# Patient Record
Sex: Female | Born: 1955
Health system: Southern US, Community
[De-identification: ages and names within clinical notes are randomized; demographics above are authoritative.]

## PROBLEM LIST (undated history)

## (undated) DIAGNOSIS — G4730 Sleep apnea, unspecified: Secondary | ICD-10-CM

## (undated) DIAGNOSIS — IMO0001 Reserved for inherently not codable concepts without codable children: Secondary | ICD-10-CM

## (undated) DIAGNOSIS — G629 Polyneuropathy, unspecified: Secondary | ICD-10-CM

## (undated) DIAGNOSIS — Z9889 Other specified postprocedural states: Secondary | ICD-10-CM

## (undated) DIAGNOSIS — J45909 Unspecified asthma, uncomplicated: Secondary | ICD-10-CM

## (undated) DIAGNOSIS — K219 Gastro-esophageal reflux disease without esophagitis: Secondary | ICD-10-CM

## (undated) DIAGNOSIS — E669 Obesity, unspecified: Secondary | ICD-10-CM

## (undated) DIAGNOSIS — R112 Nausea with vomiting, unspecified: Secondary | ICD-10-CM

## (undated) DIAGNOSIS — D649 Anemia, unspecified: Secondary | ICD-10-CM

## (undated) DIAGNOSIS — E785 Hyperlipidemia, unspecified: Secondary | ICD-10-CM

## (undated) DIAGNOSIS — J40 Bronchitis, not specified as acute or chronic: Secondary | ICD-10-CM

## (undated) DIAGNOSIS — H3530 Unspecified macular degeneration: Secondary | ICD-10-CM

## (undated) DIAGNOSIS — J309 Allergic rhinitis, unspecified: Secondary | ICD-10-CM

## (undated) DIAGNOSIS — R109 Unspecified abdominal pain: Secondary | ICD-10-CM

## (undated) DIAGNOSIS — F32A Depression, unspecified: Secondary | ICD-10-CM

## (undated) DIAGNOSIS — I10 Essential (primary) hypertension: Secondary | ICD-10-CM

## (undated) DIAGNOSIS — M1990 Unspecified osteoarthritis, unspecified site: Secondary | ICD-10-CM

## (undated) DIAGNOSIS — F419 Anxiety disorder, unspecified: Secondary | ICD-10-CM

## (undated) HISTORY — PX: HAMMER TOE SURGERY: SHX385

## (undated) HISTORY — DX: Allergic rhinitis, unspecified: J30.9

## (undated) HISTORY — DX: Sleep apnea, unspecified: G47.30

## (undated) HISTORY — DX: Obesity, unspecified: E66.9

## (undated) HISTORY — DX: Anxiety disorder, unspecified: F41.9

## (undated) HISTORY — PX: ABDOMINAL HYSTERECTOMY: SHX81

## (undated) HISTORY — DX: Anemia, unspecified: D64.9

## (undated) HISTORY — PX: JOINT REPLACEMENT: SHX530

## (undated) HISTORY — PX: BREAST REDUCTION SURGERY: SHX8

## (undated) HISTORY — DX: Bronchitis, not specified as acute or chronic: J40

## (undated) HISTORY — PX: NOSE SURGERY: SHX723

## (undated) HISTORY — DX: Unspecified abdominal pain: R10.9

## (undated) HISTORY — DX: Hyperlipidemia, unspecified: E78.5

## (undated) HISTORY — PX: OTHER SURGICAL HISTORY: SHX169

## (undated) HISTORY — DX: Gastro-esophageal reflux disease without esophagitis: K21.9

## (undated) HISTORY — DX: Polyneuropathy, unspecified: G62.9

---

## 1998-01-07 ENCOUNTER — Encounter: Payer: Self-pay | Admitting: *Deleted

## 1998-01-07 ENCOUNTER — Ambulatory Visit (HOSPITAL_COMMUNITY): Admission: RE | Admit: 1998-01-07 | Discharge: 1998-01-07 | Payer: Self-pay | Admitting: *Deleted

## 1999-04-12 ENCOUNTER — Emergency Department (HOSPITAL_COMMUNITY): Admission: EM | Admit: 1999-04-12 | Discharge: 1999-04-12 | Payer: Self-pay

## 1999-04-13 ENCOUNTER — Emergency Department (HOSPITAL_COMMUNITY): Admission: EM | Admit: 1999-04-13 | Discharge: 1999-04-13 | Payer: Self-pay | Admitting: Emergency Medicine

## 1999-04-14 ENCOUNTER — Emergency Department (HOSPITAL_COMMUNITY): Admission: EM | Admit: 1999-04-14 | Discharge: 1999-04-14 | Payer: Self-pay

## 1999-07-30 ENCOUNTER — Other Ambulatory Visit: Admission: RE | Admit: 1999-07-30 | Discharge: 1999-07-30 | Payer: Self-pay | Admitting: Obstetrics & Gynecology

## 2001-03-28 ENCOUNTER — Emergency Department (HOSPITAL_COMMUNITY): Admission: EM | Admit: 2001-03-28 | Discharge: 2001-03-28 | Payer: Self-pay | Admitting: Emergency Medicine

## 2001-03-28 ENCOUNTER — Encounter: Payer: Self-pay | Admitting: Emergency Medicine

## 2003-12-06 ENCOUNTER — Ambulatory Visit (HOSPITAL_COMMUNITY): Admission: RE | Admit: 2003-12-06 | Discharge: 2003-12-06 | Payer: Self-pay | Admitting: Gastroenterology

## 2004-11-10 ENCOUNTER — Other Ambulatory Visit: Admission: RE | Admit: 2004-11-10 | Discharge: 2004-11-10 | Payer: Self-pay | Admitting: Obstetrics & Gynecology

## 2004-11-25 ENCOUNTER — Encounter: Admission: RE | Admit: 2004-11-25 | Discharge: 2004-11-25 | Payer: Self-pay | Admitting: Orthopedic Surgery

## 2008-10-17 ENCOUNTER — Encounter: Payer: Self-pay | Admitting: Pulmonary Disease

## 2009-07-10 ENCOUNTER — Encounter: Payer: Self-pay | Admitting: Pulmonary Disease

## 2009-08-19 DIAGNOSIS — I10 Essential (primary) hypertension: Secondary | ICD-10-CM | POA: Insufficient documentation

## 2009-08-19 DIAGNOSIS — K219 Gastro-esophageal reflux disease without esophagitis: Secondary | ICD-10-CM | POA: Insufficient documentation

## 2009-08-19 DIAGNOSIS — M545 Low back pain, unspecified: Secondary | ICD-10-CM | POA: Insufficient documentation

## 2009-08-19 DIAGNOSIS — M542 Cervicalgia: Secondary | ICD-10-CM | POA: Insufficient documentation

## 2009-08-19 DIAGNOSIS — E785 Hyperlipidemia, unspecified: Secondary | ICD-10-CM | POA: Insufficient documentation

## 2009-08-19 DIAGNOSIS — I110 Hypertensive heart disease with heart failure: Secondary | ICD-10-CM | POA: Insufficient documentation

## 2009-08-19 DIAGNOSIS — E119 Type 2 diabetes mellitus without complications: Secondary | ICD-10-CM | POA: Insufficient documentation

## 2009-08-19 DIAGNOSIS — L299 Pruritus, unspecified: Secondary | ICD-10-CM | POA: Insufficient documentation

## 2009-08-19 DIAGNOSIS — D649 Anemia, unspecified: Secondary | ICD-10-CM | POA: Insufficient documentation

## 2009-08-19 DIAGNOSIS — F411 Generalized anxiety disorder: Secondary | ICD-10-CM | POA: Insufficient documentation

## 2009-08-20 ENCOUNTER — Ambulatory Visit: Payer: Self-pay | Admitting: Pulmonary Disease

## 2009-08-20 DIAGNOSIS — J328 Other chronic sinusitis: Secondary | ICD-10-CM | POA: Insufficient documentation

## 2009-08-21 ENCOUNTER — Ambulatory Visit: Payer: Self-pay | Admitting: Internal Medicine

## 2009-09-01 ENCOUNTER — Telehealth (INDEPENDENT_AMBULATORY_CARE_PROVIDER_SITE_OTHER): Payer: Self-pay | Admitting: *Deleted

## 2009-09-11 ENCOUNTER — Ambulatory Visit: Payer: Self-pay | Admitting: Pulmonary Disease

## 2009-09-17 ENCOUNTER — Encounter: Payer: Self-pay | Admitting: Pulmonary Disease

## 2009-12-08 ENCOUNTER — Encounter: Payer: Self-pay | Admitting: Pulmonary Disease

## 2010-04-14 NOTE — Assessment & Plan Note (Signed)
Summary: rov for ongoing sinus issues   Copy to:  [facility] Primary Provider/Referring Provider:  Elvera Lennox  CC:  Pt is here for a routine f/u appt.   Pt states she is feeling " no better" from last visit.  Pt still c/o head congestion and PND.  Pt denied sore throat and headache or cough.  .  History of Present Illness: the pt comes in today for f/u of her upper airway cough with ongoing nasal symptoms.  At the last visit, she had a cxr and ct sinuses that were unremarkable.  She was asked to work on a nasal hygiene regimen, and to followup to check on her progress.  She comes in today where she continues to have nasal and head congestion, and postnasal drip.  She denies any cough currently.  Current Medications (verified): 1)  Multivitamins  Tabs (Multiple Vitamin) .... Take 1 Tablet By Mouth Once A Day 2)  Norvasc 10 Mg Tabs (Amlodipine Besylate) .... Take 1 Tablet By Mouth Once A Day 3)  Lipitor 40 Mg Tabs (Atorvastatin Calcium) .... Take 1 Tablet By Mouth Once A Day 4)  Singulair 10 Mg Tabs (Montelukast Sodium) .... Take 1 Tablet By Mouth Once A Day As Needed 5)  Diclofenac Sodium 75 Mg Tbec (Diclofenac Sodium) .... Take 1 Tablet By Mouth Two Times A Day As Needed 6)  Prevacid 30 Mg Cpdr (Lansoprazole) .... Take 1 Tablet By Mouth Once A Day 7)  Lexapro 10 Mg Tabs (Escitalopram Oxalate) .... Take 1 Tablet By Mouth Once A Day 8)  Astepro 0.15 % Soln (Azelastine Hcl) .... As Directed 9)  Chlorpheniramine Maleate 4 Mg Tabs (Chlorpheniramine Maleate) .... Take 2 Tabs By Mouth At Bedtime 10)  Neilmed Sinus Rinses .... Use Two Times A Day  Allergies (verified): No Known Drug Allergies  Review of Systems       The patient complains of nasal congestion/difficulty breathing through nose.  The patient denies shortness of breath with activity, shortness of breath at rest, productive cough, non-productive cough, coughing up blood, chest pain, irregular heartbeats, acid heartburn, indigestion,  loss of appetite, weight change, abdominal pain, difficulty swallowing, sore throat, tooth/dental problems, headaches, sneezing, itching, ear ache, anxiety, depression, hand/feet swelling, joint stiffness or pain, rash, change in color of mucus, and fever.    Vital Signs:  Patient profile:   55 year old female Height:      71 inches Weight:      317 pounds BMI:     44.37 O2 Sat:      97 % on Room air Temp:     99.0 degrees F oral Pulse rate:   90 / minute BP sitting:   122 / 70  (left arm) Cuff size:   large  Vitals Entered By: Arman Filter LPN (September 11, 2009 9:17 AM)  O2 Flow:  Room air CC: Pt is here for a routine f/u appt.   Pt states she is feeling " no better" from last visit.  Pt still c/o head congestion and PND.  Pt denied sore throat, headache or cough.   Comments Medications reviewed with patient Arman Filter LPN  September 11, 2009 9:17 AM    Physical Exam  General:  obese female in nad very nasal voice with sniffing Nose:  enlarged turbs, inflamm changes on left with mild bleeding. Mouth:  clear Lungs:  totally clear to auscultation Heart:  rrr, no mrg Extremities:  no edema or cyanosis Neurologic:  alert and oriented, moves  all 4   Impression & Recommendations:  Problem # 1:  RHINOSINUSITIS, CHRONIC (ICD-473.8) the pt continues to have chronic nasal congestion with postnasal drip.  Surprisingly, her ct showed no sinusitis, and she only had partial response to a regimen of chlorpheniramine/astelin/neil med rinses.  She has had allergy testing with was unremarkable.  At this point, I wonder if she has vasomotor rhinitis superimposed over structural issues?  I think she needs ENT evaluation, and will make the referral.  I really do not see an ongoing pulmonary issue here.  Medications Added to Medication List This Visit: 1)  Chlorpheniramine Maleate 4 Mg Tabs (Chlorpheniramine maleate) .... Take 2 tabs by mouth at bedtime 2)  Neilmed Sinus Rinses  .... Use two times  a day  Other Orders: Est. Patient Level III (13086) ENT Referral (ENT)  Patient Instructions: 1)  continue on chlorpheniramine at bedtime as before, along with neilmed rinses. 2)  will refer to ENT for evaluation of your nasal passages and sinuses. 3)  followup with me as needed.   Immunization History:  Influenza Immunization History:    Influenza:  historical (12/13/2008)  Pneumovax Immunization History:    Pneumovax:  historical (03/15/2006)

## 2010-04-14 NOTE — Letter (Signed)
Summary: [facility]  [facility]   Imported By: Sherian Rein 01/19/2010 12:39:16  _____________________________________________________________________  External Attachment:    Type:   Image     Comment:   External Document

## 2010-04-14 NOTE — Progress Notes (Signed)
Summary: rx  Phone Note Call from Patient Call back at Work Phone [phone]   Caller: Patient Call For: clance Reason for Call: Talk to Nurse Summary of Call: pt coughing, post nasal drip.  Can she get an antibiotic called in? [facility] [place] Initial call taken by: Eugene Gavia,  September 01, 2009 9:53 AM  Follow-up for Phone Call        Spoke with pt.  Pt c/o sinus congstion, yellow mucus, PND, nonprod cough at night, and fever on Friday and Saturday.  Pt requesting abx to be called in because she was just seen on 08/20/09 and has pending appt on 09/11/09 with [facility]. NKDA,  [facility].   Will forward message to KC-pls advise.  Thanks!  Gweneth Dimitri RN  September 01, 2009 10:38 AM   Additional Follow-up for Phone Call Additional follow up Details #1::        she had a ct scan of her sinuses just 10 days ago that was completely clear....very unlikely she has a sinus infection in this short period of time.  Make sure she is doing her rinses am and pm, using her nasal spray, and taking her chlorpheniramine at hs.  last visit, she was not compliant with her medications If she says she is being compliant (see d/c instructions last visit), we can call in short prednisone burst....let me know. Additional Follow-up by: Barbaraann Share MD,  September 01, 2009 4:37 PM    Additional Follow-up for Phone Call Additional follow up Details #2::    Spoke with pt and notified of the above recs per [facility].  Pt states that she is taking the chlopheneramine at hs, nasal saline rinses two times a day and is also taking the astepro as directed.  Please advise directions for pred taper, thanks! Follow-up by: Vernie Murders,  September 01, 2009 4:44 PM  Additional Follow-up for Phone Call Additional follow up Details #3:: Details for Additional Follow-up Action Taken: prednisone 20mg  2 once daily for 2 days, then 1 1/2 once daily for 2 days, then 1 once daily for 2 days,then 1/2 once daily for 2 days,then stop.  if issues  continue, let her know will send to ent for evaluation.  Rx for prednisone taper was sent to pharm and pt aware of this and also aware that if continues to have issues will need ENT referral. Vernie Murders  September 01, 2009 4:56 PM  Additional Follow-up by: Barbaraann Share MD,  September 01, 2009 4:52 PM  New/Updated Medications: PREDNISONE 20 MG TABS (PREDNISONE) 2 each am x 2 days, 1 1/2 each am x 2 days, 1 x 2 days, then 1/2 x 2 days, then stop. Prescriptions: PREDNISONE 20 MG TABS (PREDNISONE) 2 each am x 2 days, 1 1/2 each am x 2 days, 1 x 2 days, then 1/2 x 2 days, then stop.  #10 x 0   Entered by:   Vernie Murders   Authorized by:   Barbaraann Share MD   Signed by:   Vernie Murders on 09/01/2009   Method used:   [facility]        [facility] [phone]* (retail)       [address]       [place], [place]  [address]       Ph: [phone] or [phone]       Fax: [phone]   RxID:   [id number]

## 2010-04-14 NOTE — Letter (Signed)
[facility]   Imported By: Lester Graham 08/26/2009 13:45:41  _____________________________________________________________________  External Attachment:    Type:   Image     Comment:   External Document

## 2010-04-14 NOTE — Consult Note (Signed)
Summary: [facility]  [facility]   Imported By: Sherian Rein 12/03/2009 08:56:52  _____________________________________________________________________  External Attachment:    Type:   Image     Comment:   External Document

## 2010-04-14 NOTE — Assessment & Plan Note (Signed)
Summary: consult for cough, rhinosinusitis   Copy to:  [facility] Primary Provider/Referring Provider:  Elvera Lennox  CC:  Pulmonary Consult.  History of Present Illness: The pt is a 55y/o female who I have been asked to see for persistent cough and sinus congestion.  The pt has had issues with cough off and on, but today tells me this has totally resolved since her "blood pressure medication has been changed".  She is having persistent nasal congestion and rhinorrhea, and tells me that she has recurrent episodes of "bronchitis".  She is currently blowing purulent material from nares.  She has not seen improvement with singulair, nasonex, or astelin, but admits she doesn't take them consistently enough to see if they will help.  She has had an allergy evaluation, with only minimal reactivity to dust mite and weed mixture.  Attempts have been made to do spirometry, but the pt has poor test performance.  She also has a h/o GERD and takes prevacid, and only has rare breakthru.  She has doe with more moderate to heavy activities, but no issues with ADL's.    Preventive Screening-Counseling & Management  Alcohol-Tobacco     Smoking Status: quit  Current Medications (verified): 1)  Multivitamins  Tabs (Multiple Vitamin) .... Take 1 Tablet By Mouth Once A Day 2)  Norvasc 10 Mg Tabs (Amlodipine Besylate) .... Take 1 Tablet By Mouth Once A Day 3)  Lipitor 40 Mg Tabs (Atorvastatin Calcium) .... Take 1 Tablet By Mouth Once A Day 4)  Singulair 10 Mg Tabs (Montelukast Sodium) .... Take 1 Tablet By Mouth Once A Day As Needed 5)  Diclofenac Sodium 75 Mg Tbec (Diclofenac Sodium) .... Take 1 Tablet By Mouth Two Times A Day As Needed 6)  Prevacid 30 Mg Cpdr (Lansoprazole) .... Take 1 Tablet By Mouth Once A Day 7)  Lexapro 10 Mg Tabs (Escitalopram Oxalate) .... Take 1 Tablet By Mouth Once A Day  Allergies (verified): No Known Drug Allergies  Past History:  Past Medical History:  ANEMIA  (ICD-285.9) DIABETES MELLITUS (ICD-250.00) CERVICALGIA (ICD-723.1) LOW BACK PAIN, CHRONIC (ICD-724.2) HYPERLIPIDEMIA (ICD-272.4) GERD (ICD-530.81) HYPERTENSION (ICD-401.9) ANXIETY (ICD-300.00) PRURITUS (ICD-698.9)    Past Surgical History: hysterectomy at age 73 breast reduction age 32s L knee surgery 48s  Family History: Reviewed history and no changes required. allergies: mother  Social History: Reviewed history and no changes required. Patient states former smoker.   started at age 20.  less than 1 ppd.  quit 2008. pt is married to Keyser. pt has no children. pt works as an Investment banker, corporate for [employer] Smoking Status:  quit  Review of Systems       The patient complains of shortness of breath with activity, productive cough, nasal congestion/difficulty breathing through nose, sneezing, hand/feet swelling, and change in color of mucus.  The patient denies shortness of breath at rest, non-productive cough, coughing up blood, chest pain, irregular heartbeats, acid heartburn, indigestion, loss of appetite, weight change, abdominal pain, difficulty swallowing, sore throat, tooth/dental problems, headaches, itching, ear ache, anxiety, depression, joint stiffness or pain, rash, and fever.    Vital Signs:  Patient profile:   55 year old female Height:      71 inches Weight:      328 pounds BMI:     45.91 O2 Sat:      93 % on Room air Temp:     98.9 degrees F oral Pulse rate:   96 / minute BP sitting:   124 /  80  (left arm) Cuff size:   regular  Vitals Entered By: Arman Filter LPN (August 20, 1608 3:35 PM)  O2 Flow:  Room air CC: Pulmonary Consult Comments Medications reviewed with patient Arman Filter LPN  August 21, 9602 3:45 PM    Physical Exam  General:  obese female in nad Eyes:  PERRLA and EOMI.   Nose:  swollen turbinates, inflammed mucosa, bleeding and crusting noted in passageway Mouth:  elongation of soft palate and uvula, otherwise  clear Neck:  no jvd, tmg, LN Lungs:  totally clear to auscultation, no wheezing or rhonchi Heart:  rrr, no mrg Abdomen:  soft and nontender, bs+ Extremities:  mild edema, no cyanosis pulses intake distally Neurologic:  alert and oriented, moves all 4.   Impression & Recommendations:  Problem # 1:  RHINOSINUSITIS, CHRONIC (ICD-473.8) the pt is having chronic nasal congestion with purulence, postnasal drip, and waxing and waning cough that has been unresponsive to abx, steroids, and an allergy regimen for rhinitis.  I am wondering  with her recurrent "bronchitis", if she actually has chronic sinusitis that will require broader abx treatment and for a longer period of time.  I will check cxr and scan of sinuses to evaluate for this.  I also would consider whether LPR could be playing a role in all of this?  I cannot 100% exclude the possibility of asthma (she was not able to perform spirometry), but there is nothing by history or exam to support this.  Medications Added to Medication List This Visit: 1)  Norvasc 10 Mg Tabs (Amlodipine besylate) .... Take 1 tablet by mouth once a day 2)  Singulair 10 Mg Tabs (Montelukast sodium) .... Take 1 tablet by mouth once a day as needed 3)  Diclofenac Sodium 75 Mg Tbec (Diclofenac sodium) .... Take 1 tablet by mouth two times a day as needed 4)  Astepro 0.15 % Soln (Azelastine hcl) .... As directed  Other Orders: Consultation Level IV (54098) Radiology Referral (Radiology) T-2 View CXR (71020TC)  Patient Instructions: 1)  neilmed sinus rinses am and pm 2)  stop singulair 3)  astepro 2 sprays each nostril each am, after your rinses. 4)  will check xray of sinuses to make sure you do not have sinusitis. 5)  will check cxr today. 6)  take chlorpheniramine 8mg  at bedtime each night. 7)  will arrange for followup apptm once resuts back.  Prescriptions: ASTEPRO 0.15 % SOLN (AZELASTINE HCL) as directed  #1 x 6   Entered and Authorized by:   Barbaraann Share MD   Signed by:   Barbaraann Share MD on 08/20/2009   Method used:   Print then Give to Patient   RxID:   [id number]     CardioPerfect Spirometry  ID: [id number] Patient: Alexandria Ruiz DOB: [birth year] Age: 55 Years Old Sex: Female Race: Black Physician: Bo Teicher Height: 71 Weight: 328 Status: Unconfirmed Past Medical History:  Current Problems:  ANEMIA (ICD-285.9) DIABETES MELLITUS (ICD-250.00) CERVICALGIA (ICD-723.1) LOW BACK PAIN, CHRONIC (ICD-724.2) HYPERLIPIDEMIA (ICD-272.4) GERD (ICD-530.81) HYPERTENSION (ICD-401.9) ANXIETY (ICD-300.00) PRURITUS (ICD-698.9)   Recorded: 08/20/2009 4:27 PM  Parameter  Measured Predicted %Predicted FVC     3.21        3.69        87 FEV1     1.76        2.95        59.50 FEV1%   54.71        81.26  67.30 PEF    3.33        7.32        45.50   Interpretation: results are unreliable and uninterpretable poor test performance

## 2010-04-14 NOTE — Miscellaneous (Signed)
Summary[facility]  [facility]   Imported By: Lester Burkburnett 08/26/2009 13:47:42  _____________________________________________________________________  External Attachment:    Type:   Image     Comment:   External Document

## 2010-04-14 NOTE — Letter (Signed)
[facility]: [facility]  [facility]   Imported By: Lester [place] 08/26/2009 13:44:03  _____________________________________________________________________  External Attachment:    Type:   Image     Comment:   External Document

## 2010-07-31 NOTE — Op Note (Signed)
NAMEKAMICA, FLORANCE            ACCOUNT NO.:  [id number]   MEDICAL RECORD NO.:  [id number]          PATIENT TYPE:  AMB   LOCATION:  ENDO                         FACILITY:  [facility]   PHYSICIAN:  John C. Madilyn Fireman, M.D.    DATE OF BIRTH:  August 27, 1955   DATE OF PROCEDURE:  12/06/2003  DATE OF DISCHARGE:                                 OPERATIVE REPORT   PROCEDURE:  Esophagogastroduodenoscopy.   INDICATION FOR PROCEDURE:  Chronic reflux symptoms refractory to double-dose  proton pump inhibitor.   DESCRIPTION OF PROCEDURE:  The patient was placed in the left lateral  decubitus position and placed on the pulse monitor with continuous low-flow  oxygen delivered by nasal cannula.  She was sedated with 75 mcg IV fentanyl  and 6 mg IV Versed.  The Olympus video endoscope was advanced under direct  vision into the oropharynx and esophagus.  The esophagus was straight and of  normal caliber with the squamocolumnar line at 38 cm above a 2 cm sliding  hiatal hernia.  The GE junction appeared somewhat patulous, and there was  free reflux noted during the procedure.  There were no visible erosions and  no esophageal ulcer, ring, stricture, or other abnormality of the GE  junction.  The stomach was entered, and a small amount of liquid secretions  were suctioned from the fundus.  Retroflexed view of the cardia confirmed a  hiatal hernia and was otherwise unremarkable.  The fundus, body, antrum, and  pylorus all appeared normal.  The duodenum was entered, and both the bulb  and second portion were well-inspected and appeared to be within normal  limits.  The scope was then withdrawn, and the patient returned to the  recovery room in stable condition.  She tolerated the procedure well, and  there were no immediate complications.   IMPRESSION:  1.  Patulous gastroesophageal junction with 2 cm hiatal hernia.  2.  Otherwise, normal study.   PLAN:  Optimize dietary and lifestyle modification for reflux  with continued  double dose proton pump inhibitor and emphasis on weight loss.  She would be  a reasonable candidate for antireflux surgery, but an earnest trial  attempted weight loss would be preferred first.      JCH/MEDQ  D:  12/06/2003  T:  12/06/2003  Job:  [id number]   cc:   Gerrit Friends. Aldona Bar, M.D.  [address]  [place]  [place] [address]  Fax: [phone]

## 2011-06-29 ENCOUNTER — Encounter (HOSPITAL_COMMUNITY)
Admission: EM | Disposition: A | Discharge status: Home / Self Care | Payer: Self-pay | Source: Home / Self Care | Attending: Orthopedic Surgery

## 2011-06-29 ENCOUNTER — Inpatient Hospital Stay (HOSPITAL_COMMUNITY)
Admission: EM | Admit: 2011-06-29 | Discharge: 2011-07-02 | DRG: 493 | Disposition: A | Discharge status: Home / Self Care | Payer: 59 | Attending: Orthopedic Surgery | Admitting: Orthopedic Surgery

## 2011-06-29 ENCOUNTER — Other Ambulatory Visit: Payer: Self-pay | Admitting: Orthopedic Surgery

## 2011-06-29 ENCOUNTER — Emergency Department (HOSPITAL_COMMUNITY): Payer: 59 | Admitting: Anesthesiology

## 2011-06-29 ENCOUNTER — Encounter (HOSPITAL_COMMUNITY): Payer: Self-pay | Admitting: Anesthesiology

## 2011-06-29 ENCOUNTER — Inpatient Hospital Stay: Admit: 2011-06-29 | Payer: 59 | Admitting: Orthopedic Surgery

## 2011-06-29 ENCOUNTER — Emergency Department (HOSPITAL_COMMUNITY): Payer: 59

## 2011-06-29 ENCOUNTER — Encounter (HOSPITAL_COMMUNITY): Payer: Self-pay

## 2011-06-29 DIAGNOSIS — M545 Low back pain, unspecified: Secondary | ICD-10-CM | POA: Diagnosis present

## 2011-06-29 DIAGNOSIS — K219 Gastro-esophageal reflux disease without esophagitis: Secondary | ICD-10-CM | POA: Diagnosis present

## 2011-06-29 DIAGNOSIS — S82899A Other fracture of unspecified lower leg, initial encounter for closed fracture: Secondary | ICD-10-CM

## 2011-06-29 DIAGNOSIS — E119 Type 2 diabetes mellitus without complications: Secondary | ICD-10-CM | POA: Diagnosis present

## 2011-06-29 DIAGNOSIS — S93429A Sprain of deltoid ligament of unspecified ankle, initial encounter: Secondary | ICD-10-CM | POA: Diagnosis present

## 2011-06-29 DIAGNOSIS — E785 Hyperlipidemia, unspecified: Secondary | ICD-10-CM | POA: Diagnosis present

## 2011-06-29 DIAGNOSIS — S82891A Other fracture of right lower leg, initial encounter for closed fracture: Secondary | ICD-10-CM | POA: Diagnosis present

## 2011-06-29 DIAGNOSIS — G4733 Obstructive sleep apnea (adult) (pediatric): Secondary | ICD-10-CM | POA: Diagnosis present

## 2011-06-29 DIAGNOSIS — D649 Anemia, unspecified: Secondary | ICD-10-CM | POA: Diagnosis present

## 2011-06-29 DIAGNOSIS — Y92009 Unspecified place in unspecified non-institutional (private) residence as the place of occurrence of the external cause: Secondary | ICD-10-CM

## 2011-06-29 DIAGNOSIS — I10 Essential (primary) hypertension: Secondary | ICD-10-CM | POA: Diagnosis present

## 2011-06-29 DIAGNOSIS — D62 Acute posthemorrhagic anemia: Secondary | ICD-10-CM | POA: Diagnosis not present

## 2011-06-29 DIAGNOSIS — W010XXA Fall on same level from slipping, tripping and stumbling without subsequent striking against object, initial encounter: Secondary | ICD-10-CM | POA: Diagnosis present

## 2011-06-29 DIAGNOSIS — S8263XA Displaced fracture of lateral malleolus of unspecified fibula, initial encounter for closed fracture: Secondary | ICD-10-CM | POA: Diagnosis present

## 2011-06-29 DIAGNOSIS — S92109A Unspecified fracture of unspecified talus, initial encounter for closed fracture: Principal | ICD-10-CM | POA: Diagnosis present

## 2011-06-29 HISTORY — DX: Other specified postprocedural states: Z98.890

## 2011-06-29 HISTORY — DX: Nausea with vomiting, unspecified: R11.2

## 2011-06-29 HISTORY — PX: ORIF ANKLE FRACTURE: SHX5408

## 2011-06-29 HISTORY — DX: Essential (primary) hypertension: I10

## 2011-06-29 LAB — BASIC METABOLIC PANEL
BUN: 15 mg/dL (ref 6–23)
CO2: 25 mEq/L (ref 19–32)
Calcium: 10.1 mg/dL (ref 8.4–10.5)
Chloride: 100 mEq/L (ref 96–112)
Creatinine, Ser: 0.64 mg/dL (ref 0.50–1.10)
GFR calc Af Amer: >90 mL/min (ref >90)
GFR calc non Af Amer: >90 mL/min (ref >90)
Glucose, Bld: 93 mg/dL (ref 70–99)
Potassium: 3.5 mEq/L (ref 3.5–5.1)
Sodium: 139 mEq/L (ref 135–145)

## 2011-06-29 LAB — CBC
HCT: 33.8 % — ABNORMAL LOW (ref 36.0–46.0)
Hemoglobin: 10.4 g/dL — ABNORMAL LOW (ref 12.0–15.0)
MCH: 26 pg (ref 26.0–34.0)
MCHC: 30.8 g/dL (ref 30.0–36.0)
MCV: 84.5 fL (ref 78.0–100.0)
Platelets: 465 10*3/uL — ABNORMAL HIGH (ref 150–400)
RBC: 4 MIL/uL (ref 3.87–5.11)
RDW: 15.9 % — ABNORMAL HIGH (ref 11.5–15.5)
WBC: 9.6 10*3/uL (ref 4.0–10.5)

## 2011-06-29 LAB — GLUCOSE, CAPILLARY: Glucose-Capillary: 154 mg/dL — ABNORMAL HIGH (ref 70–99)

## 2011-06-29 SURGERY — OPEN REDUCTION INTERNAL FIXATION (ORIF) ANKLE FRACTURE
Anesthesia: General | Site: Ankle | Laterality: Right | Wound class: Clean

## 2011-06-29 SURGERY — OPEN REDUCTION INTERNAL FIXATION (ORIF) ANKLE FRACTURE
Anesthesia: Choice | Laterality: Right

## 2011-06-29 MED ORDER — HYDROMORPHONE 0.3 MG/ML IV SOLN
INTRAVENOUS | Status: AC
  Filled 2011-06-29: qty 25

## 2011-06-29 MED ORDER — ONDANSETRON HCL 4 MG/2ML IJ SOLN
4.0000 mg | Freq: Four times a day (QID) | INTRAMUSCULAR | Status: DC | PRN
  Administered 2011-06-30 – 2011-07-01 (×6): 4 mg via INTRAVENOUS
  Filled 2011-06-29: qty 2

## 2011-06-29 MED ORDER — FENTANYL CITRATE 0.05 MG/ML IJ SOLN: INTRAMUSCULAR | Status: DC | PRN

## 2011-06-29 MED ORDER — MIDAZOLAM HCL 5 MG/5ML IJ SOLN
INTRAMUSCULAR | Status: DC | PRN
  Administered 2011-06-29: 1.5 mg via INTRAVENOUS
  Administered 2011-06-29: 1 mg via INTRAVENOUS
  Administered 2011-06-29: 0.5 mg via INTRAVENOUS
  Administered 2011-06-29: 1 mg via INTRAVENOUS

## 2011-06-29 MED ORDER — HYDROMORPHONE HCL PF 2 MG/ML IJ SOLN
INTRAMUSCULAR | Status: AC
  Administered 2011-06-29: 1 mg
  Filled 2011-06-29: qty 1

## 2011-06-29 MED ORDER — SODIUM CHLORIDE 0.9 % IV SOLN
INTRAVENOUS | Status: DC
  Administered 2011-06-29: 23:00:00 via INTRAVENOUS

## 2011-06-29 MED ORDER — DROPERIDOL 2.5 MG/ML IJ SOLN
INTRAMUSCULAR | Status: DC | PRN
  Administered 2011-06-29: 0.625 mg via INTRAVENOUS

## 2011-06-29 MED ORDER — LIDOCAINE HCL (CARDIAC) 20 MG/ML IV SOLN
INTRAVENOUS | Status: DC | PRN
  Administered 2011-06-29: 50 mg via INTRAVENOUS

## 2011-06-29 MED ORDER — ZOLPIDEM TARTRATE 5 MG PO TABS: 5.0000 mg | ORAL_TABLET | Freq: Every evening | ORAL | Status: DC | PRN

## 2011-06-29 MED ORDER — MIDAZOLAM HCL 2 MG/2ML IJ SOLN: 1.0000 mg | INTRAMUSCULAR | Status: DC | PRN

## 2011-06-29 MED ORDER — PROPOFOL 10 MG/ML IV EMUL
INTRAVENOUS | Status: AC | PRN
  Administered 2011-06-29: 30 mg/h via INTRAVENOUS

## 2011-06-29 MED ORDER — SUFENTANIL CITRATE 50 MCG/ML IV SOLN
INTRAVENOUS | Status: DC | PRN
  Administered 2011-06-29 (×2): 5 ug via INTRAVENOUS
  Administered 2011-06-29 (×2): 10 ug via INTRAVENOUS

## 2011-06-29 MED ORDER — LACTATED RINGERS IV SOLN: INTRAVENOUS | Status: DC

## 2011-06-29 MED ORDER — KETOROLAC TROMETHAMINE 15 MG/ML IJ SOLN
15.0000 mg | Freq: Once | INTRAMUSCULAR | Status: AC
  Administered 2011-06-29: 15 mg via INTRAVENOUS
  Filled 2011-06-29 (×2): qty 1

## 2011-06-29 MED ORDER — CHLORHEXIDINE GLUCONATE 4 % EX LIQD
60.0000 mL | Freq: Once | CUTANEOUS | Status: AC
  Administered 2011-06-29: 4 via TOPICAL
  Filled 2011-06-29: qty 60

## 2011-06-29 MED ORDER — CEFAZOLIN SODIUM-DEXTROSE 2-3 GM-% IV SOLR
INTRAVENOUS | Status: AC
  Filled 2011-06-29: qty 50

## 2011-06-29 MED ORDER — METOCLOPRAMIDE HCL 5 MG/ML IJ SOLN
INTRAMUSCULAR | Status: DC | PRN
  Administered 2011-06-29: 10 mg via INTRAVENOUS

## 2011-06-29 MED ORDER — PROPOFOL 10 MG/ML IV EMUL
20.0000 mL | Freq: Once | INTRAVENOUS | Status: AC
  Administered 2011-06-29: 30 mg via INTRAVENOUS
  Filled 2011-06-29: qty 20

## 2011-06-29 MED ORDER — LACTATED RINGERS IV SOLN
INTRAVENOUS | Status: DC | PRN
  Administered 2011-06-29 (×2): via INTRAVENOUS

## 2011-06-29 MED ORDER — DIPHENHYDRAMINE HCL 50 MG/ML IJ SOLN: 12.5000 mg | Freq: Four times a day (QID) | INTRAMUSCULAR | Status: DC | PRN

## 2011-06-29 MED ORDER — GLYCOPYRROLATE 0.2 MG/ML IJ SOLN
INTRAMUSCULAR | Status: DC | PRN
  Administered 2011-06-29: 0.2 mg via INTRAVENOUS

## 2011-06-29 MED ORDER — DIPHENHYDRAMINE HCL 12.5 MG/5ML PO ELIX
12.5000 mg | ORAL_SOLUTION | Freq: Four times a day (QID) | ORAL | Status: DC | PRN
Start: 2011-06-29 — End: 2011-07-02

## 2011-06-29 MED ORDER — ACETAMINOPHEN 10 MG/ML IV SOLN
1000.0000 mg | Freq: Four times a day (QID) | INTRAVENOUS | Status: DC
  Administered 2011-06-29: 1000 mg via INTRAVENOUS
  Filled 2011-06-29 (×4): qty 100

## 2011-06-29 MED ORDER — PROPOFOL 10 MG/ML IV EMUL
INTRAVENOUS | Status: DC | PRN
  Administered 2011-06-29: 50 mg via INTRAVENOUS
  Administered 2011-06-29: 150 mg via INTRAVENOUS
  Administered 2011-06-29: 200 mg via INTRAVENOUS

## 2011-06-29 MED ORDER — LABETALOL HCL 5 MG/ML IV SOLN
INTRAVENOUS | Status: DC | PRN
  Administered 2011-06-29: 2.5 mg via INTRAVENOUS
  Administered 2011-06-29: 5 mg via INTRAVENOUS

## 2011-06-29 MED ORDER — HETASTARCH-ELECTROLYTES 6 % IV SOLN
INTRAVENOUS | Status: DC | PRN
  Administered 2011-06-29: 19:00:00 via INTRAVENOUS

## 2011-06-29 MED ORDER — PHENYLEPHRINE HCL 10 MG/ML IJ SOLN
INTRAMUSCULAR | Status: DC | PRN
  Administered 2011-06-29: 120 ug via INTRAVENOUS
  Administered 2011-06-29 (×3): 80 ug via INTRAVENOUS
  Administered 2011-06-29: 120 ug via INTRAVENOUS

## 2011-06-29 MED ORDER — SUCCINYLCHOLINE CHLORIDE 20 MG/ML IJ SOLN
INTRAMUSCULAR | Status: DC | PRN
  Administered 2011-06-29: 80 mg via INTRAVENOUS

## 2011-06-29 MED ORDER — HYDROMORPHONE HCL PF 1 MG/ML IJ SOLN
1.0000 mg | Freq: Once | INTRAMUSCULAR | Status: AC
  Administered 2011-06-29: 1 mg via INTRAVENOUS
  Filled 2011-06-29: qty 1

## 2011-06-29 MED ORDER — CEFAZOLIN SODIUM 1-5 GM-% IV SOLN
1.0000 g | Freq: Three times a day (TID) | INTRAVENOUS | Status: DC
  Administered 2011-06-30 – 2011-07-01 (×7): 1 g via INTRAVENOUS
  Filled 2011-06-29 (×12): qty 50

## 2011-06-29 MED ORDER — ONDANSETRON HCL 4 MG/2ML IJ SOLN
4.0000 mg | Freq: Four times a day (QID) | INTRAMUSCULAR | Status: DC | PRN
  Filled 2011-06-29 (×5): qty 2

## 2011-06-29 MED ORDER — ACETAMINOPHEN 10 MG/ML IV SOLN
INTRAVENOUS | Status: AC
  Filled 2011-06-29: qty 100

## 2011-06-29 MED ORDER — INSULIN ASPART 100 UNIT/ML ~~LOC~~ SOLN
0.0000 [IU] | Freq: Three times a day (TID) | SUBCUTANEOUS | Status: DC
  Administered 2011-06-30 – 2011-07-01 (×5): 2 [IU] via SUBCUTANEOUS
  Administered 2011-07-02: 3 [IU] via SUBCUTANEOUS

## 2011-06-29 MED ORDER — SODIUM CHLORIDE 0.9 % IJ SOLN: 9.0000 mL | INTRAMUSCULAR | Status: DC | PRN

## 2011-06-29 MED ORDER — SODIUM CHLORIDE 0.9 % IV SOLN
INTRAVENOUS | Status: AC | PRN
  Administered 2011-06-29: 125 mL/h via INTRAVENOUS

## 2011-06-29 MED ORDER — INSULIN ASPART 100 UNIT/ML ~~LOC~~ SOLN
4.0000 [IU] | Freq: Three times a day (TID) | SUBCUTANEOUS | Status: DC
  Administered 2011-06-30 – 2011-07-02 (×5): 4 [IU] via SUBCUTANEOUS

## 2011-06-29 MED ORDER — MORPHINE SULFATE 4 MG/ML IJ SOLN
4.0000 mg | INTRAMUSCULAR | Status: DC | PRN
  Administered 2011-06-29: 4 mg via INTRAVENOUS
  Filled 2011-06-29: qty 1

## 2011-06-29 MED ORDER — ONDANSETRON HCL 4 MG/2ML IJ SOLN
4.0000 mg | Freq: Once | INTRAMUSCULAR | Status: AC
  Administered 2011-06-29: 4 mg via INTRAVENOUS
  Filled 2011-06-29: qty 2

## 2011-06-29 MED ORDER — HYDROMORPHONE 0.3 MG/ML IV SOLN
INTRAVENOUS | Status: DC
  Administered 2011-06-29: 21:00:00 via INTRAVENOUS
  Administered 2011-06-30: 1.59 mg via INTRAVENOUS
  Administered 2011-06-30: 0.999 mg via INTRAVENOUS
  Administered 2011-06-30: 0.2 mg via INTRAVENOUS
  Administered 2011-07-01: 1.39 mg via INTRAVENOUS
  Administered 2011-07-01: 1.99 mg via INTRAVENOUS

## 2011-06-29 MED ORDER — SODIUM CHLORIDE 0.9 % IV BOLUS (SEPSIS)
1000.0000 mL | Freq: Once | INTRAVENOUS | Status: AC
  Administered 2011-06-29: 1000 mL via INTRAVENOUS

## 2011-06-29 MED ORDER — NALOXONE HCL 0.4 MG/ML IJ SOLN: 0.4000 mg | INTRAMUSCULAR | Status: DC | PRN

## 2011-06-29 MED ORDER — ONDANSETRON HCL 4 MG/2ML IJ SOLN
INTRAMUSCULAR | Status: DC | PRN
  Administered 2011-06-29: 4 mg via INTRAVENOUS

## 2011-06-29 MED ORDER — AMLODIPINE BESYLATE 5 MG PO TABS
5.0000 mg | ORAL_TABLET | Freq: Every day | ORAL | Status: DC
  Administered 2011-06-30: 5 mg via ORAL
  Filled 2011-06-29: qty 1

## 2011-06-29 MED ORDER — ONDANSETRON 8 MG/NS 50 ML IVPB
8.0000 mg | Freq: Once | INTRAVENOUS | Status: AC
  Administered 2011-06-29: 8 mg via INTRAVENOUS
  Filled 2011-06-29: qty 8

## 2011-06-29 MED ORDER — FENTANYL CITRATE 0.05 MG/ML IJ SOLN
100.0000 ug | Freq: Once | INTRAMUSCULAR | Status: AC
  Administered 2011-06-29: 100 ug via INTRAVENOUS
  Filled 2011-06-29: qty 2

## 2011-06-29 MED ORDER — CEFAZOLIN SODIUM-DEXTROSE 2-3 GM-% IV SOLR
2.0000 g | INTRAVENOUS | Status: AC
  Administered 2011-06-29: 2 g via INTRAVENOUS
  Filled 2011-06-29: qty 50

## 2011-06-29 MED ORDER — KETAMINE HCL 10 MG/ML IJ SOLN
200.0000 mg | Freq: Once | INTRAMUSCULAR | Status: AC
  Administered 2011-06-29: 30 mg via INTRAVENOUS
  Filled 2011-06-29: qty 20

## 2011-06-29 MED ORDER — ONDANSETRON HCL 4 MG/2ML IJ SOLN
INTRAMUSCULAR | Status: AC
  Administered 2011-06-29: 4 mg
  Filled 2011-06-29: qty 4

## 2011-06-29 MED ORDER — KETAMINE HCL 10 MG/ML IJ SOLN
INTRAMUSCULAR | Status: AC | PRN
  Administered 2011-06-29: 30 mg via INTRAVENOUS

## 2011-06-29 SURGICAL SUPPLY — 40 items
BAG ZIPLOCK 12X15 (MISCELLANEOUS) ×2 IMPLANT
BANDAGE ELASTIC 6 VELCRO ST LF (GAUZE/BANDAGES/DRESSINGS) ×2 IMPLANT
BIT DRILL 2.5X110 QC LCP DISP (BIT) ×2 IMPLANT
CLOTH BEACON ORANGE TIMEOUT ST (SAFETY) ×2 IMPLANT
COVER SURGICAL LIGHT HANDLE (MISCELLANEOUS) ×2 IMPLANT
CUFF TOURN SGL QUICK 34 (TOURNIQUET CUFF) ×1
CUFF TRNQT CYL 34X4X40X1 (TOURNIQUET CUFF) ×1 IMPLANT
DRAPE C-ARM 42X72 X-RAY (DRAPES) IMPLANT
DRAPE OEC MINIVIEW 54X84 (DRAPES) ×2 IMPLANT
DRSG ADAPTIC 3X8 NADH LF (GAUZE/BANDAGES/DRESSINGS) ×2 IMPLANT
DRSG PAD ABDOMINAL 8X10 ST (GAUZE/BANDAGES/DRESSINGS) ×2 IMPLANT
ELECT REM PT RETURN 9FT ADLT (ELECTROSURGICAL) ×2
ELECTRODE REM PT RTRN 9FT ADLT (ELECTROSURGICAL) ×1 IMPLANT
GLOVE SURG ORTHO 9.0 STRL STRW (GLOVE) ×2 IMPLANT
KIT 1/3 TUB PL 6H 73M (Orthopedic Implant) ×1 IMPLANT
MANIFOLD NEPTUNE II (INSTRUMENTS) ×2 IMPLANT
NEEDLE HYPO 22GX1.5 SAFETY (NEEDLE) IMPLANT
NEEDLE MA TROC 1/2 (NEEDLE) ×2 IMPLANT
PACK LOWER EXTREMITY WL (CUSTOM PROCEDURE TRAY) ×2 IMPLANT
PAD CAST 4YDX4 CTTN HI CHSV (CAST SUPPLIES) ×2 IMPLANT
PADDING CAST COTTON 4X4 STRL (CAST SUPPLIES) ×2
PADDING CAST COTTON 6X4 STRL (CAST SUPPLIES) ×2 IMPLANT
POSITIONER SURGICAL ARM (MISCELLANEOUS) ×2 IMPLANT
PROS 1/3 TUB PL 6H 73M (Orthopedic Implant) ×2 IMPLANT
SCOTCHCAST PLUS 4X4 WHITE (CAST SUPPLIES) ×2 IMPLANT
SCOTCHCAST PLUS 5X4 WHITE (CAST SUPPLIES) ×2 IMPLANT
SCREW CORTEX 3.5 12MM (Screw) ×2 IMPLANT
SCREW CORTEX 3.5 16MM (Screw) ×1 IMPLANT
SCREW CORTEX 3.5 20MM (Screw) ×2 IMPLANT
SCREW LOCK CORT ST 3.5X12 (Screw) ×2 IMPLANT
SCREW LOCK CORT ST 3.5X16 (Screw) ×1 IMPLANT
SCREW LOCK CORT ST 3.5X20 (Screw) ×2 IMPLANT
SPONGE GAUZE 4X4 12PLY (GAUZE/BANDAGES/DRESSINGS) ×2 IMPLANT
STAPLER VISISTAT 35W (STAPLE) ×4 IMPLANT
SUT ETHILON 4 0 PS 2 18 (SUTURE) IMPLANT
SUT FIBERWIRE #2 38 T-5 BLUE (SUTURE) ×4
SUT VIC AB 2-0 CT1 27 (SUTURE) ×2
SUT VIC AB 2-0 CT1 TAPERPNT 27 (SUTURE) ×2 IMPLANT
SUTURE FIBERWR #2 38 T-5 BLUE (SUTURE) ×2 IMPLANT
SYR CONTROL 10ML LL (SYRINGE) IMPLANT

## 2011-06-29 SURGICAL SUPPLY — 25 items
BAG ZIPLOCK 12X15 (MISCELLANEOUS) IMPLANT
CLOTH BEACON ORANGE TIMEOUT ST (SAFETY) IMPLANT
COVER SURGICAL LIGHT HANDLE (MISCELLANEOUS) IMPLANT
CUFF TOURN SGL QUICK 34 (TOURNIQUET CUFF)
CUFF TRNQT CYL 34X4X40X1 (TOURNIQUET CUFF) IMPLANT
DRAPE C-ARM 42X72 X-RAY (DRAPES) IMPLANT
DRSG ADAPTIC 3X8 NADH LF (GAUZE/BANDAGES/DRESSINGS) IMPLANT
DRSG PAD ABDOMINAL 8X10 ST (GAUZE/BANDAGES/DRESSINGS) IMPLANT
ELECT REM PT RETURN 9FT ADLT (ELECTROSURGICAL)
ELECTRODE REM PT RTRN 9FT ADLT (ELECTROSURGICAL) IMPLANT
GLOVE SURG ORTHO 9.0 STRL STRW (GLOVE) IMPLANT
MANIFOLD NEPTUNE II (INSTRUMENTS) IMPLANT
NEEDLE HYPO 22GX1.5 SAFETY (NEEDLE) IMPLANT
PACK LOWER EXTREMITY WL (CUSTOM PROCEDURE TRAY) IMPLANT
PAD CAST 4YDX4 CTTN HI CHSV (CAST SUPPLIES) IMPLANT
PADDING CAST COTTON 4X4 STRL (CAST SUPPLIES)
POSITIONER SURGICAL ARM (MISCELLANEOUS) IMPLANT
SPONGE GAUZE 4X4 12PLY (GAUZE/BANDAGES/DRESSINGS) IMPLANT
STAPLER VISISTAT 35W (STAPLE) IMPLANT
SUT ETHILON 4 0 PS 2 18 (SUTURE) IMPLANT
SUT FIBERWIRE #2 38 T-5 BLUE (SUTURE)
SUT VIC AB 2-0 CT1 27 (SUTURE)
SUT VIC AB 2-0 CT1 TAPERPNT 27 (SUTURE) IMPLANT
SUTURE FIBERWR #2 38 T-5 BLUE (SUTURE) IMPLANT
SYR CONTROL 10ML LL (SYRINGE) IMPLANT

## 2011-06-29 NOTE — ED Notes (Signed)
Pt belongings in [place] [address]. Clothes, Cell Phone, and Glasses.

## 2011-06-29 NOTE — ED Provider Notes (Signed)
History    56 year old female with right ankle pain. Patient was walking in wet grass when she slipped and fell. Pain and deformity to her right ankle. Denies any significant pain anywhere as. Denies hitting her head. No loss of consciousness. No HA, neck or back pain. Denies numbness or tingling. CSN: [id number]  Arrival date & time 06/29/11  7829   First MD Initiated Contact with Patient 06/29/11 [phone]      Chief Complaint  Patient presents with  . Ankle Pain    (Consider location/radiation/quality/duration/timing/severity/associated sxs/prior treatment) HPI  Past Medical History  Diagnosis Date  . Diabetes mellitus   . Hypertension     History reviewed. No pertinent past surgical history.  No family history on file.  History  Substance Use Topics  . Smoking status: Not on file  . Smokeless tobacco: Not on file  . Alcohol Use: No    OB History    Grav Para Term Preterm Abortions TAB SAB Ect Mult Living                  Review of Systems   Review of symptoms negative unless otherwise noted in HPI.   Allergies  Review of patient's allergies indicates no known allergies.  Home Medications  No current outpatient prescriptions on file.  BP 101/56  Pulse 85  Temp(Src) 98.2 F (36.8 C) (Oral)  Resp 18  SpO2 99%  Physical Exam  Nursing note and vitals reviewed. Constitutional: No distress.       Laying in bed. Uncomfortable appearing. Obese.  HENT:  Head: Normocephalic and atraumatic.  Eyes: Conjunctivae are normal. Right eye exhibits no discharge. Left eye exhibits no discharge.  Neck: Neck supple.  Cardiovascular: Normal rate, regular rhythm and normal heart sounds.  Exam reveals no gallop and no friction rub.   No murmur heard. Pulmonary/Chest: Effort normal and breath sounds normal. No respiratory distress.  Abdominal: Soft. She exhibits no distension. There is no tenderness.  Musculoskeletal: She exhibits no edema and no tenderness.       Deformity  to the right ankle. Diffuse swelling. The foot is rotated laterally. Skin is intact. Palpable DP pulse. Sensation intact to light touch.There is no tenderness of the proximal tib/fibula with palpation and compression.  Neurological: She is alert.  Skin: Skin is warm and dry. She is not diaphoretic.  Psychiatric: She has a normal mood and affect. Her behavior is normal. Thought content normal.    ED Course  ORTHOPEDIC INJURY TREATMENT Date/Time: 06/29/2011 9:32 AM Performed by: Raeford Razor Authorized by: Raeford Razor Consent: Verbal consent obtained. Written consent obtained. Risks and benefits: risks, benefits and alternatives were discussed Consent given by: patient Patient understanding: patient states understanding of the procedure being performed Patient consent: the patient's understanding of the procedure matches consent given Imaging studies: imaging studies available Required items: required blood products, implants, devices, and special equipment available Patient identity confirmed: verbally with patient and arm band Time out: Immediately prior to procedure a "time out" was called to verify the correct patient, procedure, equipment, support staff and site/side marked as required. Injury location: ankle Location details: right ankle Injury type: fracture-dislocation Fracture type: lateral malleolus Pre-procedure neurovascular assessment: neurovascularly intact Pre-procedure distal perfusion: normal Pre-procedure neurological function: normal Pre-procedure range of motion: reduced Local anesthesia used: no Patient sedated: yes Sedation type: moderate (conscious) sedation Sedatives: ketamine and propofol Analgesia: fentanyl Vitals: Vital signs were monitored during sedation. Manipulation performed: yes Skeletal traction used: yes Reduction successful: yes X-ray confirmed  reduction: yes Immobilization: splint Splint type: ankle stirrup Post-procedure neurovascular  assessment: post-procedure neurovascularly intact Patient tolerance: Patient tolerated the procedure well with no immediate complications. Comments: Stirrup and posterior splint  SPLINT APPLICATION Date/Time: 06/29/2011 9:39 AM Performed by: Raeford Razor Authorized by: Raeford Razor Consent: Verbal consent obtained. Written consent obtained. Risks and benefits: risks, benefits and alternatives were discussed Consent given by: patient Required items: required blood products, implants, devices, and special equipment available Patient identity confirmed: verbally with patient and arm band Time out: Immediately prior to procedure a "time out" was called to verify the correct patient, procedure, equipment, support staff and site/side marked as required. Location details: right ankle Splint type: ankle stirrup Post-procedure: The splinted body part was neurovascularly unchanged following the procedure. Patient tolerance: Patient tolerated the procedure well with no immediate complications. Comments: Stirrup and short leg splint  Procedural sedation Date/Time: 06/29/2011 9:41 AM Performed by: Raeford Razor Authorized by: Raeford Razor Consent: Verbal consent obtained. Written consent obtained. Risks and benefits: risks, benefits and alternatives were discussed Consent given by: patient Patient understanding: patient states understanding of the procedure being performed Patient consent: the patient's understanding of the procedure matches consent given Imaging studies: imaging studies available Required items: required blood products, implants, devices, and special equipment available Patient identity confirmed: verbally with patient and arm band Time out: Immediately prior to procedure a "time out" was called to verify the correct patient, procedure, equipment, support staff and site/side marked as required. Local anesthesia used: no Patient sedated: yes Sedation type: moderate  (conscious) sedation Sedatives: ketamine, propofol and see MAR for details Analgesia: fentanyl Vitals: Vital signs were monitored during sedation. Patient tolerance: Patient tolerated the procedure well with no immediate complications.   (including critical care time)  Labs Reviewed - No data to display Dg Ankle Complete Right  06/29/2011  *RADIOLOGY REPORT*  Clinical Data: Fall.  Ankle deformity.  RIGHT ANKLE - COMPLETE 3+ VIEW  Comparison: None.  Findings: Unstable Weber B (Lauge Hansen SER stage 4) fracture dislocation of the right ankle noted, with considerable lateral displacement of the talus with respect to the dominant tibial and fibular shaft fragments, and with some outward rotation of the talus and foot with respect to the plafond.  Medial malleolar fracture, if present, is subtle and accordingly the deltoid ligament is likely torn.  There is a suggestion of pes planus on the lateral projection. Plantar calcaneal spur and midfoot spurring noted.  There is poor definition of the subtalar joints, probably from rotation, although a fracture along the subtalar joints cannot be readily excluded.  There is also a mildly widened appearance of the talonavicular articulation.  IMPRESSION:  1.  Supination - external rotation fracture-dislocation of the right ankle as described above. 2.  Given the degree of angulation on the lateral projection, it is difficult to exclude fracture along the subtalar joints.  This could be reassessed on post reduction views, or with CT. 3.  Suspected mild widening of the talonavicular joint. 4.  Midfoot spurring.  Original Report Authenticated By: Dellia Cloud, M.D.     1. Fracture dislocation of ankle       MDM  56 year old female with a fracture or dislocation R ankle. Closed injury. Reduced in ED. Discussed with ortho. OR this evening.       Raeford Razor, MD 06/29/11 [phone]

## 2011-06-29 NOTE — ED Notes (Signed)
Patient transported to X-ray 

## 2011-06-29 NOTE — Progress Notes (Signed)
Pt confirmed pcp as Dr Janace Litten at [facility]

## 2011-06-29 NOTE — ED Notes (Signed)
Patient returned from X-ray 

## 2011-06-29 NOTE — Transfer of Care (Addendum)
Immediate Anesthesia Transfer of Care Note  Patient: Alexandria Ruiz  Procedure(s) Performed: Procedure(s) (LRB): OPEN REDUCTION INTERNAL FIXATION (ORIF) ANKLE FRACTURE (Right)  Patient Location: PACU  Anesthesia Type: General  Level of Consciousness: awake, alert , patient cooperative and responds to stimulation  Airway & Oxygen Therapy: Patient Spontanous Breathing and non-rebreather face mask  Post-op Assessment: Report given to PACU RN and Post -op Vital signs reviewed and stable  Post vital signs: stable  Complications: No apparent anesthesia complications Pt desaturating with lower O2 sat; therefor using a nonrebreather mask, airways oral amnd nasal in place

## 2011-06-29 NOTE — Addendum Note (Signed)
Addendum  created 06/29/11 2142 by Gaetano Hawthorne, MD   Modules edited:Orders

## 2011-06-29 NOTE — Anesthesia Postprocedure Evaluation (Signed)
  Anesthesia Post-op Note  Patient: Alexandria Ruiz  Procedure(s) Performed: Procedure(s) (LRB): OPEN REDUCTION INTERNAL FIXATION (ORIF) ANKLE FRACTURE (Right)  Patient Location: PACU  Anesthesia Type: General  Level of Consciousness: awake and alert   Airway and Oxygen Therapy: Patient Spontanous Breathing  Post-op Pain: mild  Post-op Assessment: Post-op Vital signs reviewed, Patient's Cardiovascular Status Stable, Respiratory Function Stable, Patent Airway and No signs of Nausea or vomiting  Post-op Vital Signs: stable  Complications: No apparent anesthesia complications

## 2011-06-29 NOTE — ED Notes (Signed)
Per EMS pt states fell in wet grass, rt ankle deformity noted, enroute pt given fentanyl , pt a/o, denies loc

## 2011-06-29 NOTE — Consult Note (Signed)
PCP:  Bradd Burner, PA, PA   Reconciled by staff previously   Requesting consultation: Dr. Montez Morita Reason for consultation: Medical comorbidities  HPI: 55yoF with h/o HTN, DM presented with mechanical fall and right ankle  fracture, now admitted to surgical service s/p ORIF. [facility] is  consulted for assistance with her medical comorbidities, specifically  HTN and DM.   Pt was in usual state of health without complaints until today when  she slipped and had a mechanical fall, sustaining a right ankle  fracture. She had no promontory symptoms and no LOC. She was brought  to the ED and admitted to surgery, and is now s/p right ORIF.   Vitals thus far have been stable, except some HTN up to 178/94, but  most recently 132/74 and mostly in the 120-140 / 70-80 range. Labs  have shown normal chemistry panel including renal fxn 15/0.64.  Glucoses have ranged 93-157. CBC shows normal WBC count, Hgb 10.4  with unclear baseline, and thrombocytosis to 465. CXR shows  borderline cardiomegaly otherwise unremarkable. Ankle plain films  showed the fracture.   Husband states that she has no major comorbidities other than  Metformin-controlled DM, Norvasc-controlled HTN, and sleep apnea on  CPAP. She also takes a statin and some other PRN meds she cannot name  at present. They state that both the DM and HTN are well controlled  and not major issues on the outpt side.   ROS as above otherwise negative. The ankle fracture is the only  apparent active issue.   Past Medical History  Diagnosis Date  . Diabetes mellitus   . Hypertension   . PONV (postoperative nausea and vomiting)     Past Surgical History  Procedure Date  . Abdominal hysterectomy   . Breast reduction surgery   . Hammer toe surgery     bilateral feet  . Nose surgery     sinus surgery    Medications:  HOME MEDS: Not reconciled, but by my discussion she was taking norvasc, a statin, and metformin, and "some PRN  meds."  Prior to Admission medications   Not on File    Allergies:  No Known Allergies  Social History:   reports that she quit smoking about 4 years ago. She has never used smokeless tobacco. She reports that she does not drink alcohol or use illicit drugs.  She smoked for about 20 years, up to 1ppd at most. Lives at home with her husband and is still active, ambulatory    Family History: History reviewed. No pertinent family history.  Physical Exam: Filed Vitals:   06/29/11 2115 06/29/11 2130 06/29/11 2201 06/29/11 2303  BP: 146/78 143/73 132/74 147/84  Pulse: 82 73 83 80  Temp:  97.6 F (36.4 C) 97.6 F (36.4 C) 97.7 F (36.5 C)  TempSrc:    Oral  Resp: 19 21  17   SpO2: 98% 99% 96% 98%   Blood pressure 147/84, pulse 80, temperature 97.7 F (36.5 C), temperature source Oral, resp. rate 17, SpO2 98.00%. Gen: Large, sleepy F in no distress, awoken easily to voice and able  to answer questions appropriately, but states she's having hard time  keeping her eyes open. Appears well, breathing comfortably.  HEENT: Pupils round and reactive, EOMI, sclera are injected and  glossy. Mouth is moist appearing and overall normal Lungs: CTAB grossly without w/c/r, fair air movement Heart: Regular, S1/2 appreciated, no m/g heard, overall normal exam Abd: Very obese but soft, non tender, non distended, no facial  grimacing to palpation Extrem: Warm, perfusing normally, radials palpable, no BLE edema on  LLE noted, RLE is in dense cast.  Neuro: Sleepy but attentive minimally when awoken, is appropriate  post-op mental status. Full exam deferred. History gathered mostly  from husband.   Labs & Imaging Results for orders placed during the hospital encounter of 06/29/11 (from the past 48 hour(s))  CBC     Status: Abnormal   Collection Time   06/29/11 10:33 AM      Component Value Range Comment   WBC 9.6  4.0 - 10.5 (K/uL)    RBC 4.00  3.87 - 5.11 (MIL/uL)    Hemoglobin 10.4 (*) 12.0 -  15.0 (g/dL)    HCT 16.1 (*) 09.6 - 46.0 (%)    MCV 84.5  78.0 - 100.0 (fL)    MCH 26.0  26.0 - 34.0 (pg)    MCHC 30.8  30.0 - 36.0 (g/dL)    RDW 04.5 (*) 40.9 - 15.5 (%)    Platelets 465 (*) 150 - 400 (K/uL)   BASIC METABOLIC PANEL     Status: Normal   Collection Time   06/29/11 10:33 AM      Component Value Range Comment   Sodium 139  135 - 145 (mEq/L)    Potassium 3.5  3.5 - 5.1 (mEq/L)    Chloride 100  96 - 112 (mEq/L)    CO2 25  19 - 32 (mEq/L)    Glucose, Bld 93  70 - 99 (mg/dL)    BUN 15  6 - 23 (mg/dL)    Creatinine, Ser 8.11  0.50 - 1.10 (mg/dL)    Calcium 91.4  8.4 - 10.5 (mg/dL)    GFR calc non Af Amer >90  >90 (mL/min)    GFR calc Af Amer >90  >90 (mL/min)   GLUCOSE, CAPILLARY     Status: Abnormal   Collection Time   06/29/11  4:56 PM      Component Value Range Comment   Glucose-Capillary 127 (*) 70 - 99 (mg/dL)   GLUCOSE, CAPILLARY     Status: Abnormal   Collection Time   06/29/11  8:30 PM      Component Value Range Comment   Glucose-Capillary 157 (*) 70 - 99 (mg/dL)   GLUCOSE, CAPILLARY     Status: Abnormal   Collection Time   06/29/11 10:57 PM      Component Value Range Comment   Glucose-Capillary 154 (*) 70 - 99 (mg/dL)    Dg Chest 2 View  7/82/9562  *RADIOLOGY REPORT*  Clinical Data: Preop right ankle fracture.  Prior smoker.  CHEST - 2 VIEW  Comparison: 08/20/2009  Findings: Heart is borderline enlarged.  No confluent airspace opacities or effusions.  Degenerative changes in the shoulders.  No acute bony abnormality.  IMPRESSION: Borderline cardiomegaly.  No acute findings.  Original Report Authenticated By: Cyndie Chime, M.D.   Dg Ankle Complete Right  06/29/2011  *RADIOLOGY REPORT*  Clinical Data: Postreduction.  RIGHT ANKLE - COMPLETE 3+ VIEW  Comparison: 06/29/2011  Findings: Interval reduction of the right ankle fracture dislocation.  Continued widening of the ankle mortise medially. Mild continued displacement of the distal fibular fracture.  IMPRESSION:  Interval reduction of the fracture dislocation as above.  Original Report Authenticated By: Cyndie Chime, M.D.   Dg Ankle Complete Right  06/29/2011  *RADIOLOGY REPORT*  Clinical Data: Fall.  Ankle deformity.  RIGHT ANKLE - COMPLETE 3+ VIEW  Comparison: None.  Findings: Unstable Weber  B (Lauge Hansen SER stage 4) fracture dislocation of the right ankle noted, with considerable lateral displacement of the talus with respect to the dominant tibial and fibular shaft fragments, and with some outward rotation of the talus and foot with respect to the plafond.  Medial malleolar fracture, if present, is subtle and accordingly the deltoid ligament is likely torn.  There is a suggestion of pes planus on the lateral projection. Plantar calcaneal spur and midfoot spurring noted.  There is poor definition of the subtalar joints, probably from rotation, although a fracture along the subtalar joints cannot be readily excluded.  There is also a mildly widened appearance of the talonavicular articulation.  IMPRESSION:  1.  Supination - external rotation fracture-dislocation of the right ankle as described above. 2.  Given the degree of angulation on the lateral projection, it is difficult to exclude fracture along the subtalar joints.  This could be reassessed on post reduction views, or with CT. 3.  Suspected mild widening of the talonavicular joint. 4.  Midfoot spurring.  Original Report Authenticated By: Dellia Cloud, M.D.    Impression 55yoF with h/o HTN, DM presented with mechanical fall and right ankle  fracture, now admitted to surgical service s/p ORIF. [facility] is  consulted for assistance with her medical comorbidities, specifically  HTN and DM.   Per EPIC review, both are actually pretty well controlled at present.  Her BP is running just a bit on the higher side, but not to the point  that I would aggressively chase this down. Although her meds have not  been officially reconciled (I have requested  this be done by pharmacy  in [facility]), they state she takes norvasc, so I have added this back at  the lower dose of 5mg /daily. After meds are reconciled, may need to  increase to 10 if that was her home dose.   Blood sugars are well controlled at present as well and she is  ordered for a sliding scale with CBG's TID/AC. Would just continue to  monitor this.   She is also already ordered for her home CPAP, which is fine. She is  a bit on the anemic side for her age at a Hgb of 10.4, but as I  discussed with her husband, this is not unstable and I would not  chase this down on the inpatient side.   Therefore, overall other than the ankle fracture I don't think there  is much more medically to be done for her at this point. I will have  a daytime colleague check on her tomorrow. Feel free to contact us  with any questions regarding her medical care, and thank you for  allowing [place] to participate in the care of this very nice lady.    Jeanny Rymer 06/29/2011, 11:10 PM

## 2011-06-29 NOTE — Brief Op Note (Signed)
06/29/2011  7:43 PM  PATIENT:  Alexandria Ruiz  56 y.o. female  PRE-OPERATIVE DIAGNOSIS:  FRACTURE DISLOCATION RIGHT ANKLE  POST-OPERATIVE DIAGNOSIS:  fracture dislocation right ankle  PROCEDURE:  Procedure(s) (LRB): OPEN REDUCTION INTERNAL FIXATION (ORIF) ANKLE FRACTURE (Right)  SURGEON:  Surgeon(s) and Role:    * Kennieth Rad, MD - Primary  PHYSICIAN ASSISTANT:   ASSISTANTS: none   ANESTHESIA:   general  EBL:     BLOOD ADMINISTERED:none  DRAINS: none   LOCAL MEDICATIONS USED:  NONE  SPECIMEN:  No Specimen  DISPOSITION OF SPECIMEN:  N/A  COUNTS:  YES  TOURNIQUET:   Total Tourniquet Time Documented: Thigh (Right) - 74 minutes  DICTATION: .Other Dictation: Dictation Number report #[id number]  PLAN OF CARE: Admit to inpatient   PATIENT DISPOSITION:  PACU - hemodynamically stable.   Delay start of Pharmacological VTE agent (>24hrs) due to surgical blood loss or risk of bleeding: not applicable

## 2011-06-29 NOTE — H&P (Signed)
Alexandria Ruiz is an 56 y.o. female.   Chief Complaint: PAINFUL DEFORMED RIGHT  ANKLE HPI: THIS IS A 55Y/O FEMALE WHO LOSS HER FOOTING ON A WET SURFACE OUTSIDE OF HER HOME AND INJURED HER RIGHT ANKLE. NO HISTORY OF LOC, OR ANY OTHER INJURIES.  Past Medical History  Diagnosis Date  . Diabetes mellitus   . Hypertension     History reviewed. No pertinent past surgical history.  No family history on file. Social History:  does not have a smoking history on file. She does not have any smokeless tobacco history on file. She reports that she does not drink alcohol or use illicit drugs.  Allergies: No Known Allergies  Medications Prior to Admission  Medication Dose Route Frequency Provider Last Rate Last Dose  . 0.9 %  sodium chloride infusion   Intravenous Continuous PRN Alexandria Razor, MD   125 mL/hr at 06/29/11 [phone]  . 0.9 %  sodium chloride infusion   Intravenous Continuous Alexandria Razor, MD      . fentaNYL (SUBLIMAZE) injection 100 mcg  100 mcg Intravenous Once Alexandria Razor, MD   100 mcg at 06/29/11 0850  . HYDROmorphone (DILAUDID) injection 1 mg  1 mg Intravenous Once Alexandria Razor, MD   1 mg at 06/29/11 1023  . ketamine (KETALAR) injection 200 mg  200 mg Intravenous Once Alexandria Razor, MD   30 mg at 06/29/11 9604  . ketamine (KETALAR) injection   Intravenous PRN Alexandria Razor, MD   30 mg at 06/29/11 0921  . ondansetron (ZOFRAN) injection 4 mg  4 mg Intravenous Once Alexandria Razor, MD   4 mg at 06/29/11 1144  . propofol (DIPRIVAN) 10 MG/ML infusion 200 mg  20 mL Intravenous Once Alexandria Razor, MD   30 mg at 06/29/11 5409  . propofol (DIPRIVAN) 10 MG/ML infusion   Intravenous Continuous PRN Alexandria Razor, MD   30 mg/hr at 06/29/11 0921  . sodium chloride 0.9 % bolus 1,000 mL  1,000 mL Intravenous Once Alexandria Razor, MD   1,000 mL at 06/29/11 0920   No current outpatient prescriptions on file as of 06/29/2011.    Results for orders placed during the hospital encounter of 06/29/11 (from  the past 48 hour(s))  CBC     Status: Abnormal   Collection Time   06/29/11 10:33 AM      Component Value Range Comment   WBC 9.6  4.0 - 10.5 (K/uL)    RBC 4.00  3.87 - 5.11 (MIL/uL)    Hemoglobin 10.4 (*) 12.0 - 15.0 (g/dL)    HCT 81.1 (*) 91.4 - 46.0 (%)    MCV 84.5  78.0 - 100.0 (fL)    MCH 26.0  26.0 - 34.0 (pg)    MCHC 30.8  30.0 - 36.0 (g/dL)    RDW 78.2 (*) 95.6 - 15.5 (%)    Platelets 465 (*) 150 - 400 (K/uL)   BASIC METABOLIC PANEL     Status: Normal   Collection Time   06/29/11 10:33 AM      Component Value Range Comment   Sodium 139  135 - 145 (mEq/L)    Potassium 3.5  3.5 - 5.1 (mEq/L)    Chloride 100  96 - 112 (mEq/L)    CO2 25  19 - 32 (mEq/L)    Glucose, Bld 93  70 - 99 (mg/dL)    BUN 15  6 - 23 (mg/dL)    Creatinine, Ser 2.13  0.50 - 1.10 (mg/dL)  Calcium 10.1  8.4 - 10.5 (mg/dL)    GFR calc non Af Amer >90  >90 (mL/min)    GFR calc Af Amer >90  >90 (mL/min)    Dg Chest 2 View  06/29/2011  *RADIOLOGY REPORT*  Clinical Data: Preop right ankle fracture.  Prior smoker.  CHEST - 2 VIEW  Comparison: 08/20/2009  Findings: Heart is borderline enlarged.  No confluent airspace opacities or effusions.  Degenerative changes in the shoulders.  No acute bony abnormality.  IMPRESSION: Borderline cardiomegaly.  No acute findings.  Original Report Authenticated By: Cyndie Chime, M.D.   Dg Ankle Complete Right  06/29/2011  *RADIOLOGY REPORT*  Clinical Data: Postreduction.  RIGHT ANKLE - COMPLETE 3+ VIEW  Comparison: 06/29/2011  Findings: Interval reduction of the right ankle fracture dislocation.  Continued widening of the ankle mortise medially. Mild continued displacement of the distal fibular fracture.  IMPRESSION: Interval reduction of the fracture dislocation as above.  Original Report Authenticated By: Cyndie Chime, M.D.   Dg Ankle Complete Right  06/29/2011  *RADIOLOGY REPORT*  Clinical Data: Fall.  Ankle deformity.  RIGHT ANKLE - COMPLETE 3+ VIEW  Comparison: None.   Findings: Unstable Weber B (Lauge Hansen SER stage 4) fracture dislocation of the right ankle noted, with considerable lateral displacement of the talus with respect to the dominant tibial and fibular shaft fragments, and with some outward rotation of the talus and foot with respect to the plafond.  Medial malleolar fracture, if present, is subtle and accordingly the deltoid ligament is likely torn.  There is a suggestion of pes planus on the lateral projection. Plantar calcaneal spur and midfoot spurring noted.  There is poor definition of the subtalar joints, probably from rotation, although a fracture along the subtalar joints cannot be readily excluded.  There is also a mildly widened appearance of the talonavicular articulation.  IMPRESSION:  1.  Supination - external rotation fracture-dislocation of the right ankle as described above. 2.  Given the degree of angulation on the lateral projection, it is difficult to exclude fracture along the subtalar joints.  This could be reassessed on post reduction views, or with CT. 3.  Suspected mild widening of the talonavicular joint. 4.  Midfoot spurring.  Original Report Authenticated By: Dellia Cloud, M.D.    ROS  Blood pressure 137/62, pulse 80, temperature 98.2 F (36.8 C), temperature source Oral, resp. rate 16, SpO2 97.00%. Physical Exam   Assessment/Plan FRACTURE  DISLOCATION RIGHT ANKLE/ PLAN ORIF RIGHT ANKLE  Alexandria Ruiz F 06/29/2011, 12:09 PM

## 2011-06-29 NOTE — Anesthesia Preprocedure Evaluation (Addendum)
Anesthesia Evaluation  Patient identified by MRN, date of birth, ID band Patient awake    Reviewed: Allergy & Precautions, H&P , NPO status , Patient's Chart, lab work & pertinent test results  History of Anesthesia Complications (+) PONV  Airway Mallampati: II TM Distance: >3 FB Neck ROM: full    Dental  (+) Caps and Dental Advisory Given,    Pulmonary neg pulmonary ROS, sleep apnea and Continuous Positive Airway Pressure Ventilation ,  breath sounds clear to auscultation  Pulmonary exam normal       Cardiovascular Exercise Tolerance: Good hypertension, Pt. on medications negative cardio ROS  Rhythm:regular Rate:Normal     Neuro/Psych negative neurological ROS  negative psych ROS   GI/Hepatic negative GI ROS, Neg liver ROS, GERD-  ,  Endo/Other  negative endocrine ROSDiabetes mellitus-, Well Controlled, Type 2, Oral Hypoglycemic Agents  Renal/GU negative Renal ROS  negative genitourinary   Musculoskeletal   Abdominal   Peds  Hematology negative hematology ROS (+)   Anesthesia Other Findings   Reproductive/Obstetrics negative OB ROS                        Anesthesia Physical Anesthesia Plan  ASA: III  Anesthesia Plan: General   Post-op Pain Management:    Induction: Intravenous  Airway Management Planned: LMA  Additional Equipment:   Intra-op Plan:   Post-operative Plan:   Informed Consent: I have reviewed the patients History and Physical, chart, labs and discussed the procedure including the risks, benefits and alternatives for the proposed anesthesia with the patient or authorized representative who has indicated his/her understanding and acceptance.   Dental Advisory Given  Plan Discussed with: CRNA and Surgeon  Anesthesia Plan Comments:        Anesthesia Quick Evaluation

## 2011-06-30 DIAGNOSIS — I10 Essential (primary) hypertension: Secondary | ICD-10-CM | POA: Diagnosis present

## 2011-06-30 DIAGNOSIS — S82891A Other fracture of right lower leg, initial encounter for closed fracture: Secondary | ICD-10-CM | POA: Diagnosis present

## 2011-06-30 LAB — GLUCOSE, CAPILLARY: Glucose-Capillary: 102 mg/dL — ABNORMAL HIGH (ref 70–99)

## 2011-06-30 MED ORDER — ACETAMINOPHEN 325 MG PO TABS
650.0000 mg | ORAL_TABLET | ORAL | Status: DC | PRN
  Administered 2011-06-30 (×3): 650 mg via ORAL
  Filled 2011-06-30 (×2): qty 2
  Filled 2011-06-30: qty 1

## 2011-06-30 MED ORDER — ATORVASTATIN CALCIUM 40 MG PO TABS
40.0000 mg | ORAL_TABLET | Freq: Every day | ORAL | Status: DC
  Administered 2011-07-01: 40 mg via ORAL
  Filled 2011-06-30 (×3): qty 1

## 2011-06-30 MED ORDER — MOMETASONE FURO-FORMOTEROL FUM 100-5 MCG/ACT IN AERO
2.0000 | INHALATION_SPRAY | Freq: Two times a day (BID) | RESPIRATORY_TRACT | Status: DC
  Administered 2011-06-30 – 2011-07-02 (×4): 2 via RESPIRATORY_TRACT
  Filled 2011-06-30 (×2): qty 13

## 2011-06-30 MED ORDER — ACETAMINOPHEN 325 MG PO TABS
ORAL_TABLET | ORAL | Status: AC
  Administered 2011-06-30: 650 mg via ORAL
  Filled 2011-06-30: qty 2

## 2011-06-30 MED ORDER — SERTRALINE HCL 50 MG PO TABS
50.0000 mg | ORAL_TABLET | Freq: Every day | ORAL | Status: DC
  Administered 2011-06-30 – 2011-07-01 (×2): 50 mg via ORAL
  Filled 2011-06-30 (×3): qty 1

## 2011-06-30 MED ORDER — ADULT MULTIVITAMIN W/MINERALS CH
1.0000 | ORAL_TABLET | Freq: Every day | ORAL | Status: DC
  Administered 2011-06-30 – 2011-07-01 (×2): 1 via ORAL
  Filled 2011-06-30 (×3): qty 1

## 2011-06-30 MED ORDER — AMLODIPINE BESYLATE 10 MG PO TABS
10.0000 mg | ORAL_TABLET | Freq: Every day | ORAL | Status: DC
  Administered 2011-06-30 – 2011-07-01 (×2): 10 mg via ORAL
  Filled 2011-06-30 (×3): qty 1

## 2011-06-30 NOTE — Addendum Note (Signed)
Addendum  created 06/30/11 1744 by Illene Silver, CRNA   Modules edited:Anesthesia Events, Anesthesia Flowsheet

## 2011-06-30 NOTE — Progress Notes (Signed)
Patient currently on the Alaris O2 canula. She usually wears a nasal CPAP mask nocturnally, but refuses tonight due to the specialized canula. She does agree if she has diffuculty, to notify the RN or RT for CPAP initiation. Husband at bedside. RN aware.

## 2011-06-30 NOTE — Progress Notes (Signed)
Pt states that she does not want to wear CPAP tonight.  Pt encouraged to notify RN/RT of any concerns.

## 2011-06-30 NOTE — Addendum Note (Signed)
Addendum  created 06/30/11 1744 by Jaslynne Dahan E Hiram Mciver, CRNA   Modules edited:Anesthesia Events, Anesthesia Flowsheet    

## 2011-06-30 NOTE — Progress Notes (Signed)
Pt found out of bed sitting on bsc attempting to void. Stated that she could not void well on the bedpan. Pt had been on the bedpan several times tonight and has voided. Explained to patient that she should not be out of bed without an order from the MD and that PT will be coming to evaluate her today. Pt stated that she understood but could not use that bedpan any longer. Her husband had assisted her up to the bsc. Pt also vomited while sitting there. Pt was medicated earlier with zofran. She feels like  receiving the Dilaudid on an empty stomach is making her nauseated, crackers given to her to try this time. Will cont to monitor her. Sidney Ace

## 2011-06-30 NOTE — H&P (Signed)
[name], Alexandria NO.:  [id number]  MEDICAL RECORD NO.:  [id number]  LOCATION:  1310                         FACILITY:  [facility]  PHYSICIAN:  Myrtie Neither, MD      DATE OF BIRTH:  [birth year]  DATE OF ADMISSION:  06/29/2011 DATE OF DISCHARGE:                             HISTORY & PHYSICAL   CHIEF COMPLAINT:  Painful deformed right ankle.  HISTORY OF PRESENT ILLNESS:  This is a 56 year old female who was stepping outside of her home, stepped on a slippery surface and lost her footing, turning her right ankle.  The patient was brought to [facility] emergency room for treatment.  The patient denied loss of consciousness or any other injury.  PAST MEDICAL HISTORY:  She has diabetes mellitus, hyperlipidemia, anemia, anxiety, hypertension, chronic sinusitis, GERD, cervical arthralgia, chronic low back pain.  ALLERGIES:  None known.  SOCIAL HISTORY:  The patient quit smoking 4 years ago.  She denies any use of alcohol or illegal drugs.  FAMILY HISTORY:  Hypertension and diabetes.  REVIEW OF SYSTEMS:  The patient also has a history of sleep apnea, some wheezing from asthma and shortness of breath.  No urinary or bowel symptoms.  MEDICATIONS:  Metformin, Voltaren 75 mg b.i.d., Norvasc for hypertension, Prevacid, CPAP at bedtime.  PHYSICAL EXAMINATION:  GENERAL:  Alert and oriented.  No acute distress. VITAL SIGNS:  Blood pressure 137/62, respirations 16, pulse 80, O2 saturation 97%, temperature 97. HEAD:  Normocephalic.  Eyes, conjunctivae are clear. NECK:  Supple. CHEST:  Clear. CARDIAC:  S1, S2 regular. EXTREMITIES:  Right ankle deformed, swollen.  Posterior splint. Dorsalis pedis is intact.  Patient is able to move the toes well.  LABORATORY DATA:  X-ray revealed fracture dislocation of the right ankle with fractured fibula and complete disruption deltoid and labrum.  IMPRESSION:  Fracture dislocation, right ankle.  History of diabetes mellitus,  hyperlipidemia, chronic anemia, anxiety, hypertension, GERD, lower back pain, sleep apnea, asthma.  Preop laboratories stable.  PLAN:  ORIF of the right ankle.     Myrtie Neither, MD     AC/MEDQ  D:  06/29/2011  T:  06/30/2011  Job:  [id number]

## 2011-06-30 NOTE — Progress Notes (Signed)
Subjective: Patient complained of a headache this morning.  Otherwise had no other specific concerns or complaints.  Had breakfast this morning.  Objective: Vital signs in last 24 hours: Filed Vitals:   06/30/11 0142 06/30/11 0459 06/30/11 0551 06/30/11 0843  BP: 138/72 142/78    Pulse: 82 89    Temp: 97.9 F (36.6 C) 98.1 F (36.7 C)    TempSrc: Oral Oral    Resp: 22 22  20   Height:   5\' 10"  (1.778 m)   Weight:   131.543 kg (290 lb)   SpO2: 97% 96%  98%   Weight change:   Intake/Output Summary (Last 24 hours) at 06/30/11 0926 Last data filed at 06/30/11 0500  Gross per 24 hour  Intake   1950 ml  Output   1000 ml  Net    950 ml    Physical Exam: General: Awake, Oriented, No acute distress. HEENT: EOMI, nasal cannula in place. Neck: Supple CV: S1 and S2 Lungs: Clear to ascultation bilaterally Abdomen: Soft, Nontender, Nondistended, +bowel sounds. Ext: Good pulses. Trace edema.  Lab Results:  [facility] 06/29/11 1033  NA 139  K 3.5  CL 100  CO2 25  GLUCOSE 93  BUN 15  CREATININE 0.64  CALCIUM 10.1  MG --  PHOS --   No results found for this basename: AST:2,ALT:2,ALKPHOS:2,BILITOT:2,PROT:2,ALBUMIN:2 in the last 72 hours No results found for this basename: LIPASE:2,AMYLASE:2 in the last 72 hours  Basename 06/29/11 1033  WBC 9.6  NEUTROABS --  HGB 10.4*  HCT 33.8*  MCV 84.5  PLT 465*   No results found for this basename: CKTOTAL:3,CKMB:3,CKMBINDEX:3,TROPONINI:3 in the last 72 hours No components found with this basename: POCBNP:3 No results found for this basename: DDIMER:2 in the last 72 hours No results found for this basename: HGBA1C:2 in the last 72 hours No results found for this basename: CHOL:2,HDL:2,LDLCALC:2,TRIG:2,CHOLHDL:2,LDLDIRECT:2 in the last 72 hours No results found for this basename: TSH,T4TOTAL,FREET3,T3FREE,THYROIDAB in the last 72 hours No results found for this basename: VITAMINB12:2,FOLATE:2,FERRITIN:2,TIBC:2,IRON:2,RETICCTPCT:2 in  the last 72 hours  Micro Results: No results found for this or any previous visit (from the past 240 hour(s)).  Studies/Results: Dg Chest 2 View  06/29/2011  *RADIOLOGY REPORT*  Clinical Data: Preop right ankle fracture.  Prior smoker.  CHEST - 2 VIEW  Comparison: 08/20/2009  Findings: Heart is borderline enlarged.  No confluent airspace opacities or effusions.  Degenerative changes in the shoulders.  No acute bony abnormality.  IMPRESSION: Borderline cardiomegaly.  No acute findings.  Original Report Authenticated By: Cyndie Chime, M.D.   Dg Ankle Complete Right  06/29/2011  *RADIOLOGY REPORT*  Clinical Data: Postreduction.  RIGHT ANKLE - COMPLETE 3+ VIEW  Comparison: 06/29/2011  Findings: Interval reduction of the right ankle fracture dislocation.  Continued widening of the ankle mortise medially. Mild continued displacement of the distal fibular fracture.  IMPRESSION: Interval reduction of the fracture dislocation as above.  Original Report Authenticated By: Cyndie Chime, M.D.   Dg Ankle Complete Right  06/29/2011  *RADIOLOGY REPORT*  Clinical Data: Fall.  Ankle deformity.  RIGHT ANKLE - COMPLETE 3+ VIEW  Comparison: None.  Findings: Unstable Weber B (Lauge Hansen SER stage 4) fracture dislocation of the right ankle noted, with considerable lateral displacement of the talus with respect to the dominant tibial and fibular shaft fragments, and with some outward rotation of the talus and foot with respect to the plafond.  Medial malleolar fracture, if present, is subtle and accordingly the deltoid ligament is  likely torn.  There is a suggestion of pes planus on the lateral projection. Plantar calcaneal spur and midfoot spurring noted.  There is poor definition of the subtalar joints, probably from rotation, although a fracture along the subtalar joints cannot be readily excluded.  There is also a mildly widened appearance of the talonavicular articulation.  IMPRESSION:  1.  Supination - external  rotation fracture-dislocation of the right ankle as described above. 2.  Given the degree of angulation on the lateral projection, it is difficult to exclude fracture along the subtalar joints.  This could be reassessed on post reduction views, or with CT. 3.  Suspected mild widening of the talonavicular joint. 4.  Midfoot spurring.  Original Report Authenticated By: Dellia Cloud, M.D.    Medications: I have reviewed the patient's current medications. Scheduled Meds:   . amLODipine  10 mg Oral Daily  . atorvastatin  40 mg Oral q1800  .  ceFAZolin (ANCEF) IV  1 g Intravenous Q8H  .  ceFAZolin (ANCEF) IV  2 g Intravenous 60 min Pre-Op  . chlorhexidine  60 mL Topical Once  . HYDROmorphone      .  HYDROmorphone (DILAUDID) injection  1 mg Intravenous Once  . HYDROmorphone PCA 0.3 mg/mL   Intravenous Q4H  . HYDROmorphone PCA 0.3 mg/mL      . insulin aspart  0-15 Units Subcutaneous TID WC  . insulin aspart  4 Units Subcutaneous TID WC  . ketorolac  15 mg Intravenous Once  . mometasone-formoterol  2 puff Inhalation BID  . mulitivitamin with minerals  1 tablet Oral Daily  . ondansetron      . ondansetron (ZOFRAN) IV  8 mg Intravenous Once  . ondansetron (ZOFRAN) IV  4 mg Intravenous Once  . propofol  20 mL Intravenous Once  . sertraline  50 mg Oral Daily  . DISCONTD: acetaminophen  1,000 mg Intravenous Q6H  . DISCONTD: amLODipine  5 mg Oral Daily   Continuous Infusions:   . sodium chloride 100 mL/hr at 06/29/11 2323  . DISCONTD: lactated ringers    . DISCONTD: lactated ringers     PRN Meds:.acetaminophen, diphenhydrAMINE, diphenhydrAMINE, morphine injection, naloxone, ondansetron (ZOFRAN) IV, ondansetron (ZOFRAN) IV, sodium chloride, zolpidem, DISCONTD: midazolam  Assessment/Plan: Fracture of right ankle Management as per orthopedic service Dr. Montez Morita.  Patient is status post open reduction internal fixation of right ankle and repair of deltoid ligament on 06/29/2011  Type 2  diabetes Metformin held.  Currently on sliding scale insulin.  Resume metformin at discharge.  Hypertension Mildly elevated likely due to pain.  Continue amlodipine.  Hyperlipidemia Continue statin.  Anemia Etiology unclear.  Will send anemia panel.  Further management as per primary care physician as outpatient.  GERD Stable.  Obstructive sleep apnea Patient to use CPAP as tolerated.  Currently on oxygen.  Wean off oxygen as tolerated.  Prophylaxis As per primary service.  Disposition Pending PT/OT evaluation.   LOS: 1 day  Kitty Cadavid A, MD 06/30/2011, 9:26 AM

## 2011-06-30 NOTE — Evaluation (Signed)
Physical Therapy Evaluation Patient Details Name: Alexandria Ruiz MRN: [id number] DOB: January 06, 1956 Today's Date: 06/30/2011  Problem List:  Patient Active Problem List  Diagnoses  . DIABETES MELLITUS  . HYPERLIPIDEMIA  . ANEMIA  . ANXIETY  . HYPERTENSION  . RHINOSINUSITIS, CHRONIC  . GERD  . PRURITUS  . CERVICALGIA  . LOW BACK PAIN, CHRONIC  . Hypertension  . Fracture of right ankle    Past Medical History:  Past Medical History  Diagnosis Date  . Diabetes mellitus   . Hypertension   . PONV (postoperative nausea and vomiting)    Past Surgical History:  Past Surgical History  Procedure Date  . Abdominal hysterectomy   . Breast reduction surgery   . Hammer toe surgery     bilateral feet  . Nose surgery     sinus surgery    PT Assessment/Plan/Recommendation PT Assessment Clinical Impression Statement: Pt s/p R ankle ORIF.  Pt would benefit from acute PT in order to improve independence and safety with transfers, ambulation, and assistive devices in preparation for d/c home with spouse.  Mobility limited on eval 2* pt with nausea/vomiting upon sitting EOB. PT Recommendation/Assessment: Patient will need skilled PT in the acute care venue PT Problem List: Decreased activity tolerance;Decreased mobility;Decreased safety awareness;Decreased knowledge of use of DME;Pain PT Therapy Diagnosis : Difficulty walking;Acute pain PT Plan PT Frequency: Min 5X/week PT Treatment/Interventions: DME instruction;Gait training;Stair training;Functional mobility training;Patient/family education;Wheelchair mobility training;Therapeutic activities;Therapeutic exercise PT Recommendation Follow Up Recommendations: No PT follow up;Other (comment) (likely no f/u) Equipment Recommended: Rolling walker with 5" wheels;Other (comment);3 in 1 bedside comode;Wheelchair (measurements) (wide RW) PT Goals  Acute Rehab PT Goals PT Goal Formulation: With patient Time For Goal Achievement: 7 days Pt  will go Supine/Side to Sit: with supervision PT Goal: Supine/Side to Sit - Progress: Goal set today Pt will go Sit to Supine/Side: with supervision PT Goal: Sit to Supine/Side - Progress: Goal set today Pt will go Sit to Stand: with supervision PT Goal: Sit to Stand - Progress: Goal set today Pt will go Stand to Sit: with supervision PT Goal: Stand to Sit - Progress: Goal set today Pt will Transfer Bed to Chair/Chair to Bed: with supervision PT Transfer Goal: Bed to Chair/Chair to Bed - Progress: Goal set today Pt will Ambulate: 16 - 50 feet;with supervision;with rolling walker PT Goal: Ambulate - Progress: Goal set today Pt will Go Up / Down Stairs: 3-5 stairs;with least restrictive assistive device;Other (comment);with min assist (or educate spouse on w/c) PT Goal: Up/Down Stairs - Progress: Goal set today Pt will Perform Home Exercise Program: with supervision, verbal cues required/provided PT Goal: Perform Home Exercise Program - Progress: Goal set today Pt will Propel Wheelchair: 10 - 50 feet;with supervision PT Goal: Propel Wheelchair - Progress: Goal set today  PT Evaluation Precautions/Restrictions  Precautions Precautions: Fall Restrictions Weight Bearing Restrictions: Yes RLE Weight Bearing: Non weight bearing Prior Functioning  Home Living Lives With: Spouse Type of Home: House Home Access: Stairs to enter Secretary/administrator of Steps: 3 Home Layout: One level Home Adaptive Equipment: None Prior Function Level of Independence: Independent Cognition Cognition Arousal/Alertness: Awake/alert Overall Cognitive Status: Appears within functional limits for tasks assessed Sensation/Coordination   Extremity Assessment RLE Assessment RLE Assessment: Not tested (able to wiggle toes and SLR) LLE Assessment LLE Assessment: Within Functional Limits Mobility (including Balance) Bed Mobility Bed Mobility: Yes Supine to Sit: 4: Min assist;With rails;HOB elevated  (Comment degrees) Supine to Sit Details (indicate cue type and  reason): support for R LE, pt sat EOB for 5 min 2* vomiting (so BSC brought to pt instead of ambulating to bathroom) Sit to Supine: 4: Min assist;HOB elevated (comment degrees);With rail Sit to Supine - Details (indicate cue type and reason): support for R LE Transfers Transfers: Yes Sit to Stand: 4: Min assist;From bed;From chair/3-in-1 Sit to Stand Details (indicate cue type and reason): min/guard for safety, verbal cues for hand placement Stand to Sit: To bed;To chair/3-in-1;4: Min assist Stand to Sit Details: min/guard for safety, verbal cues for hand placement Stand Pivot Transfers: 4: Min assist Stand Pivot Transfer Details (indicate cue type and reason): min/guard for safety, verbal cues for hand placement, with RW Ambulation/Gait Ambulation/Gait: No (pt nausea/vomiting)    Exercise    End of Session PT - End of Session Activity Tolerance: Other (comment);Patient limited by pain (nausea/vomiting) Patient left: in bed;with call bell in reach Nurse Communication: Mobility status for transfers General Behavior During Session: [facility] for tasks performed Cognition: [facility] for tasks performed  Cataleah Stites,KATHrine E 06/30/2011, 2:45 PM Pager: [phone]

## 2011-06-30 NOTE — Op Note (Signed)
NAMECOLLETTA, SPILLERS NO.:  [id number]  MEDICAL RECORD NO.:  [id number]  LOCATION:  1310                         FACILITY:  [facility] ([place])  PHYSICIAN:  Myrtie Neither, MD      DATE OF BIRTH:  Oct 13, 1955  DATE OF PROCEDURE:  06/29/2011 DATE OF DISCHARGE:                              OPERATIVE REPORT   PREOPERATIVE DIAGNOSIS:  Fracture dislocation, right ankle.  POSTOPERATIVE DIAGNOSIS:  Fracture dislocation, right ankle, with complete disruption of deltoid ligament, fractured lateral malleolus, and chondral fragment fracture of the talus.  ANESTHESIA:  General.  PROCEDURES: 1. Open reduction and internal fixation, right ankle. 2. Repair of deltoid ligament.  PROCEDURE IN DETAIL:  The patient was taken to the operating room after given adequate preop medications, given general anesthesia, and intubated.  Right lower leg was prepped with DuraPrep and draped in a sterile manner.  Tourniquet was used for hemostasis.  Mini C-arm was used to visualize fracture reduction.  A Kocher incision was made over the medial malleolus going through the skin and subcutaneous tissues, down to the deltoid was completely disrupted.  Copious irrigation was done.  Hematoma was evacuated.  Inspection of the joint did reveal chondral fracture of the dome of the talus.  Loose fragments were removed.  Articular surface __________ was intact.  Two FiberWire sutures were placed across into the deltoid to reapproximate the deltoid ligament.  These sutures were left free and attention was then turned to the lateral malleolus.  Lateral incision was made over the lateral malleolus going through skin and subcutaneous tissue.  Soft tissue subperiosteal elevation was done.  Fracture was reduced and held with a bone clamp.  A 6-hole tubular plate was put in place and 5 screws placed across the fibula.  Anatomic reduction was obtained.  This was viewed in the AP, lateral, and oblique views.  After good  stabilization of the lateral malleolus, attention was then turned back to the deltoid and sutures were then with anchors tilted medially.  The sutures were then tied, offering a good stable repair.  Again, visualization revealed anatomic reduction of the fibula, restoring of the ankle mortise. Copious irrigation was then done.  2-0 Vicryl was used for the subcutaneous and skin staples for the skin.  Compressive dressing was applied.  Short-leg fiberglass was applied.  The patient tolerated procedure quite well, went to recovery room in stable and satisfactory condition.     Myrtie Neither, MD     AC/MEDQ  D:  06/29/2011  T:  06/30/2011  Job:  [id number]

## 2011-06-30 NOTE — Progress Notes (Signed)
Pt received to room 1310 via bed from PACU, after having repair of fractured right ankle. Pt is drowsy but arousable. Oriented to call light and pca dilaudid. Husband at bedside. Alexandria Ruiz

## 2011-07-01 ENCOUNTER — Other Ambulatory Visit: Payer: Self-pay | Admitting: Orthopedic Surgery

## 2011-07-01 LAB — GLUCOSE, CAPILLARY
Glucose-Capillary: 128 mg/dL — ABNORMAL HIGH (ref 70–99)
Glucose-Capillary: 128 mg/dL — ABNORMAL HIGH (ref 70–99)
Glucose-Capillary: 145 mg/dL — ABNORMAL HIGH (ref 70–99)

## 2011-07-01 LAB — CBC
MCHC: 30.7 g/dL (ref 30.0–36.0)
Platelets: ADEQUATE 10*3/uL (ref 150–400)
RDW: 16.1 % — ABNORMAL HIGH (ref 11.5–15.5)
WBC: 8.9 10*3/uL (ref 4.0–10.5)

## 2011-07-01 LAB — RETICULOCYTES
RBC.: 3.49 MIL/uL — ABNORMAL LOW (ref 3.87–5.11)
Retic Count, Absolute: 62.8 10*3/uL (ref 19.0–186.0)
Retic Ct Pct: 1.8 % (ref 0.4–3.1)

## 2011-07-01 MED ORDER — OXYCODONE-ACETAMINOPHEN 7.5-325 MG PO TABS: 1.0000 | ORAL_TABLET | ORAL | Status: DC | PRN

## 2011-07-01 MED ORDER — HYDROMORPHONE 0.3 MG/ML IV SOLN
INTRAVENOUS | Status: AC
  Administered 2011-07-01: 04:00:00
  Filled 2011-07-01: qty 25

## 2011-07-01 MED ORDER — OXYCODONE-ACETAMINOPHEN 5-325 MG PO TABS
1.0000 | ORAL_TABLET | ORAL | Status: DC | PRN
  Administered 2011-07-01: 1 via ORAL
  Administered 2011-07-01: 2 via ORAL
  Administered 2011-07-02 (×3): 1 via ORAL
  Filled 2011-07-01 (×4): qty 1
  Filled 2011-07-01: qty 2

## 2011-07-01 NOTE — Progress Notes (Unsigned)
AFEBRILE , PAIN UNDER CONTROL, WILL D/C PCA AND START PERCOCET , D/C IV MEDS , AND IV'S, PLAN DISCHARGE IN AM.

## 2011-07-01 NOTE — Progress Notes (Addendum)
Physical Therapy Treatment Patient Details Name: Alexandria Ruiz MRN: [id number] DOB: 1956/01/17 Today's Date: 07/01/2011  PT Assessment/Plan  PT - Assessment/Plan Comments on Treatment Session: Patient sat in bathroom to wash up and brush teeth.  HR 129 after walking back from bathroom, SpO2 95% after amb on room air.  Discussed with patient/spouse possible need for wheelchair at d/c and they agree.   PT Plan: Discharge plan needs to be updated PT Frequency: Min 5X/week Follow Up Recommendations: Home health PT PT Goals  Acute Rehab PT Goals Pt will go Supine/Side to Sit: with supervision PT Goal: Supine/Side to Sit - Progress: Progressing toward goal Pt will go Sit to Supine/Side: with supervision PT Goal: Sit to Supine/Side - Progress: Progressing toward goal Pt will go Sit to Stand: with supervision PT Goal: Sit to Stand - Progress: Progressing toward goal Pt will go Stand to Sit: with supervision PT Goal: Stand to Sit - Progress: Progressing toward goal Pt will Ambulate: 16 - 50 feet;with supervision;with rolling walker PT Goal: Ambulate - Progress: Progressing toward goal  PT Treatment Precautions/Restrictions  Precautions Precautions: Fall Restrictions Weight Bearing Restrictions: Yes RLE Weight Bearing: Non weight bearing Mobility (including Balance) Bed Mobility Supine to Sit: 4: Min assist Supine to Sit Details (indicate cue type and reason): assist to lift trunk Sit to Supine: 4: Min assist Sit to Supine - Details (indicate cue type and reason): for right LE Transfers Transfers: Yes Sit to Stand: 4: Min assist;From bed Sit to Stand Details (indicate cue type and reason): lifting assist and cues for hand placement Stand to Sit: 5: Supervision;To chair/3-in-1 Stand to Sit Details: to 3:1 over toilet, min assist to sit on bed uncontrolled due to pain/fatigue Balance: Patient stood to wash peri area with min assist with intermittent 1 vs no UE assist.  One LOB with  min assist to recover. Ambulation/Gait Ambulation/Gait: Yes Ambulation/Gait Assistance: 4: Min assist Ambulation/Gait Assistance Details (indicate cue type and reason): c/o left LE fatigue with hopping Ambulation Distance (Feet): 12 Feet ( times two...to and from bathroom) Assistive device: Rolling walker    Exercise    End of Session PT - End of Session Equipment Utilized During Treatment: Gait belt Activity Tolerance: Patient limited by fatigue Patient left: in bed;with call bell in reach;with family/visitor present General Behavior During Session: [facility] for tasks performed Cognition: [facility] for tasks performed  [place] 07/01/2011, 1:30 PM

## 2011-07-01 NOTE — Progress Notes (Signed)
Subjective: No specific complaints.  Feeling nauseated from IV Dilaudid.  Objective: Vital signs in last 24 hours: Filed Vitals:   07/01/11 0421 07/01/11 0500 07/01/11 0754 07/01/11 0837  BP:  119/77    Pulse:  101    Temp:  99.5 F (37.5 C)    TempSrc:  Oral    Resp:  22  24  Height:      Weight:      SpO2: 98% 93% 92% 93%   Weight change:   Intake/Output Summary (Last 24 hours) at 07/01/11 1045 Last data filed at 07/01/11 0500  Gross per 24 hour  Intake    480 ml  Output   1100 ml  Net   -620 ml    Physical Exam: General: Awake, Oriented, No acute distress. HEENT: EOMI, nasal cannula in place. Neck: Supple CV: S1 and S2 Lungs: Clear to ascultation bilaterally Abdomen: Soft, Nontender, Nondistended, +bowel sounds. Ext: Good pulses. Trace edema.  Right lower extremity in cast.  Lab Results:  [facility] 06/29/11 1033  NA 139  K 3.5  CL 100  CO2 25  GLUCOSE 93  BUN 15  CREATININE 0.64  CALCIUM 10.1  MG --  PHOS --   No results found for this basename: AST:2,ALT:2,ALKPHOS:2,BILITOT:2,PROT:2,ALBUMIN:2 in the last 72 hours No results found for this basename: LIPASE:2,AMYLASE:2 in the last 72 hours  Basename 07/01/11 0545 06/29/11 1033  WBC 8.9 9.6  NEUTROABS -- --  HGB 9.1* 10.4*  HCT 29.6* 33.8*  MCV 84.8 84.5  PLT PLATELET CLUMPS NOTED ON SMEAR, COUNT APPEARS ADEQUATE 465*   No results found for this basename: CKTOTAL:3,CKMB:3,CKMBINDEX:3,TROPONINI:3 in the last 72 hours No components found with this basename: POCBNP:3 No results found for this basename: DDIMER:2 in the last 72 hours No results found for this basename: HGBA1C:2 in the last 72 hours No results found for this basename: CHOL:2,HDL:2,LDLCALC:2,TRIG:2,CHOLHDL:2,LDLDIRECT:2 in the last 72 hours No results found for this basename: TSH,T4TOTAL,FREET3,T3FREE,THYROIDAB in the last 72 hours  Basename 07/01/11 0545  VITAMINB12 --  FOLATE --  FERRITIN --  TIBC --  IRON --  RETICCTPCT 1.8     Micro Results: No results found for this or any previous visit (from the past 240 hour(s)).  Studies/Results: Dg Chest 2 View  06/29/2011  *RADIOLOGY REPORT*  Clinical Data: Preop right ankle fracture.  Prior smoker.  CHEST - 2 VIEW  Comparison: 08/20/2009  Findings: Heart is borderline enlarged.  No confluent airspace opacities or effusions.  Degenerative changes in the shoulders.  No acute bony abnormality.  IMPRESSION: Borderline cardiomegaly.  No acute findings.  Original Report Authenticated By: Cyndie Chime, M.D.    Medications: I have reviewed the patient's current medications. Scheduled Meds:    . amLODipine  10 mg Oral Daily  . atorvastatin  40 mg Oral q1800  .  ceFAZolin (ANCEF) IV  1 g Intravenous Q8H  . HYDROmorphone PCA 0.3 mg/mL   Intravenous Q4H  . HYDROmorphone PCA 0.3 mg/mL      . insulin aspart  0-15 Units Subcutaneous TID WC  . insulin aspart  4 Units Subcutaneous TID WC  . mometasone-formoterol  2 puff Inhalation BID  . mulitivitamin with minerals  1 tablet Oral Daily  . sertraline  50 mg Oral Daily   Continuous Infusions:    . sodium chloride 15 mL/hr at 06/30/11 1131   PRN Meds:.acetaminophen, diphenhydrAMINE, diphenhydrAMINE, morphine injection, naloxone, ondansetron (ZOFRAN) IV, ondansetron (ZOFRAN) IV, sodium chloride, zolpidem  Assessment/Plan: Fracture of right ankle Management as  per orthopedic service Dr. Montez Morita.  Patient is status post open reduction internal fixation of right ankle and repair of deltoid ligament on 06/29/2011  Type 2 diabetes Metformin held.  Currently on sliding scale insulin.  Resume metformin at discharge.  Hypertension Mildly elevated likely due to pain.  Continue amlodipine.  Hyperlipidemia Continue statin.  Anemia Etiology unclear, presumed to be from acute blood loss anemia from fracture of right ankle.  Anemia panel counseled on a lab for unclear reasons.  Further management as per primary care physician as  outpatient.  GERD Stable.  Obstructive sleep apnea Patient to use CPAP as tolerated.  Currently on oxygen.  Wean off oxygen as tolerated.  Prophylaxis As per primary service.   LOS: 2 days  Sharilynn Cassity A, MD 07/01/2011, 10:45 AM

## 2011-07-02 MED ORDER — WARFARIN VIDEO: Freq: Once | Status: DC

## 2011-07-02 MED ORDER — PATIENT'S GUIDE TO USING COUMADIN BOOK
Freq: Once | Status: AC
  Administered 2011-07-02: 09:00:00
  Filled 2011-07-02: qty 1

## 2011-07-02 MED ORDER — OXYCODONE-ACETAMINOPHEN 5-325 MG PO TABS: 1.0000 | ORAL_TABLET | ORAL | Status: AC | PRN

## 2011-07-02 MED ORDER — WARFARIN SODIUM 1 MG PO TABS: 5.0000 mg | ORAL_TABLET | Freq: Every day | ORAL | Status: DC

## 2011-07-02 NOTE — Progress Notes (Signed)
Brief pharmacy note:  Coumadin per pharmacy ordered this morning but patient is being discharged. Educated patient on Coumadin. Starting 5mg  daily upon discharge per MD order. Patient was instructed to call Dr. Celene Skeen office to schedule blood draw for PT/INR on Monday 07/05/11.    Charolotte Eke, PharmD, pager [phone]. 07/02/2011,9:16 AM.

## 2011-07-02 NOTE — Progress Notes (Signed)
CARE MANAGEMENT NOTE 07/02/2011  Patient:  Alexandria Ruiz, Alexandria Ruiz   Account Number:  [id number]  Date Initiated:  07/02/2011  Documentation initiated by:  Marcellino Fidalgo  Subjective/Objective Assessment:   56 yo female admitted 06/29/11 with ankle pain     Action/Plan:   D/C when medically stable   Anticipated DC Date:  07/02/2011   Anticipated DC Plan:  HOME/SELF CARE      DC Planning Services  CM consult      PAC Choice  DURABLE MEDICAL EQUIPMENT     DME arranged  3-N-1  Dan Humphreys      DME agency  [facility]        Status of service:  Completed, signed off  Discharge Disposition:  HOME/SELF CARE  Comments:  07/02/11, Kathi Der RNC-MNN, BSN, [phone], CM received referral.  CM called Lucretia with [facility] concerning DME order.  Confirmation of order received.  Equipment delivered to room.

## 2011-07-02 NOTE — Progress Notes (Signed)
Subjective: Intermittent pain from her leg.  No other specific complaints.  Objective: Vital signs in last 24 hours: Filed Vitals:   07/01/11 2025 07/01/11 2100 07/01/11 2245 07/02/11 0500  BP:   103/67 142/80  Pulse: 92 90 88 95  Temp: 98.8 F (37.1 C)   98.3 F (36.8 C)  TempSrc: Oral   Oral  Resp: 19   18  Height:      Weight:      SpO2: 92%   94%   Weight change:   Intake/Output Summary (Last 24 hours) at 07/02/11 0947 Last data filed at 07/02/11 0849  Gross per 24 hour  Intake 2371.34 ml  Output    900 ml  Net 1471.34 ml    Physical Exam: General: Awake, Oriented, No acute distress. HEENT: EOMI, nasal cannula in place. Neck: Supple CV: S1 and S2 Lungs: Clear to ascultation bilaterally Abdomen: Soft, Nontender, Nondistended, +bowel sounds. Ext: Good pulses. Trace edema.  Right lower extremity in cast.  Lab Results:  [facility] 06/29/11 1033  NA 139  K 3.5  CL 100  CO2 25  GLUCOSE 93  BUN 15  CREATININE 0.64  CALCIUM 10.1  MG --  PHOS --   No results found for this basename: AST:2,ALT:2,ALKPHOS:2,BILITOT:2,PROT:2,ALBUMIN:2 in the last 72 hours No results found for this basename: LIPASE:2,AMYLASE:2 in the last 72 hours  Basename 07/01/11 0545 06/29/11 1033  WBC 8.9 9.6  NEUTROABS -- --  HGB 9.1* 10.4*  HCT 29.6* 33.8*  MCV 84.8 84.5  PLT PLATELET CLUMPS NOTED ON SMEAR, COUNT APPEARS ADEQUATE 465*   No results found for this basename: CKTOTAL:3,CKMB:3,CKMBINDEX:3,TROPONINI:3 in the last 72 hours No components found with this basename: POCBNP:3 No results found for this basename: DDIMER:2 in the last 72 hours No results found for this basename: HGBA1C:2 in the last 72 hours No results found for this basename: CHOL:2,HDL:2,LDLCALC:2,TRIG:2,CHOLHDL:2,LDLDIRECT:2 in the last 72 hours No results found for this basename: TSH,T4TOTAL,FREET3,T3FREE,THYROIDAB in the last 72 hours  Basename 07/01/11 0545  VITAMINB12 --  FOLATE --  FERRITIN --  TIBC --    IRON --  RETICCTPCT 1.8    Micro Results: No results found for this or any previous visit (from the past 240 hour(s)).  Studies/Results: No results found.  Medications: I have reviewed the patient's current medications. Scheduled Meds:    . amLODipine  10 mg Oral Daily  . atorvastatin  40 mg Oral q1800  .  ceFAZolin (ANCEF) IV  1 g Intravenous Q8H  . insulin aspart  0-15 Units Subcutaneous TID WC  . insulin aspart  4 Units Subcutaneous TID WC  . mometasone-formoterol  2 puff Inhalation BID  . mulitivitamin with minerals  1 tablet Oral Daily  . patient's guide to using coumadin book   Does not apply Once  . sertraline  50 mg Oral Daily  . warfarin   Does not apply Once  . DISCONTD: HYDROmorphone PCA 0.3 mg/mL   Intravenous Q4H   Continuous Infusions:    . sodium chloride 15 mL/hr at 06/30/11 1131   PRN Meds:.acetaminophen, diphenhydrAMINE, diphenhydrAMINE, naloxone, ondansetron (ZOFRAN) IV, ondansetron (ZOFRAN) IV, oxyCODONE-acetaminophen, sodium chloride, zolpidem, DISCONTD:  morphine injection  Assessment/Plan: Fracture of right ankle Management as per orthopedic service Dr. Montez Morita.  Patient is status post open reduction internal fixation of right ankle and repair of deltoid ligament on 06/29/2011  Type 2 diabetes Metformin held.  Currently on sliding scale insulin.  Resume metformin at discharge.  Hypertension Mildly elevated likely due to pain.  Continue amlodipine.  Hyperlipidemia Continue statin.  Anemia Etiology unclear, presumed to be from acute blood loss anemia from fracture of right ankle.  Anemia panel counseled on a lab for unclear reasons.  Instructed the patient to followup with her PCP in 1 month to have CBC checked.   GERD Stable.  Obstructive sleep apnea Patient to use CPAP as tolerated.  Wean off oxygen as tolerated.  Prophylaxis As per primary service.  Will sign off as patient is medically stable. Please do not hesitate to contact us  with any questions or concerns.   LOS: 3 days  Trinity Haun A, MD 07/02/2011, 9:47 AM

## 2011-07-02 NOTE — Progress Notes (Signed)
Physical Therapy Treatment Patient Details Name: Alexandria Ruiz MRN: [id number] DOB: 19-Sep-1955 Today's Date: 07/02/2011 Time: 7425-9563 PT Time Calculation (min): 27 min  PT Assessment / Plan / Recommendation Clinical Impression  Pt s/p R ankle ORIF.  Pt would benefit from acute PT in order to improve independence and safety with transfers, ambulation, and assistive devices in preparation for d/c home with spouse.  Mobility limited on eval 2* pt with nausea/vomiting upon sitting EOB.    Follow Up Recommendations  No PT follow up    Equipment Recommendations  Other (comment) (knee scooter)    Frequency Min 5X/week    Precautions / Restrictions Precautions Precautions: Fall Restrictions Weight Bearing Restrictions: Yes RLE Weight Bearing: Non weight bearing       Mobility  Bed Mobility Bed Mobility DO NOT USE: Yes Supine to Sit: 4: Min assist Sit to Supine: 4: Min assist Transfers Sit to Stand: 5: Supervision;From chair/3-in-1 Stand to Sit: 5: Supervision;With upper extremity assist;To chair/3-in-1 Stand Pivot Transfers: 4: Min assist Details for Transfer Assistance: supervision for safety especially stand to sit due to fatigue with uncontrolled descent Ambulation/Gait Ambulation/Gait Assistance: 5: Supervision Ambulation Distance (Feet): 45 Feet Assistive device:  (knee scooter) Ambulation/Gait Assistance Details: cues for safety with use of brakes and sequence Gait Pattern: Step-to pattern General Gait Details: improved with safety with ability to weight bear on right knee, also less fatigue reported with ability to walk further Stairs:  (refused to try crutch & rail; educ. w/spouse rail & assist)    Exercises     PT Goals Acute Rehab PT Goals Time For Goal Achievement: 07/07/11 Pt will go Stand to Sit: with supervision PT Goal: Stand to Sit - Progress: Met Pt will Ambulate: 16 - 50 feet;with supervision;with rolling walker PT Goal: Ambulate - Progress: Met  (with knee scooter) PT Goal: Propel Wheelchair - Progress: Discontinued (comment) (plans to walk with knee walker)  Visit Information  Last PT Received On: 07/02/11    Subjective Data  Subjective: Home today.  Want to try out knee scooter   Cognition  Overall Cognitive Status: Appears within functional limits for tasks assessed/performed Arousal/Alertness: Awake/alert Behavior During Session: [facility] for tasks performed    Balance  Balance Balance Assessed: Yes Dynamic Standing Balance Dynamic Standing - Balance Support: No upper extremity supported;Left upper extremity supported;During functional activity Dynamic Standing - Level of Assistance: 4: Min assist;5: Stand by assistance Dynamic Standing - Comments: while washing periarea  End of Session PT - End of Session Activity Tolerance: Patient tolerated treatment well Patient left: in chair;with family/visitor present    [facility] 07/02/2011, 10:22 AM

## 2011-07-02 NOTE — Discharge Summary (Signed)
NAMEALYZZA, ANDRINGA NO.:  [id number]  MEDICAL RECORD NO.:  [id number]  LOCATION:  1310                         FACILITY:  [facility]  PHYSICIAN:  Myrtie Neither, MD      DATE OF BIRTH:  1956/02/02  DATE OF ADMISSION:  06/29/2011 DATE OF DISCHARGE:  07/02/2011                              DISCHARGE SUMMARY   ADMITTING DIAGNOSES: 1. Fracture dislocation, right ankle. 2. History of hypertension. 3. History of diabetes mellitus. 4. Hypercholesterolemia. 5. Asthma.  DISCHARGE DIAGNOSES: 1. Fracture dislocation, right ankle. 2. History of hypertension. 3. History of diabetes mellitus. 4. Hypercholesterolemia. 5. Asthma.  COMPLICATIONS:  None.  INFECTIONS:  None.  OPERATION:  Open reduction and internal fixation of right ankle.  PERTINENT HISTORY:  This is a 56 year old female, who had lost her footing on a wet surface outside of her house and turned her right ankle.  The patient was brought to [facility] Emergency Room and was found to have fracture dislocation of the right ankle.  PERTINENT PHYSICAL EXAMINATION:  Pertinent physical was that her right ankle is grossly deformed.  The tendon is swollen.  Neurovascular status is intact.  X-rays revealed no fracture, lateral malleolus, and dislocation of the joint.  HOSPITAL COURSE:  The patient underwent preop laboratory, CBC, EKG, chest x-ray, CMET, UA.  The patient's labs were found to be stable enough to undergo surgery.  The patient did undergo ORIF of the right ankle and repair of deltoid ligament, and application of short leg cast. The patient was seen by an in-house physician for postop respiratory difficulty due to her asthma.  The patient's hospital course has been fairly benign, started on physical therapy, nonweightbearing on the right side, use of a walker.  Pain is brought under control. Neurovascular status is intact.  Patient is stable enough to be discharged home.  The patient also will be  on Coumadin per pharmacy, and we will have weekly INRs done.  The patient is to return to the office in 1 week.  The patient is being discharged in stable and satisfactory condition.     Myrtie Neither, MD     AC/MEDQ  D:  07/02/2011  T:  07/02/2011  Job:  [id number]

## 2011-07-02 NOTE — Progress Notes (Signed)
Pt D/C home, Pt is stable with no new complains. D/C instructions and medication administration instructions done. Pt verbalizes understanding.

## 2011-07-12 ENCOUNTER — Encounter (HOSPITAL_COMMUNITY): Payer: Self-pay | Admitting: Orthopedic Surgery

## 2011-07-28 ENCOUNTER — Ambulatory Visit
Admission: RE | Admit: 2011-07-28 | Discharge: 2011-07-28 | Disposition: A | Discharge status: Home / Self Care | Payer: 59 | Source: Ambulatory Visit | Attending: Orthopedic Surgery | Admitting: Orthopedic Surgery

## 2011-07-28 ENCOUNTER — Other Ambulatory Visit: Payer: Self-pay | Admitting: Orthopedic Surgery

## 2011-07-28 DIAGNOSIS — R52 Pain, unspecified: Secondary | ICD-10-CM

## 2011-08-17 ENCOUNTER — Ambulatory Visit: Payer: 59 | Attending: Orthopedic Surgery | Admitting: Rehabilitative and Restorative Service Providers"

## 2011-08-17 DIAGNOSIS — M25579 Pain in unspecified ankle and joints of unspecified foot: Secondary | ICD-10-CM | POA: Insufficient documentation

## 2011-08-17 DIAGNOSIS — IMO0001 Reserved for inherently not codable concepts without codable children: Secondary | ICD-10-CM | POA: Insufficient documentation

## 2011-08-24 ENCOUNTER — Ambulatory Visit: Payer: 59

## 2011-08-31 ENCOUNTER — Ambulatory Visit: Payer: 59 | Admitting: Rehabilitative and Restorative Service Providers"

## 2011-09-07 ENCOUNTER — Ambulatory Visit: Payer: 59

## 2011-09-15 ENCOUNTER — Ambulatory Visit: Payer: 59 | Attending: Orthopedic Surgery | Admitting: Rehabilitative and Restorative Service Providers"

## 2011-09-15 DIAGNOSIS — M25579 Pain in unspecified ankle and joints of unspecified foot: Secondary | ICD-10-CM | POA: Insufficient documentation

## 2011-09-15 DIAGNOSIS — IMO0001 Reserved for inherently not codable concepts without codable children: Secondary | ICD-10-CM | POA: Insufficient documentation

## 2012-02-29 ENCOUNTER — Other Ambulatory Visit: Payer: Self-pay | Admitting: Orthopedic Surgery

## 2012-02-29 ENCOUNTER — Ambulatory Visit
Admission: RE | Admit: 2012-02-29 | Discharge: 2012-02-29 | Disposition: A | Discharge status: Home / Self Care | Payer: 59 | Source: Ambulatory Visit | Attending: Orthopedic Surgery | Admitting: Orthopedic Surgery

## 2012-02-29 DIAGNOSIS — M65969 Unspecified synovitis and tenosynovitis, unspecified lower leg: Secondary | ICD-10-CM

## 2012-02-29 DIAGNOSIS — M659 Synovitis and tenosynovitis, unspecified: Secondary | ICD-10-CM

## 2012-06-22 ENCOUNTER — Ambulatory Visit
Admission: RE | Admit: 2012-06-22 | Discharge: 2012-06-22 | Disposition: A | Discharge status: Home / Self Care | Payer: 59 | Source: Ambulatory Visit | Attending: Allergy and Immunology | Admitting: Allergy and Immunology

## 2012-06-22 ENCOUNTER — Other Ambulatory Visit: Payer: Self-pay | Admitting: Allergy and Immunology

## 2012-06-22 DIAGNOSIS — R0602 Shortness of breath: Secondary | ICD-10-CM

## 2012-06-26 ENCOUNTER — Emergency Department (HOSPITAL_COMMUNITY): Payer: 59

## 2012-06-26 ENCOUNTER — Emergency Department (HOSPITAL_COMMUNITY)
Admission: EM | Admit: 2012-06-26 | Discharge: 2012-06-26 | Disposition: A | Discharge status: Home / Self Care | Payer: 59 | Attending: Emergency Medicine | Admitting: Emergency Medicine

## 2012-06-26 ENCOUNTER — Encounter (HOSPITAL_COMMUNITY): Payer: Self-pay | Admitting: *Deleted

## 2012-06-26 DIAGNOSIS — Z8719 Personal history of other diseases of the digestive system: Secondary | ICD-10-CM | POA: Insufficient documentation

## 2012-06-26 DIAGNOSIS — I10 Essential (primary) hypertension: Secondary | ICD-10-CM | POA: Insufficient documentation

## 2012-06-26 DIAGNOSIS — Z87891 Personal history of nicotine dependence: Secondary | ICD-10-CM | POA: Insufficient documentation

## 2012-06-26 DIAGNOSIS — J45901 Unspecified asthma with (acute) exacerbation: Secondary | ICD-10-CM

## 2012-06-26 DIAGNOSIS — Z79899 Other long term (current) drug therapy: Secondary | ICD-10-CM | POA: Insufficient documentation

## 2012-06-26 DIAGNOSIS — E119 Type 2 diabetes mellitus without complications: Secondary | ICD-10-CM | POA: Insufficient documentation

## 2012-06-26 HISTORY — DX: Unspecified asthma, uncomplicated: J45.909

## 2012-06-26 LAB — D-DIMER, QUANTITATIVE: D-Dimer, Quant: <0.27 ug/mL-FEU (ref 0.00–0.48)

## 2012-06-26 LAB — CBC WITH DIFFERENTIAL/PLATELET
Basophils Relative: 0 % (ref 0–1)
Eosinophils Absolute: 0 10*3/uL (ref 0.0–0.7)
Eosinophils Relative: 0 % (ref 0–5)
Hemoglobin: 8.8 g/dL — ABNORMAL LOW (ref 12.0–15.0)
Lymphocytes Relative: 13 % (ref 12–46)
MCH: 20.3 pg — ABNORMAL LOW (ref 26.0–34.0)
Monocytes Absolute: 0.5 10*3/uL (ref 0.1–1.0)
Neutrophils Relative %: 83 % — ABNORMAL HIGH (ref 43–77)
Platelets: 518 10*3/uL — ABNORMAL HIGH (ref 150–400)
RBC: 4.34 MIL/uL (ref 3.87–5.11)

## 2012-06-26 LAB — BASIC METABOLIC PANEL
Calcium: 9.5 mg/dL (ref 8.4–10.5)
Chloride: 100 mEq/L (ref 96–112)
Creatinine, Ser: 0.69 mg/dL (ref 0.50–1.10)
GFR calc Af Amer: >90 mL/min (ref >90)
Sodium: 138 mEq/L (ref 135–145)

## 2012-06-26 LAB — PRO B NATRIURETIC PEPTIDE: Pro B Natriuretic peptide (BNP): 140 pg/mL — ABNORMAL HIGH (ref 0–125)

## 2012-06-26 LAB — TROPONIN I: Troponin I: <0.3 ng/mL (ref <0.30)

## 2012-06-26 MED ORDER — ALBUTEROL SULFATE (5 MG/ML) 0.5% IN NEBU
5.0000 mg | INHALATION_SOLUTION | Freq: Once | RESPIRATORY_TRACT | Status: AC
  Administered 2012-06-26: 5 mg via RESPIRATORY_TRACT
  Filled 2012-06-26: qty 1

## 2012-06-26 MED ORDER — IPRATROPIUM BROMIDE 0.02 % IN SOLN
0.5000 mg | Freq: Once | RESPIRATORY_TRACT | Status: AC
  Administered 2012-06-26: 0.5 mg via RESPIRATORY_TRACT
  Filled 2012-06-26: qty 2.5

## 2012-06-26 NOTE — ED Notes (Signed)
saO2 100% while ambulating

## 2012-06-26 NOTE — ED Notes (Signed)
Pt states she is short of breath, when asked how long it's been going on she states "a while", went and saw asthma doctor Wednesday, had xray done and was told lungs looked fine but she has an enlarged heart, states still short of breath and cannot wait till Wednesday to go back to doctor, pt in no respiratory distress at this time.

## 2012-06-26 NOTE — ED Notes (Signed)
MD at bedside. 

## 2012-06-26 NOTE — ED Provider Notes (Signed)
History     CSN: [id number]  Arrival date & time 06/26/12  4098   First MD Initiated Contact with Patient 06/26/12 1511      Chief Complaint  Patient presents with  . Shortness of Breath     HPI Pt was seen at 1520.   Per pt, c/o gradual onset and persistence of constant SOB for the past 1 month.  States she was eval by her Asthma MD last week for same, rx prednisone and increased her steroid inhaler to BID.  States she also had a chest x-ray completed and was "told my heart was enlarged."  States she has a f/u appt with her MD in 2 days but "can't wait until then" so she came to the ED today.  Denies CP/palpitations, no cough/wheezing, no fevers, no abd pain, no N/V/D, no back pain.      Past Medical History  Diagnosis Date  . Diabetes mellitus   . Hypertension   . PONV (postoperative nausea and vomiting)   . Asthma     Past Surgical History  Procedure Laterality Date  . Abdominal hysterectomy    . Breast reduction surgery    . Hammer toe surgery      bilateral feet  . Nose surgery      sinus surgery  . Orif ankle fracture  06/29/2011    Procedure: OPEN REDUCTION INTERNAL FIXATION (ORIF) ANKLE FRACTURE;  Surgeon: Kennieth Rad, MD;  Location: [facility];  Service: Orthopedics;  Laterality: Right;    History  Substance Use Topics  . Smoking status: Former Smoker    Quit date: 10/29/2006  . Smokeless tobacco: Never Used  . Alcohol Use: No      Review of Systems ROS: Statement: All systems negative except as marked or noted in the HPI; Constitutional: Negative for fever and chills. ; ; Eyes: Negative for eye pain, redness and discharge. ; ; ENMT: Negative for ear pain, hoarseness, nasal congestion, sinus pressure and sore throat. ; ; Cardiovascular: +SOB. Negative for chest pain, palpitations, diaphoresis, and peripheral edema. ; ; Respiratory: Negative for cough, wheezing and stridor. ; ; Gastrointestinal: Negative for nausea, vomiting, diarrhea, abdominal pain, blood in  stool, hematemesis, jaundice and rectal bleeding. . ; ; Genitourinary: Negative for dysuria, flank pain and hematuria. ; ; Musculoskeletal: Negative for back pain and neck pain. Negative for swelling and trauma.; ; Skin: Negative for pruritus, rash, abrasions, blisters, bruising and skin lesion.; ; Neuro: Negative for headache, lightheadedness and neck stiffness. Negative for weakness, altered level of consciousness , altered mental status, extremity weakness, paresthesias, involuntary movement, seizure and syncope.       Allergies  Review of patient's allergies indicates no known allergies.  Home Medications   Current Outpatient Rx  Name  Route  Sig  Dispense  Refill  . albuterol (PROVENTIL HFA;VENTOLIN HFA) 108 (90 BASE) MCG/ACT inhaler   Inhalation   Inhale 2 puffs into the lungs every 6 (six) hours as needed for wheezing.         Marland Kitchen amLODipine (NORVASC) 10 MG tablet   Oral   Take 10 mg by mouth every morning.          Marland Kitchen atorvastatin (LIPITOR) 40 MG tablet   Oral   Take 40 mg by mouth daily. Only takes on MWF         . cetirizine (ZYRTEC) 10 MG tablet   Oral   Take 10 mg by mouth every morning.         Marland Kitchen  diclofenac (VOLTAREN) 75 MG EC tablet   Oral   Take 75 mg by mouth 2 (two) times daily as needed.         Marland Kitchen escitalopram (LEXAPRO) 10 MG tablet   Oral   Take 10 mg by mouth every morning.         Marland Kitchen HYDROcodone-acetaminophen (NORCO) 10-325 MG per tablet   Oral   Take 1 tablet by mouth every 6 (six) hours as needed for pain.         Marland Kitchen lansoprazole (PREVACID) 30 MG capsule   Oral   Take 30 mg by mouth 2 (two) times daily after a meal.         . metFORMIN (GLUCOPHAGE) 1000 MG tablet   Oral   Take 1,000 mg by mouth 2 (two) times daily with a meal.         . mometasone-formoterol (DULERA) 100-5 MCG/ACT AERO   Inhalation   Inhale 2 puffs into the lungs 2 (two) times daily.         . Multiple Vitamin (MULITIVITAMIN WITH MINERALS) TABS   Oral   Take  1 tablet by mouth daily.         Marland Kitchen zolpidem (AMBIEN) 10 MG tablet   Oral   Take 5-10 mg by mouth at bedtime as needed for sleep.           BP 168/79  Pulse 97  Temp(Src) 98.7 F (37.1 C) (Oral)  Resp 16  SpO2 100%  Physical Exam 1525: Physical examination:  Nursing notes reviewed; Vital signs and O2 SAT reviewed;  Constitutional: Well developed, Well nourished, Well hydrated, In no acute distress; Head:  Normocephalic, atraumatic; Eyes: EOMI, PERRL, No scleral icterus; ENMT: Mouth and pharynx normal, Mucous membranes moist; Neck: Supple, Full range of motion, No lymphadenopathy; Cardiovascular: Regular rate and rhythm, No gallop; Respiratory: Breath sounds coarse & equal bilaterally, No wheezes.  Speaking full sentences with ease, Normal respiratory effort/excursion; Chest: Nontender, Movement normal; Abdomen: Soft, Nontender, Nondistended, Normal bowel sounds; Genitourinary: No CVA tenderness; Extremities: Pulses normal, No tenderness, +1 pedal edema bilat. No calf edema or asymmetry.; Neuro: AA&Ox3, Major CN grossly intact.  Speech clear. Climbs on and off stretcher easily by herself, gait steady. No gross focal motor or sensory deficits in extremities.; Skin: Color normal, Warm, Dry.   ED Course  Procedures       MDM  MDM Reviewed: previous chart, vitals and nursing note Reviewed previous: x-ray and ECG Interpretation: labs, ECG and x-ray    Date: 06/26/2012  Rate: 76  Rhythm: normal sinus rhythm  QRS Axis: normal  Intervals: normal  ST/T Wave abnormalities: normal  Conduction Disutrbances:none  Narrative Interpretation:   Old EKG Reviewed: unchanged; no significant changes from previous EKG dated 06/29/2011.   Results for orders placed during the hospital encounter of 06/26/12  PRO B NATRIURETIC PEPTIDE      Result Value Range   Pro B Natriuretic peptide (BNP) 140.0 (*) 0 - 125 pg/mL  TROPONIN I      Result Value Range   Troponin I <0.30  <0.30 ng/mL  CBC  WITH DIFFERENTIAL      Result Value Range   WBC 12.3 (*) 4.0 - 10.5 K/uL   RBC 4.34  3.87 - 5.11 MIL/uL   Hemoglobin 8.8 (*) 12.0 - 15.0 g/dL   HCT 40.9 (*) 81.1 - 91.4 %   MCV 69.4 (*) 78.0 - 100.0 fL   MCH 20.3 (*) 26.0 - 34.0 pg  MCHC 29.2 (*) 30.0 - 36.0 g/dL   RDW 40.9 (*) 81.1 - 91.4 %   Platelets 518 (*) 150 - 400 K/uL   Neutrophils Relative 83 (*) 43 - 77 %   Lymphocytes Relative 13  12 - 46 %   Monocytes Relative 4  3 - 12 %   Eosinophils Relative 0  0 - 5 %   Basophils Relative 0  0 - 1 %   Neutro Abs 10.2 (*) 1.7 - 7.7 K/uL   Lymphs Abs 1.6  0.7 - 4.0 K/uL   Monocytes Absolute 0.5  0.1 - 1.0 K/uL   Eosinophils Absolute 0.0  0.0 - 0.7 K/uL   Basophils Absolute 0.0  0.0 - 0.1 K/uL   RBC Morphology POLYCHROMASIA PRESENT     Smear Review LARGE PLATELETS PRESENT    BASIC METABOLIC PANEL      Result Value Range   Sodium 138  135 - 145 mEq/L   Potassium 3.9  3.5 - 5.1 mEq/L   Chloride 100  96 - 112 mEq/L   CO2 23  19 - 32 mEq/L   Glucose, Bld 252 (*) 70 - 99 mg/dL   BUN 10  6 - 23 mg/dL   Creatinine, Ser 7.82  0.50 - 1.10 mg/dL   Calcium 9.5  8.4 - 95.6 mg/dL   GFR calc non Af Amer >90  >90 mL/min   GFR calc Af Amer >90  >90 mL/min  D-DIMER, QUANTITATIVE      Result Value Range   D-Dimer, Quant <0.27  0.00 - 0.48 ug/mL-FEU   Dg Chest 2 View 06/26/2012  *RADIOLOGY REPORT*  Clinical Data: Shortness of breath, history asthma, hypertension, diabetes  CHEST - 2 VIEW  Comparison: 06/22/2012  Findings: Enlargement of cardiac silhouette with pulmonary vascular congestion. Tortuous aorta. Bibasilar atelectasis. No gross acute infiltrate or pulmonary edema. No pleural effusion or pneumothorax. Multilevel endplate spur formation thoracic spine.  IMPRESSION: Enlargement of cardiac silhouette with pulmonary vascular congestion. Bibasilar atelectasis. No acute abnormalities.   Original Report Authenticated By: Ulyses Southward, M.D.      2000:  Pt with known anemia and cardiomegaly on CXR  for the past several years (pt informed of this).  Pt has had 2 nebs while in the ED, states she "feels a little better" and wants to go home now.  Pt has ambulated with steady gait, easy resps, Sats 100% R/A.  Lungs CTA bilat.  Will continue to tx symptomatically for asthma exacerbation. Pt states she has a prednisone rx at home she can take, as well as an albuterol MDI to use.  Dx and testing d/w pt and family.  Questions answered.  Verb understanding, agreeable to d/c home with outpt f/u.         Laray Anger, DO 06/29/12 1523

## 2013-09-04 ENCOUNTER — Ambulatory Visit: Payer: 59 | Admitting: Interventional Cardiology

## 2013-11-07 ENCOUNTER — Ambulatory Visit: Payer: 59 | Admitting: Interventional Cardiology

## 2014-01-01 ENCOUNTER — Ambulatory Visit (INDEPENDENT_AMBULATORY_CARE_PROVIDER_SITE_OTHER): Payer: 59 | Admitting: Interventional Cardiology

## 2014-01-01 ENCOUNTER — Encounter: Payer: Self-pay | Admitting: Interventional Cardiology

## 2014-01-01 VITALS — BP 130/76 | HR 82 | Ht 71.0 in | Wt 302.0 lb

## 2014-01-01 DIAGNOSIS — I517 Cardiomegaly: Secondary | ICD-10-CM

## 2014-01-01 NOTE — Patient Instructions (Signed)
Your physician recommends that you continue on your current medications as directed. Please refer to the Current Medication list given to you today.  Stay on your cpap therapy  Your physician encouraged you to lose weight for better health.  Your physician discussed the importance of regular exercise and recommended that you start or continue a regular exercise program for good health.  We will give you a call with recommendations, once your cardiac records are received from [facility]

## 2014-01-01 NOTE — Progress Notes (Signed)
Patient ID: Alexandria Ruiz, female   DOB: 1955/11/08, 58 y.o.   MRN: [id number]   Date: 01/01/2014 ID: Alexandria Ruiz, DOB 1955-07-21, MRN [id number] PCP: Namon Cirri  Reason: Cardiomegaly  ASSESSMENT;  1. Cardiomegaly by chest x-ray and also mention of pulmonary congestion, 2014 2. Hypertension 3. Recently treated sleep apnea, obstructive 4. Obesity  PLAN:  1. Continue Azor for blood pressure control 2. Weight loss 3. Sleep apnea control 4. Review data from [facility] related to the diagnosis of cardiomegaly. This sounds as though an echo has been performed. If so, we need to obtain that information before further testing is done. 5. Salt restriction   SUBJECTIVE: Alexandria Ruiz is a 58 y.o. female who is doing relatively well and is here to be evaluated because of a history of cardiac enlargement. I have no specific data that documents by what modality cardiomegaly was diagnosed. I do find a chest x-ray report in the health link computer that suggests cardiac enlargement with pulmonary congestion done in 2014. She gives a history that sounds as though an echocardiogram is also been performed. We do not have that information.  She denies orthopnea or lower extremity swelling. Prior to treating sleep apnea, she did have lower extremity edema. Her medication regimen for hypertension his been recently adjusted. Her blood pressures been under much better control.  She denies history of heart disease, chest pain, palpitations, syncope, and kidney disease. There is no family history of heart disease. She has an identical twin who also has no history of heart ailment. There is a greater than 10 year history of hypertension. Sleep apnea was diagnosed 1-1/2 years ago and his been home therapy since that time.   Allergies  Allergen Reactions  . Aspirin     Stomach upset   . Azor [Amlodipine-Olmesartan]     Cough   . Diovan [Valsartan]     cough  . Exforge [Amlodipine  Besylate-Valsartan]     cough    Current Outpatient Prescriptions on File Prior to Visit  Medication Sig Dispense Refill  . albuterol (PROVENTIL HFA;VENTOLIN HFA) 108 (90 BASE) MCG/ACT inhaler Inhale 2 puffs into the lungs every 6 (six) hours as needed for wheezing.      Marland Kitchen aspirin 81 MG tablet Take 81 mg by mouth daily.      . cetirizine (ZYRTEC) 10 MG tablet Take 10 mg by mouth every morning.      . diclofenac (VOLTAREN) 75 MG EC tablet Take 75 mg by mouth 2 (two) times daily as needed.      Marland Kitchen escitalopram (LEXAPRO) 10 MG tablet Take 10 mg by mouth every morning.      . hydrOXYzine (ATARAX/VISTARIL) 25 MG tablet Take 25 mg by mouth 3 (three) times daily as needed.      . mometasone-formoterol (DULERA) 100-5 MCG/ACT AERO Inhale 2 puffs into the lungs 2 (two) times daily.      . Multiple Vitamin (MULITIVITAMIN WITH MINERALS) TABS Take 1 tablet by mouth daily.      . pravastatin (PRAVACHOL) 40 MG tablet Take 40 mg by mouth daily.      Marland Kitchen zolpidem (AMBIEN) 10 MG tablet Take 5-10 mg by mouth at bedtime as needed for sleep.       No current facility-administered medications on file prior to visit.    Past Medical History  Diagnosis Date  . Diabetes mellitus   . Hypertension   . PONV (postoperative nausea and vomiting)   . Asthma   .  Hyperlipidemia   . Allergic rhinitis   . Peripheral neuropathy   . Anemia   . Anxiety   . GERD (gastroesophageal reflux disease)   . Sleep apnea   . Abdominal pain   . Bronchitis   . Obesity     Past Surgical History  Procedure Laterality Date  . Abdominal hysterectomy    . Breast reduction surgery    . Hammer toe surgery      bilateral feet  . Nose surgery      sinus surgery  . Orif ankle fracture  06/29/2011    Procedure: OPEN REDUCTION INTERNAL FIXATION (ORIF) ANKLE FRACTURE;  Surgeon: Sharmon Revere, MD;  Location: [facility];  Service: Orthopedics;  Laterality: Right;    History   Social History  . Marital Status: Married    Spouse Name:  N/A    Number of Children: N/A  . Years of Education: N/A   Occupational History  . Not on file.   Social History Main Topics  . Smoking status: Former Smoker    Quit date: 10/29/2006  . Smokeless tobacco: Never Used  . Alcohol Use: No  . Drug Use: No  . Sexual Activity: Not on file   Other Topics Concern  . Not on file   Social History Narrative  . No narrative on file    No family history on file.  ROS: No peripheral edema. Denies syncope and transient neurological complaints. No chest discomfort. No history of kidney disease.. She is on CPAP for sleep apnea. Other systems negative for complaints.  OBJECTIVE: BP 130/76  Pulse 82  Ht 5\' 11"  (1.803 m)  Wt 302 lb (136.986 kg)  BMI 42.14 kg/m2,  General: No acute distress, morbid obesity HEENT: normal no jaundice or pallor Neck: JVD flat. Carotids absent Chest: Clear Cardiac: Murmur: None. Gallop: S4 is audible. Rhythm: Normal. Other: Normal Abdomen: Bruit: Absent. Pulsation: Normal Extremities: Edema: Absent. Pulses: 2+ and symmetric upper and lower extremities Neuro: Normal Psych: Normal  ECG: Sinus rhythm, prominent voltage, left atrial abnormality, left ventricular hypertrophy per

## 2014-01-02 ENCOUNTER — Telehealth: Payer: Self-pay | Admitting: Interventional Cardiology

## 2014-01-02 NOTE — Telephone Encounter (Signed)
ROI Faxed to [place] @ [facility]  [phone]

## 2014-01-02 NOTE — Telephone Encounter (Signed)
Records rec From [facility] @ [facility] Gave to [place]

## 2014-03-28 ENCOUNTER — Encounter (HOSPITAL_COMMUNITY): Payer: Self-pay | Admitting: Orthopedic Surgery

## 2014-10-08 ENCOUNTER — Other Ambulatory Visit: Payer: Self-pay | Admitting: Family Medicine

## 2014-10-08 DIAGNOSIS — R1310 Dysphagia, unspecified: Secondary | ICD-10-CM

## 2014-10-16 ENCOUNTER — Encounter (INDEPENDENT_AMBULATORY_CARE_PROVIDER_SITE_OTHER): Payer: Self-pay

## 2014-10-16 ENCOUNTER — Ambulatory Visit
Admission: RE | Admit: 2014-10-16 | Discharge: 2014-10-16 | Disposition: A | Discharge status: Home / Self Care | Payer: 59 | Source: Ambulatory Visit | Attending: Family Medicine | Admitting: Family Medicine

## 2014-10-16 DIAGNOSIS — R1310 Dysphagia, unspecified: Secondary | ICD-10-CM

## 2015-05-27 ENCOUNTER — Ambulatory Visit: Payer: Self-pay | Admitting: Orthopedic Surgery

## 2015-05-27 NOTE — H&P (Signed)
Alexandria Ruiz DOB: 1955-11-04 Married / Language: English / Race: Black or African American Female Date of Admission:  06/23/2015 CC:  Left and Right Knee Pain History of Present Illness The patient is a 60 year old female who comes in for a preoperative History and Physical. The patient is scheduled for a left total knee arthroplasty to be performed by Dr. Dione Plover. Aluisio, MD at [facility] on 06-23-2015. She would also liek to get a cortisone injection into the right knee at the same time. The patient is a 60 year old female who presented for follow up of their knee. The patient is being followed for their bilateral knee pain and osteoarthritis. Symptoms reported include: pain. The patient has undergone cortisone injections in the past adn have provided on temporary relief. She has been using Pennsaid also. She reports that the both knees are bothering her. She does not have issues with instability or locking. Swelling at times. She has pain mainly with weightbearing. No significant problems at rest. Left knee concurrently bothers her slightly more than the right, both are very painful. The injections are not as beneficially as they used to be. The knees are starting to take over what she can and cannot do. She liked to be more active in an attempt to lose weight gain, but cannot do so because of her knees. She is now at a point where she would like to get the knees replaced. Both have hurt at different times but states the left knee is the worst of the two. She is ready to proceed with surgery. They have been treated conservatively in the past for the above stated problem and despite conservative measures, they continue to have progressive pain and severe functional limitations and dysfunction. They have failed non-operative management including home exercise, medications, and injections. It is felt that they would benefit from undergoing total joint replacement. Risks and benefits  of the procedure have been discussed with the patient and they elect to proceed with surgery. There are no active contraindications to surgery such as ongoing infection or rapidly progressive neurological disease.  Problem List/Past Medical Spinal stenosis, lumbar (M48.06)  Complete Rotator Cuff Rupture, non-traumatic (727.61)  Chronic pain of both knees (M25.561, M25.562)  Lumbar DDD (M51.36)  Spondylolisthesis of lumbar region (M43.16)  Impingement syndrome of right shoulder (M75.41)  Primary osteoarthritis of right knee (M17.11)  Carpal tunnel syndrome of right wrist (G56.01)  Lumbar spine pain (M54.5)  Tenosynovitis of left shoulder (M65.9)  Hypercholesterolemia  High blood pressure  Rheumatoid Arthritis  Sleep Apnea  Anemia  Asthma  Gastroesophageal Reflux Disease  Diabetes Mellitus, Type II   Allergies HYDROcodone Bitartrate *CHEMICALS*  Nausea, Itching. OxyCODONE HCl *ANALGESICS - OPIOID*  Itching, Nausea.  Family History  Other medical problems  twin sister Rheumatoid Arthritis  First Degree Relatives. mother sister Hypertension  mother Heart disease in female family member before age 24  Diabetes Mellitus  grandmother fathers side Congestive Heart Failure  mother  Social History Previously in rehab  no Pain Contract  no Marital status  married Current work status  working part time Alcohol use  Drinks wine. Tobacco use  Former smoker. Number of flights of stairs before winded  4-5 2-3 Illicit drug use  no Drug/Alcohol Rehab (Previously)  no Exercise  Exercises weekly; does other Most recent primary occupation  enrichment specialist Drug/Alcohol Rehab (Currently)  no Living situation  live with spouse Children  0 Tobacco / smoke exposure  no  Medication  History Mobic (15MG  Tablet, 1 (one) Oral po qd with food, Taken starting 07/02/2014) Active. Dulera (200-5MCG/ACT Aerosol, Inhalation) Active. Zolpidem Tartrate  (10MG  Tablet, Oral) Active. Lipitor (10MG  Tablet, Oral) Active. Azor (5-40MG  Tablet, Oral) Active. Trulicity (0.75MG /0.5ML Soln Pen-inj, Subcutaneous) Active. Aspirin (81MG  Tablet Chewable, Oral) Active.   Past Surgical History Carpal Tunnel Repair  bilateral Arthroscopy of Knee  left Foot Surgery  bilateral Mammoplasty; Reduction  bilateral Hysterectomy  partial (non-cancerous) Ankle Surgery  right Other Surgery  broke right ankle June 29 2011. surgery same day five screws. Dr. Marily Memos. Straighten Nasal Septum  Sinus Surgery  Tonsillectomy  Review of Systems  Review of Systems  Constitutional: Negative.   HENT: Negative.   Respiratory: Negative.   Cardiovascular: Negative.   Gastrointestinal: Negative.   Genitourinary: Negative.   Musculoskeletal: Positive for joint pain.   Physical Exam General Mental Status -Alert, cooperative and good historian. General Appearance-pleasant, Not in acute distress. Orientation-Oriented X3. Build & Nutrition-Well nourished and Well developed.  Head and Neck Head-normocephalic, atraumatic . Neck Global Assessment - supple, no bruit auscultated on the right, no bruit auscultated on the left.  Eye Pupil - Bilateral-Regular and Round. Motion - Bilateral-EOMI.  Chest and Lung Exam Auscultation Breath sounds - clear at anterior chest wall and clear at posterior chest wall. Adventitious sounds - No Adventitious sounds.  Cardiovascular Auscultation Rhythm - Regular rate and rhythm. Heart Sounds - S1 WNL and S2 WNL. Murmurs & Other Heart Sounds - Auscultation of the heart reveals - No Murmurs.  Abdomen Palpation/Percussion Tenderness - Abdomen is non-tender to palpation. Rigidity (guarding) - Abdomen is soft. Auscultation Auscultation of the abdomen reveals - Bowel sounds normal.  Female Genitourinary Note: Not done, not pertinent to present illness   Musculoskeletal Note: On exam, she is  alert and oriented in no apparent distress. Her hips show normal motion, no discomfort. Both knees show no effusion. The right knee range about 5 to 125 marked crepitus. Her range of motion, tenderness, medial greater than lateral with no instability. Left knee, no effusion. Range 5 to 125, markedly crepitus on range of motion, some tenderness medial greater than lateral with no instability noted.  RADIOGRAPHS Her radiographs today AP of both knees and lateral show bone-on-bone arthritis in the medial and patellofemoral compartments of both knees.   Assessment & Plan  Primary osteoarthritis of left knee (M17.12) Primary osteoarthritis of right knee (M17.11)  Note:Surgical Plans: Left Total Knee Replacement and a Right Knee Cortisone Injection  Disposition: Home with Husband  PCP: Dr. Alyson Ingles - patient has been seen preoperative and felt to be stable for surgery  IV TXA  Anesthesia Issues: Nausea and vomiting with anesthesia  Signed electronically by Ok Edwards, III PA-C

## 2015-05-27 NOTE — Progress Notes (Signed)
Preoperative surgical orders have been place into the Epic hospital system for Alexandria Ruiz on 05/27/2015, 5:40 PM  by Mickel Crow for surgery on [id number].  Preop Total Knee orders including Experal, IV Tylenol, and IV Decadron as long as there are no contraindications to the above medications. [name], PA-C

## 2015-06-04 ENCOUNTER — Ambulatory Visit: Payer: Self-pay | Admitting: Orthopedic Surgery

## 2015-06-06 ENCOUNTER — Ambulatory Visit: Payer: Self-pay | Admitting: Orthopedic Surgery

## 2015-06-06 NOTE — H&P (Signed)
Alexandria Ruiz DOB: 05-27-1955 Married / Language: English / Race: Black or African American Female Date of Admission: 06/23/2015 CC: Left and Right Knee Pain History of Present Illness The patient is a 60 year old female who comes in for a preoperative History and Physical. The patient is scheduled for a left total knee arthroplasty to be performed by Dr. Dione Plover. Aluisio, MD at [facility] on 06-23-2015. She would also liek to get a cortisone injection into the right knee at the same time. The patient is a 60 year old female who presented for follow up of their knee. The patient is being followed for their bilateral knee pain and osteoarthritis. Symptoms reported include: pain. The patient has undergone cortisone injections in the past adn have provided on temporary relief. She has been using Pennsaid also. She reports that the both knees are bothering her. She does not have issues with instability or locking. Swelling at times. She has pain mainly with weightbearing. No significant problems at rest. Left knee concurrently bothers her slightly more than the right, both are very painful. The injections are not as beneficially as they used to be. The knees are starting to take over what she can and cannot do. She liked to be more active in an attempt to lose weight gain, but cannot do so because of her knees. She is now at a point where she would like to get the knees replaced. Both have hurt at different times but states the left knee is the worst of the two. She is ready to proceed with surgery. They have been treated conservatively in the past for the above stated problem and despite conservative measures, they continue to have progressive pain and severe functional limitations and dysfunction. They have failed non-operative management including home exercise, medications, and injections. It is felt that they would benefit from undergoing total joint replacement. Risks and benefits  of the procedure have been discussed with the patient and they elect to proceed with surgery. There are no active contraindications to surgery such as ongoing infection or rapidly progressive neurological disease.  Problem List/Past Medical Spinal stenosis, lumbar (M48.06)  Complete Rotator Cuff Rupture, non-traumatic (727.61)  Chronic pain of both knees (M25.561, M25.562)  Lumbar DDD (M51.36)  Spondylolisthesis of lumbar region (M43.16)  Impingement syndrome of right shoulder (M75.41)  Primary osteoarthritis of right knee (M17.11)  Carpal tunnel syndrome of right wrist (G56.01)  Lumbar spine pain (M54.5)  Tenosynovitis of left shoulder (M65.9)  Hypercholesterolemia  High blood pressure  Rheumatoid Arthritis  Sleep Apnea  Anemia  Asthma  Gastroesophageal Reflux Disease  Diabetes Mellitus, Type II   Allergies HYDROcodone Bitartrate *CHEMICALS*  Nausea, Itching. OxyCODONE HCl *ANALGESICS - OPIOID*  Itching, Nausea.  Family History  Other medical problems  twin sister Rheumatoid Arthritis  First Degree Relatives. mother sister Hypertension  mother Heart disease in female family member before age 56  Diabetes Mellitus  grandmother fathers side Congestive Heart Failure  mother  Social History Previously in rehab  no Pain Contract  no Marital status  married Current work status  working part time Alcohol use  Drinks wine. Tobacco use  Former smoker. Number of flights of stairs before winded  4-5 2-3 Illicit drug use  no Drug/Alcohol Rehab (Previously)  no Exercise  Exercises weekly; does other Most recent primary occupation  enrichment specialist Drug/Alcohol Rehab (Currently)  no Living situation  live with spouse Children  0 Tobacco / smoke exposure  no  Medication History Mobic (  15MG  Tablet, 1 (one) Oral po qd with food, Taken starting 07/02/2014) Active. Dulera (200-5MCG/ACT Aerosol, Inhalation) Active. Zolpidem Tartrate  (10MG  Tablet, Oral) Active. Lipitor (10MG  Tablet, Oral) Active. Azor (5-40MG  Tablet, Oral) Active. Trulicity (0.75MG /0.5ML Soln Pen-inj, Subcutaneous) Active. Aspirin (81MG  Tablet Chewable, Oral) Active.   Past Surgical History Carpal Tunnel Repair  bilateral Arthroscopy of Knee  left Foot Surgery  bilateral Mammoplasty; Reduction  bilateral Hysterectomy  partial (non-cancerous) Ankle Surgery  right Other Surgery  broke right ankle June 29 2011. surgery same day five screws. Dr. Marily Memos. Straighten Nasal Septum  Sinus Surgery  Tonsillectomy  Review of Systems  Review of Systems  Constitutional: Negative.  HENT: Negative.  Respiratory: Negative.  Cardiovascular: Negative.  Gastrointestinal: Negative.  Genitourinary: Negative.  Musculoskeletal: Positive for joint pain.   Physical Exam General Mental Status -Alert, cooperative and good historian. General Appearance-pleasant, Not in acute distress. Orientation-Oriented X3. Build & Nutrition-Well nourished and Well developed.  Head and Neck Head-normocephalic, atraumatic . Neck Global Assessment - supple, no bruit auscultated on the right, no bruit auscultated on the left.  Eye Pupil - Bilateral-Regular and Round. Motion - Bilateral-EOMI.  Chest and Lung Exam Auscultation Breath sounds - clear at anterior chest wall and clear at posterior chest wall. Adventitious sounds - No Adventitious sounds.  Cardiovascular Auscultation Rhythm - Regular rate and rhythm. Heart Sounds - S1 WNL and S2 WNL. Murmurs & Other Heart Sounds - Auscultation of the heart reveals - No Murmurs.  Abdomen Palpation/Percussion Tenderness - Abdomen is non-tender to palpation. Rigidity (guarding) - Abdomen is soft. Auscultation Auscultation of the abdomen reveals - Bowel sounds normal.  Female Genitourinary Note: Not done, not pertinent to present illness   Musculoskeletal Note: On exam, she is  alert and oriented in no apparent distress. Her hips show normal motion, no discomfort. Both knees show no effusion. The right knee range about 5 to 125 marked crepitus. Her range of motion, tenderness, medial greater than lateral with no instability. Left knee, no effusion. Range 5 to 125, markedly crepitus on range of motion, some tenderness medial greater than lateral with no instability noted.  RADIOGRAPHS Her radiographs today AP of both knees and lateral show bone-on-bone arthritis in the medial and patellofemoral compartments of both knees.   Assessment & Plan  Primary osteoarthritis of left knee (M17.12) Primary osteoarthritis of right knee (M17.11)  Note:Surgical Plans: Left Total Knee Replacement and a Right Knee Cortisone Injection  Disposition: Home with Husband  PCP: Dr. Alyson Ingles - patient has been seen preoperative and felt to be stable for surgery  IV TXA  Anesthesia Issues: Nausea and vomiting with anesthesia  Signed electronically by Ok Edwards, III PA-C

## 2015-06-16 ENCOUNTER — Other Ambulatory Visit: Payer: Self-pay

## 2015-06-16 ENCOUNTER — Encounter (HOSPITAL_COMMUNITY)
Admission: RE | Admit: 2015-06-16 | Discharge: 2015-06-16 | Disposition: A | Discharge status: Home / Self Care | Payer: 59 | Source: Ambulatory Visit | Attending: Orthopedic Surgery | Admitting: Orthopedic Surgery

## 2015-06-16 ENCOUNTER — Encounter (HOSPITAL_COMMUNITY): Payer: Self-pay

## 2015-06-16 DIAGNOSIS — Z01812 Encounter for preprocedural laboratory examination: Secondary | ICD-10-CM | POA: Insufficient documentation

## 2015-06-16 DIAGNOSIS — M1710 Unilateral primary osteoarthritis, unspecified knee: Secondary | ICD-10-CM | POA: Insufficient documentation

## 2015-06-16 DIAGNOSIS — Z01810 Encounter for preprocedural cardiovascular examination: Secondary | ICD-10-CM | POA: Diagnosis present

## 2015-06-16 HISTORY — DX: Unspecified osteoarthritis, unspecified site: M19.90

## 2015-06-16 HISTORY — DX: Reserved for inherently not codable concepts without codable children: IMO0001

## 2015-06-16 LAB — SURGICAL PCR SCREEN
MRSA, PCR: NEGATIVE
Staphylococcus aureus: NEGATIVE

## 2015-06-16 LAB — CBC
HCT: 32.6 % — ABNORMAL LOW (ref 36.0–46.0)
Hemoglobin: 10.1 g/dL — ABNORMAL LOW (ref 12.0–15.0)
MCH: 25.9 pg — ABNORMAL LOW (ref 26.0–34.0)
MCHC: 31 g/dL (ref 30.0–36.0)
MCV: 83.6 fL (ref 78.0–100.0)
PLATELETS: 356 10*3/uL (ref 150–400)
RBC: 3.9 MIL/uL (ref 3.87–5.11)
RDW: 15.7 % — AB (ref 11.5–15.5)
WBC: 8.7 10*3/uL (ref 4.0–10.5)

## 2015-06-16 LAB — COMPREHENSIVE METABOLIC PANEL
ALBUMIN: 3.9 g/dL (ref 3.5–5.0)
ALT: 16 U/L (ref 14–54)
ANION GAP: 10 (ref 5–15)
AST: 18 U/L (ref 15–41)
Alkaline Phosphatase: 75 U/L (ref 38–126)
BILIRUBIN TOTAL: 0.8 mg/dL (ref 0.3–1.2)
BUN: 17 mg/dL (ref 6–20)
CALCIUM: 9.6 mg/dL (ref 8.9–10.3)
CO2: 23 mmol/L (ref 22–32)
CREATININE: 0.97 mg/dL (ref 0.44–1.00)
Chloride: 108 mmol/L (ref 101–111)
Glucose, Bld: 90 mg/dL (ref 65–99)
Potassium: 4.2 mmol/L (ref 3.5–5.1)
Sodium: 141 mmol/L (ref 135–145)
Total Protein: 7.4 g/dL (ref 6.5–8.1)

## 2015-06-16 LAB — URINALYSIS, ROUTINE W REFLEX MICROSCOPIC
GLUCOSE, UA: NEGATIVE mg/dL
Hgb urine dipstick: NEGATIVE
Ketones, ur: NEGATIVE mg/dL
Nitrite: NEGATIVE
PH: 5.5 (ref 5.0–8.0)
Protein, ur: NEGATIVE mg/dL
Specific Gravity, Urine: 1.043 — ABNORMAL HIGH (ref 1.005–1.030)

## 2015-06-16 LAB — PROTIME-INR
INR: 1.02 (ref 0.00–1.49)
PROTHROMBIN TIME: 13.6 s (ref 11.6–15.2)

## 2015-06-16 LAB — ABO/RH: ABO/RH(D): O POS

## 2015-06-16 LAB — URINE MICROSCOPIC-ADD ON

## 2015-06-16 LAB — APTT: APTT: 32 s (ref 24–37)

## 2015-06-16 NOTE — Progress Notes (Signed)
01-01-14 - EKG - EPIC 01-01-14 - LOV - Dr. Tamala Julian (cardio) - EPIC

## 2015-06-16 NOTE — Patient Instructions (Signed)
Alexandria Ruiz  06/16/2015   Your procedure is scheduled on: June 23, 2015  Report to [facility]  Entrance take [facility]  elevators to 3rd floor to  [place] at 6:30 AM.  Call this number if you have problems the morning of surgery [phone]   Remember: ONLY 1 PERSON MAY GO WITH YOU TO SHORT STAY TO GET  READY MORNING OF [place].  Do not eat food or drink liquids :After Midnight.              Bring mask and tubing from c-pap machine     Take these medicines the morning of surgery with A SIP OF WATER: Albuterol if needed and bring, Zyrtec, Lexapro, Dulera inhaler, Rabeprazole DO NOT TAKE ANY DIABETIC MEDICATIONS DAY OF YOUR SURGERY                               You may not have any metal on your body including hair pins and              piercings  Do not wear jewelry, make-up, lotions, powders or perfumes, deodorant             Do not wear nail polish.  Do not shave  48 hours prior to surgery.              Men may shave face and neck.   Do not bring valuables to the hospital. [place].  Contacts, dentures or bridgework may not be worn into surgery.  Leave suitcase in the car. After surgery it may be brought to your room.     Marland Kitchen    Special Instructions: coughing and deep breathing exercises, leg exercises              Please read over the following fact sheets you were given: _____________________________________________________________________             [facility] - Preparing for Surgery Before surgery, you can play an important role.  Because skin is not sterile, your skin needs to be as free of germs as possible.  You can reduce the number of germs on your skin by washing with CHG (chlorahexidine gluconate) soap before surgery.  CHG is an antiseptic cleaner which kills germs and bonds with the skin to continue killing germs even after washing. Please DO NOT use if you have an  allergy to CHG or antibacterial soaps.  If your skin becomes reddened/irritated stop using the CHG and inform your nurse when you arrive at Short Stay. Do not shave (including legs and underarms) for at least 48 hours prior to the first CHG shower.  You may shave your face/neck. Please follow these instructions carefully:  1.  Shower with CHG Soap the night before surgery and the  morning of Surgery.  2.  If you choose to wash your hair, wash your hair first as usual with your  normal  shampoo.  3.  After you shampoo, rinse your hair and body thoroughly to remove the  shampoo.                           4.  Use CHG as you  would any other liquid soap.  You can apply chg directly  to the skin and wash                       Gently with a scrungie or clean washcloth.  5.  Apply the CHG Soap to your body ONLY FROM THE NECK DOWN.   Do not use on face/ open                           Wound or open sores. Avoid contact with eyes, ears mouth and genitals (private parts).                       Wash face,  Genitals (private parts) with your normal soap.             6.  Wash thoroughly, paying special attention to the area where your surgery  will be performed.  7.  Thoroughly rinse your body with warm water from the neck down.  8.  DO NOT shower/wash with your normal soap after using and rinsing off  the CHG Soap.                9.  Pat yourself dry with a clean towel.            10.  Wear clean pajamas.            11.  Place clean sheets on your bed the night of your first shower and do not  sleep with pets. Day of Surgery : Do not apply any lotions/deodorants the morning of surgery.  Please wear clean clothes to the hospital/surgery center.  FAILURE TO FOLLOW THESE INSTRUCTIONS MAY RESULT IN THE CANCELLATION OF YOUR SURGERY PATIENT SIGNATURE_________________________________  NURSE  SIGNATURE__________________________________  ________________________________________________________________________   Alexandria Ruiz  An incentive spirometer is a tool that can help keep your lungs clear and active. This tool measures how well you are filling your lungs with each breath. Taking long deep breaths may help reverse or decrease the chance of developing breathing (pulmonary) problems (especially infection) following:  A long period of time when you are unable to move or be active. BEFORE THE PROCEDURE   If the spirometer includes an indicator to show your best effort, your nurse or respiratory therapist will set it to a desired goal.  If possible, sit up straight or lean slightly forward. Try not to slouch.  Hold the incentive spirometer in an upright position. INSTRUCTIONS FOR USE   Sit on the edge of your bed if possible, or sit up as far as you can in bed or on a chair.  Hold the incentive spirometer in an upright position.  Breathe out normally.  Place the mouthpiece in your mouth and seal your lips tightly around it.  Breathe in slowly and as deeply as possible, raising the piston or the ball toward the top of the column.  Hold your breath for 3-5 seconds or for as long as possible. Allow the piston or ball to fall to the bottom of the column.  Remove the mouthpiece from your mouth and breathe out normally.  Rest for a few seconds and repeat Steps 1 through 7 at least 10 times every 1-2 hours when you are awake. Take your time and take a few normal breaths between deep breaths.  The spirometer may include an indicator to show your best effort. Use the indicator  as a goal to work toward during each repetition.  After each set of 10 deep breaths, practice coughing to be sure your lungs are clear. If you have an incision (the cut made at the time of surgery), support your incision when coughing by placing a pillow or rolled up towels firmly against it. Once  you are able to get out of bed, walk around indoors and cough well. You may stop using the incentive spirometer when instructed by your caregiver.  RISKS AND COMPLICATIONS  Take your time so you do not get dizzy or light-headed.  If you are in pain, you may need to take or ask for pain medication before doing incentive spirometry. It is harder to take a deep breath if you are having pain. AFTER USE  Rest and breathe slowly and easily.  It can be helpful to keep track of a log of your progress. Your caregiver can provide you with a simple table to help with this. If you are using the spirometer at home, follow these instructions: [place] IF:   You are having difficultly using the spirometer.  You have trouble using the spirometer as often as instructed.  Your pain medication is not giving enough relief while using the spirometer.  You develop fever of 100.5 F (38.1 C) or higher. SEEK IMMEDIATE MEDICAL CARE IF:   You cough up bloody sputum that had not been present before.  You develop fever of 102 F (38.9 C) or greater.  You develop worsening pain at or near the incision site. MAKE SURE YOU:   Understand these instructions.  Will watch your condition.  Will get help right away if you are not doing well or get worse. Document Released: 07/12/2006 Document Revised: 05/24/2011 Document Reviewed: 09/12/2006 Exit[employer] Patient Information 2014 Exit[employer], [place].   ________________________________________________________________________  WHAT IS A BLOOD TRANSFUSION? Blood Transfusion Information  A transfusion is the replacement of blood or some of its parts. Blood is made up of multiple cells which provide different functions.  Red blood cells carry oxygen and are used for blood loss replacement.  White blood cells fight against infection.  Platelets control bleeding.  Plasma helps clot blood.  Other blood products are available for specialized needs, such as  hemophilia or other clotting disorders. BEFORE THE TRANSFUSION  Who gives blood for transfusions?   Healthy volunteers who are fully evaluated to make sure their blood is safe. This is blood bank blood. Transfusion therapy is the safest it has ever been in the practice of medicine. Before blood is taken from a donor, a complete history is taken to make sure that person has no history of diseases nor engages in risky social behavior (examples are intravenous drug use or sexual activity with multiple partners). The donor's travel history is screened to minimize risk of transmitting infections, such as malaria. The donated blood is tested for signs of infectious diseases, such as HIV and hepatitis. The blood is then tested to be sure it is compatible with you in order to minimize the chance of a transfusion reaction. If you or a relative donates blood, this is often done in anticipation of surgery and is not appropriate for emergency situations. It takes many days to process the donated blood. RISKS AND COMPLICATIONS Although transfusion therapy is very safe and saves many lives, the main dangers of transfusion include:   Getting an infectious disease.  Developing a transfusion reaction. This is an allergic reaction to something in the blood you were  given. Every precaution is taken to prevent this. The decision to have a blood transfusion has been considered carefully by your caregiver before blood is given. Blood is not given unless the benefits outweigh the risks. AFTER THE TRANSFUSION  Right after receiving a blood transfusion, you will usually feel much better and more energetic. This is especially true if your red blood cells have gotten low (anemic). The transfusion raises the level of the red blood cells which carry oxygen, and this usually causes an energy increase.  The nurse administering the transfusion will monitor you carefully for complications. HOME CARE INSTRUCTIONS  No special  instructions are needed after a transfusion. You may find your energy is better. Speak with your caregiver about any limitations on activity for underlying diseases you may have. SEEK MEDICAL CARE IF:   Your condition is not improving after your transfusion.  You develop redness or irritation at the intravenous (IV) site. SEEK IMMEDIATE MEDICAL CARE IF:  Any of the following symptoms occur over the next 12 hours:  Shaking chills.  You have a temperature by mouth above 102 F (38.9 C), not controlled by medicine.  Chest, back, or muscle pain.  People around you feel you are not acting correctly or are confused.  Shortness of breath or difficulty breathing.  Dizziness and fainting.  You get a rash or develop hives.  You have a decrease in urine output.  Your urine turns a dark color or changes to pink, red, or brown. Any of the following symptoms occur over the next 10 days:  You have a temperature by mouth above 102 F (38.9 C), not controlled by medicine.  Shortness of breath.  Weakness after normal activity.  The white part of the eye turns yellow (jaundice).  You have a decrease in the amount of urine or are urinating less often.  Your urine turns a dark color or changes to pink, red, or brown. Document Released: 02/27/2000 Document Revised: 05/24/2011 Document Reviewed: 10/16/2007 [employer] Patient Information 2014 [place], [place].  _______________________________________________________________________

## 2015-06-16 NOTE — Progress Notes (Signed)
06-16-15 - Pt. Stated at preop visit on 06-16-15 that she was not allergic to Amlodine and previous thought.  Coughing was caused by GERD.

## 2015-06-16 NOTE — Progress Notes (Signed)
06-16-15 - UA/Microscopic results from preop visit on 06-16-15 faxed to Dr. Wynelle Link via [facility]

## 2015-06-17 LAB — HEMOGLOBIN A1C
Hgb A1c MFr Bld: 6.8 % — ABNORMAL HIGH (ref 4.8–5.6)
Mean Plasma Glucose: 148 mg/dL

## 2015-06-22 MED ORDER — DEXTROSE 5 % IV SOLN
3.0000 g | INTRAVENOUS | Status: AC
  Administered 2015-06-23: 3 g via INTRAVENOUS
  Filled 2015-06-22: qty 3000

## 2015-06-23 ENCOUNTER — Encounter (HOSPITAL_COMMUNITY): Payer: Self-pay | Admitting: *Deleted

## 2015-06-23 ENCOUNTER — Encounter (HOSPITAL_COMMUNITY)
Admission: RE | Disposition: A | Discharge status: Home Health | Payer: Self-pay | Source: Ambulatory Visit | Attending: Orthopedic Surgery

## 2015-06-23 ENCOUNTER — Inpatient Hospital Stay (HOSPITAL_COMMUNITY): Payer: 59 | Admitting: Anesthesiology

## 2015-06-23 ENCOUNTER — Inpatient Hospital Stay (HOSPITAL_COMMUNITY)
Admission: RE | Admit: 2015-06-23 | Discharge: 2015-06-25 | DRG: 470 | Disposition: A | Discharge status: Home Health | Payer: 59 | Source: Ambulatory Visit | Attending: Orthopedic Surgery | Admitting: Orthopedic Surgery

## 2015-06-23 DIAGNOSIS — E119 Type 2 diabetes mellitus without complications: Secondary | ICD-10-CM | POA: Diagnosis present

## 2015-06-23 DIAGNOSIS — G4730 Sleep apnea, unspecified: Secondary | ICD-10-CM | POA: Diagnosis present

## 2015-06-23 DIAGNOSIS — M1712 Unilateral primary osteoarthritis, left knee: Secondary | ICD-10-CM

## 2015-06-23 DIAGNOSIS — Z01812 Encounter for preprocedural laboratory examination: Secondary | ICD-10-CM

## 2015-06-23 DIAGNOSIS — M170 Bilateral primary osteoarthritis of knee: Principal | ICD-10-CM | POA: Diagnosis present

## 2015-06-23 DIAGNOSIS — E7800 Pure hypercholesterolemia, unspecified: Secondary | ICD-10-CM | POA: Diagnosis present

## 2015-06-23 DIAGNOSIS — Z87891 Personal history of nicotine dependence: Secondary | ICD-10-CM

## 2015-06-23 DIAGNOSIS — Z6841 Body Mass Index (BMI) 40.0 and over, adult: Secondary | ICD-10-CM | POA: Diagnosis not present

## 2015-06-23 DIAGNOSIS — I10 Essential (primary) hypertension: Secondary | ICD-10-CM | POA: Diagnosis present

## 2015-06-23 DIAGNOSIS — M1710 Unilateral primary osteoarthritis, unspecified knee: Secondary | ICD-10-CM | POA: Diagnosis present

## 2015-06-23 DIAGNOSIS — M4806 Spinal stenosis, lumbar region: Secondary | ICD-10-CM | POA: Diagnosis present

## 2015-06-23 DIAGNOSIS — K219 Gastro-esophageal reflux disease without esophagitis: Secondary | ICD-10-CM | POA: Diagnosis present

## 2015-06-23 DIAGNOSIS — M179 Osteoarthritis of knee, unspecified: Secondary | ICD-10-CM | POA: Diagnosis present

## 2015-06-23 DIAGNOSIS — M25562 Pain in left knee: Secondary | ICD-10-CM | POA: Diagnosis present

## 2015-06-23 HISTORY — PX: TOTAL KNEE ARTHROPLASTY: SHX125

## 2015-06-23 HISTORY — PX: STERIOD INJECTION: SHX5046

## 2015-06-23 LAB — TYPE AND SCREEN
ABO/RH(D): O POS
Antibody Screen: NEGATIVE

## 2015-06-23 LAB — GLUCOSE, CAPILLARY
GLUCOSE-CAPILLARY: 123 mg/dL — AB (ref 65–99)
GLUCOSE-CAPILLARY: 222 mg/dL — AB (ref 65–99)

## 2015-06-23 SURGERY — ARTHROPLASTY, KNEE, TOTAL
Anesthesia: Spinal | Site: Knee | Laterality: Right

## 2015-06-23 MED ORDER — KETOROLAC TROMETHAMINE 15 MG/ML IJ SOLN
7.5000 mg | Freq: Four times a day (QID) | INTRAMUSCULAR | Status: DC | PRN
  Administered 2015-06-23: 7.5 mg via INTRAVENOUS
  Filled 2015-06-23 (×2): qty 1

## 2015-06-23 MED ORDER — HYDROCORTISONE NA SUCCINATE PF 100 MG IJ SOLR
INTRAMUSCULAR | Status: AC
Start: 2015-06-23 — End: 2015-06-23
  Filled 2015-06-23: qty 2

## 2015-06-23 MED ORDER — HYDROMORPHONE HCL 1 MG/ML IJ SOLN
0.5000 mg | INTRAMUSCULAR | Status: DC | PRN
  Administered 2015-06-23: 0.5 mg via INTRAVENOUS
  Administered 2015-06-23: 1 mg via INTRAVENOUS
  Administered 2015-06-23: 0.5 mg via INTRAVENOUS
  Administered 2015-06-23 – 2015-06-24 (×4): 1 mg via INTRAVENOUS
  Administered 2015-06-24: 0.5 mg via INTRAVENOUS
  Filled 2015-06-23 (×8): qty 1

## 2015-06-23 MED ORDER — PROPOFOL 500 MG/50ML IV EMUL
INTRAVENOUS | Status: DC | PRN
  Administered 2015-06-23: 50 ug/kg/min via INTRAVENOUS

## 2015-06-23 MED ORDER — OXYCODONE HCL 5 MG PO TABS
5.0000 mg | ORAL_TABLET | ORAL | Status: DC | PRN
Start: 2015-06-23 — End: 2015-06-25
  Administered 2015-06-23 – 2015-06-25 (×13): 10 mg via ORAL
  Filled 2015-06-23 (×13): qty 2

## 2015-06-23 MED ORDER — DEXTROSE 5 % IV SOLN
10.0000 mg | INTRAVENOUS | Status: DC | PRN
  Administered 2015-06-23: 30 ug/min via INTRAVENOUS

## 2015-06-23 MED ORDER — PROPOFOL 10 MG/ML IV BOLUS
INTRAVENOUS | Status: AC
  Filled 2015-06-23: qty 40

## 2015-06-23 MED ORDER — SODIUM CHLORIDE 0.9 % IJ SOLN
INTRAMUSCULAR | Status: DC | PRN
  Administered 2015-06-23: 30 mL

## 2015-06-23 MED ORDER — LORATADINE 10 MG PO TABS
10.0000 mg | ORAL_TABLET | Freq: Every day | ORAL | Status: DC
  Administered 2015-06-24 – 2015-06-25 (×2): 10 mg via ORAL
  Filled 2015-06-23 (×2): qty 1

## 2015-06-23 MED ORDER — AMLODIPINE BESYLATE 5 MG PO TABS
5.0000 mg | ORAL_TABLET | Freq: Once | ORAL | Status: AC
  Administered 2015-06-23: 5 mg via ORAL
  Filled 2015-06-23 (×2): qty 1

## 2015-06-23 MED ORDER — SODIUM CHLORIDE 0.9 % IJ SOLN
INTRAMUSCULAR | Status: AC
  Filled 2015-06-23: qty 50

## 2015-06-23 MED ORDER — METHYLPREDNISOLONE ACETATE 40 MG/ML IJ SUSP
INTRAMUSCULAR | Status: AC
  Filled 2015-06-23: qty 2

## 2015-06-23 MED ORDER — HYDROXYZINE HCL 25 MG PO TABS
25.0000 mg | ORAL_TABLET | Freq: Three times a day (TID) | ORAL | Status: DC | PRN
  Administered 2015-06-24 – 2015-06-25 (×2): 25 mg via ORAL
  Filled 2015-06-23 (×5): qty 1

## 2015-06-23 MED ORDER — ACETAMINOPHEN 500 MG PO TABS
1000.0000 mg | ORAL_TABLET | Freq: Four times a day (QID) | ORAL | Status: AC
  Administered 2015-06-23 – 2015-06-24 (×4): 1000 mg via ORAL
  Filled 2015-06-23 (×5): qty 2

## 2015-06-23 MED ORDER — TRANEXAMIC ACID 1000 MG/10ML IV SOLN
1000.0000 mg | INTRAVENOUS | Status: AC
  Administered 2015-06-23: 1000 mg via INTRAVENOUS
  Filled 2015-06-23: qty 10

## 2015-06-23 MED ORDER — INSULIN ASPART 100 UNIT/ML ~~LOC~~ SOLN
0.0000 [IU] | Freq: Three times a day (TID) | SUBCUTANEOUS | Status: DC
  Administered 2015-06-24: 5 [IU] via SUBCUTANEOUS
  Administered 2015-06-24: 8 [IU] via SUBCUTANEOUS
  Administered 2015-06-24: 5 [IU] via SUBCUTANEOUS
  Administered 2015-06-25: 3 [IU] via SUBCUTANEOUS

## 2015-06-23 MED ORDER — MEPERIDINE HCL 50 MG/ML IJ SOLN
6.2500 mg | INTRAMUSCULAR | Status: DC | PRN
Start: 2015-06-23 — End: 2015-06-23

## 2015-06-23 MED ORDER — METOCLOPRAMIDE HCL 5 MG/ML IJ SOLN: 10.0000 mg | Freq: Once | INTRAMUSCULAR | Status: DC | PRN

## 2015-06-23 MED ORDER — FENTANYL CITRATE (PF) 100 MCG/2ML IJ SOLN
INTRAMUSCULAR | Status: AC
  Filled 2015-06-23: qty 2

## 2015-06-23 MED ORDER — CHLORHEXIDINE GLUCONATE 4 % EX LIQD: 60.0000 mL | Freq: Once | CUTANEOUS | Status: DC

## 2015-06-23 MED ORDER — MIDAZOLAM HCL 5 MG/5ML IJ SOLN
INTRAMUSCULAR | Status: DC | PRN
  Administered 2015-06-23: 2 mg via INTRAVENOUS

## 2015-06-23 MED ORDER — IRBESARTAN 300 MG PO TABS
300.0000 mg | ORAL_TABLET | Freq: Every day | ORAL | Status: DC
  Administered 2015-06-24 – 2015-06-25 (×2): 300 mg via ORAL
  Filled 2015-06-23 (×2): qty 1

## 2015-06-23 MED ORDER — SODIUM CHLORIDE 0.9 % IV SOLN
INTRAVENOUS | Status: DC
  Administered 2015-06-23: 22:00:00 via INTRAVENOUS
  Administered 2015-06-23: 75 mL/h via INTRAVENOUS

## 2015-06-23 MED ORDER — MORPHINE SULFATE (PF) 2 MG/ML IV SOLN
1.0000 mg | INTRAVENOUS | Status: DC | PRN
  Administered 2015-06-23: 1 mg via INTRAVENOUS
  Filled 2015-06-23: qty 1

## 2015-06-23 MED ORDER — SODIUM CHLORIDE 0.9 % IV SOLN: INTRAVENOUS | Status: DC

## 2015-06-23 MED ORDER — CEFAZOLIN SODIUM-DEXTROSE 2-4 GM/100ML-% IV SOLN
2.0000 g | Freq: Four times a day (QID) | INTRAVENOUS | Status: AC
  Administered 2015-06-23 (×2): 2 g via INTRAVENOUS
  Filled 2015-06-23 (×2): qty 100

## 2015-06-23 MED ORDER — ACETAMINOPHEN 10 MG/ML IV SOLN
INTRAVENOUS | Status: AC
  Filled 2015-06-23: qty 100

## 2015-06-23 MED ORDER — DEXAMETHASONE SODIUM PHOSPHATE 10 MG/ML IJ SOLN
10.0000 mg | Freq: Once | INTRAMUSCULAR | Status: AC
  Administered 2015-06-23: 10 mg via INTRAVENOUS

## 2015-06-23 MED ORDER — PHENYLEPHRINE HCL 10 MG/ML IJ SOLN
INTRAMUSCULAR | Status: DC | PRN
  Administered 2015-06-23 (×5): 120 ug via INTRAVENOUS
  Administered 2015-06-23: 80 ug via INTRAVENOUS

## 2015-06-23 MED ORDER — AMLODIPINE-OLMESARTAN 5-40 MG PO TABS: 1.0000 | ORAL_TABLET | Freq: Every day | ORAL | Status: DC

## 2015-06-23 MED ORDER — MIDAZOLAM HCL 2 MG/2ML IJ SOLN
INTRAMUSCULAR | Status: AC
  Filled 2015-06-23: qty 2

## 2015-06-23 MED ORDER — FLEET ENEMA 7-19 GM/118ML RE ENEM: 1.0000 | ENEMA | Freq: Once | RECTAL | Status: DC | PRN

## 2015-06-23 MED ORDER — PHENOL 1.4 % MT LIQD: 1.0000 | OROMUCOSAL | Status: DC | PRN

## 2015-06-23 MED ORDER — BUPIVACAINE HCL 0.25 % IJ SOLN
INTRAMUSCULAR | Status: DC | PRN
  Administered 2015-06-23: 20 mL

## 2015-06-23 MED ORDER — LACTATED RINGERS IV SOLN: INTRAVENOUS | Status: DC

## 2015-06-23 MED ORDER — FENTANYL CITRATE (PF) 100 MCG/2ML IJ SOLN: 25.0000 ug | INTRAMUSCULAR | Status: DC | PRN

## 2015-06-23 MED ORDER — METOCLOPRAMIDE HCL 5 MG/ML IJ SOLN: 5.0000 mg | Freq: Three times a day (TID) | INTRAMUSCULAR | Status: DC | PRN

## 2015-06-23 MED ORDER — ALBUTEROL SULFATE (2.5 MG/3ML) 0.083% IN NEBU: 3.0000 mL | INHALATION_SOLUTION | Freq: Four times a day (QID) | RESPIRATORY_TRACT | Status: DC | PRN

## 2015-06-23 MED ORDER — METHOCARBAMOL 500 MG PO TABS
500.0000 mg | ORAL_TABLET | Freq: Four times a day (QID) | ORAL | Status: DC | PRN
  Administered 2015-06-23 – 2015-06-24 (×5): 500 mg via ORAL
  Filled 2015-06-23 (×6): qty 1

## 2015-06-23 MED ORDER — ACETAMINOPHEN 10 MG/ML IV SOLN
1000.0000 mg | Freq: Once | INTRAVENOUS | Status: AC
  Administered 2015-06-23: 1000 mg via INTRAVENOUS
  Filled 2015-06-23: qty 100

## 2015-06-23 MED ORDER — DOCUSATE SODIUM 100 MG PO CAPS
100.0000 mg | ORAL_CAPSULE | Freq: Two times a day (BID) | ORAL | Status: DC
  Administered 2015-06-23 – 2015-06-25 (×2): 100 mg via ORAL

## 2015-06-23 MED ORDER — ESCITALOPRAM OXALATE 10 MG PO TABS
10.0000 mg | ORAL_TABLET | Freq: Every morning | ORAL | Status: DC
  Administered 2015-06-24 – 2015-06-25 (×2): 10 mg via ORAL
  Filled 2015-06-23 (×2): qty 1

## 2015-06-23 MED ORDER — RIVAROXABAN 10 MG PO TABS
10.0000 mg | ORAL_TABLET | Freq: Every day | ORAL | Status: DC
  Administered 2015-06-24 – 2015-06-25 (×2): 10 mg via ORAL
  Filled 2015-06-23 (×3): qty 1

## 2015-06-23 MED ORDER — BUPIVACAINE LIPOSOME 1.3 % IJ SUSP
INTRAMUSCULAR | Status: DC | PRN
  Administered 2015-06-23: 20 mL

## 2015-06-23 MED ORDER — METHYLPREDNISOLONE ACETATE 40 MG/ML IJ SUSP
INTRAMUSCULAR | Status: DC | PRN
Start: 2015-06-23 — End: 2015-06-23
  Administered 2015-06-23: 80 mg

## 2015-06-23 MED ORDER — ZOLPIDEM TARTRATE 5 MG PO TABS: 5.0000 mg | ORAL_TABLET | Freq: Every evening | ORAL | Status: DC | PRN

## 2015-06-23 MED ORDER — MOMETASONE FURO-FORMOTEROL FUM 100-5 MCG/ACT IN AERO
2.0000 | INHALATION_SPRAY | Freq: Two times a day (BID) | RESPIRATORY_TRACT | Status: DC
Start: 2015-06-23 — End: 2015-06-25
  Filled 2015-06-23: qty 8.8

## 2015-06-23 MED ORDER — DIPHENHYDRAMINE HCL 12.5 MG/5ML PO ELIX: 12.5000 mg | ORAL_SOLUTION | ORAL | Status: DC | PRN

## 2015-06-23 MED ORDER — PANTOPRAZOLE SODIUM 40 MG PO TBEC
40.0000 mg | DELAYED_RELEASE_TABLET | Freq: Every day | ORAL | Status: DC
  Administered 2015-06-24 – 2015-06-25 (×2): 40 mg via ORAL
  Filled 2015-06-23 (×2): qty 1

## 2015-06-23 MED ORDER — SODIUM CHLORIDE 0.9 % IR SOLN
Status: DC | PRN
  Administered 2015-06-23: 1000 mL

## 2015-06-23 MED ORDER — AMLODIPINE BESYLATE 5 MG PO TABS
5.0000 mg | ORAL_TABLET | Freq: Every day | ORAL | Status: DC
  Administered 2015-06-24 – 2015-06-25 (×2): 5 mg via ORAL
  Filled 2015-06-23 (×2): qty 1

## 2015-06-23 MED ORDER — 0.9 % SODIUM CHLORIDE (POUR BTL) OPTIME
TOPICAL | Status: DC | PRN
  Administered 2015-06-23: 1000 mL

## 2015-06-23 MED ORDER — FENTANYL CITRATE (PF) 100 MCG/2ML IJ SOLN
INTRAMUSCULAR | Status: DC | PRN
  Administered 2015-06-23: 100 ug via INTRAVENOUS

## 2015-06-23 MED ORDER — BISACODYL 10 MG RE SUPP: 10.0000 mg | Freq: Every day | RECTAL | Status: DC | PRN

## 2015-06-23 MED ORDER — STERILE WATER FOR IRRIGATION IR SOLN
Status: DC | PRN
  Administered 2015-06-23: 2000 mL

## 2015-06-23 MED ORDER — ATORVASTATIN CALCIUM 40 MG PO TABS
40.0000 mg | ORAL_TABLET | Freq: Every day | ORAL | Status: DC
  Administered 2015-06-24 – 2015-06-25 (×2): 40 mg via ORAL
  Filled 2015-06-23 (×2): qty 1

## 2015-06-23 MED ORDER — BUPIVACAINE HCL (PF) 0.25 % IJ SOLN
INTRAMUSCULAR | Status: AC
  Filled 2015-06-23: qty 30

## 2015-06-23 MED ORDER — LACTATED RINGERS IV SOLN
INTRAVENOUS | Status: DC | PRN
  Administered 2015-06-23 (×2): via INTRAVENOUS

## 2015-06-23 MED ORDER — BUPIVACAINE LIPOSOME 1.3 % IJ SUSP
20.0000 mL | Freq: Once | INTRAMUSCULAR | Status: DC
  Filled 2015-06-23: qty 20

## 2015-06-23 MED ORDER — MENTHOL 3 MG MT LOZG: 1.0000 | LOZENGE | OROMUCOSAL | Status: DC | PRN

## 2015-06-23 MED ORDER — BUPIVACAINE HCL (PF) 0.75 % IJ SOLN
INTRAMUSCULAR | Status: DC | PRN
  Administered 2015-06-23: 1.8 mL via INTRATHECAL

## 2015-06-23 MED ORDER — ONDANSETRON HCL 4 MG/2ML IJ SOLN
4.0000 mg | Freq: Four times a day (QID) | INTRAMUSCULAR | Status: DC | PRN
Start: 2015-06-23 — End: 2015-06-25

## 2015-06-23 MED ORDER — PROPOFOL 10 MG/ML IV BOLUS
INTRAVENOUS | Status: DC | PRN
  Administered 2015-06-23: 40 mg via INTRAVENOUS

## 2015-06-23 MED ORDER — ACETAMINOPHEN 325 MG PO TABS
650.0000 mg | ORAL_TABLET | Freq: Four times a day (QID) | ORAL | Status: DC | PRN
  Administered 2015-06-24: 650 mg via ORAL
  Filled 2015-06-23: qty 2

## 2015-06-23 MED ORDER — POLYETHYLENE GLYCOL 3350 17 G PO PACK: 17.0000 g | PACK | Freq: Every day | ORAL | Status: DC | PRN

## 2015-06-23 MED ORDER — ACETAMINOPHEN 650 MG RE SUPP: 650.0000 mg | Freq: Four times a day (QID) | RECTAL | Status: DC | PRN

## 2015-06-23 MED ORDER — PHENYLEPHRINE 40 MCG/ML (10ML) SYRINGE FOR IV PUSH (FOR BLOOD PRESSURE SUPPORT)
PREFILLED_SYRINGE | INTRAVENOUS | Status: AC
  Filled 2015-06-23: qty 20

## 2015-06-23 MED ORDER — METOCLOPRAMIDE HCL 10 MG PO TABS: 5.0000 mg | ORAL_TABLET | Freq: Three times a day (TID) | ORAL | Status: DC | PRN

## 2015-06-23 MED ORDER — TRANEXAMIC ACID 1000 MG/10ML IV SOLN
1000.0000 mg | Freq: Once | INTRAVENOUS | Status: AC
  Administered 2015-06-23: 1000 mg via INTRAVENOUS
  Filled 2015-06-23: qty 10

## 2015-06-23 MED ORDER — METHOCARBAMOL 1000 MG/10ML IJ SOLN
500.0000 mg | Freq: Four times a day (QID) | INTRAVENOUS | Status: DC | PRN
  Administered 2015-06-23: 500 mg via INTRAVENOUS
  Filled 2015-06-23 (×3): qty 5

## 2015-06-23 MED ORDER — ONDANSETRON HCL 4 MG PO TABS: 4.0000 mg | ORAL_TABLET | Freq: Four times a day (QID) | ORAL | Status: DC | PRN

## 2015-06-23 MED ORDER — DEXAMETHASONE SODIUM PHOSPHATE 10 MG/ML IJ SOLN
10.0000 mg | Freq: Once | INTRAMUSCULAR | Status: AC
  Administered 2015-06-24: 10 mg via INTRAVENOUS
  Filled 2015-06-23: qty 1

## 2015-06-23 SURGICAL SUPPLY — 49 items
BANDAGE ACE 6X5 VEL STRL LF (GAUZE/BANDAGES/DRESSINGS) ×3 IMPLANT
BLADE SAG 18X100X1.27 (BLADE) ×3 IMPLANT
BLADE SAW SGTL 11.0X1.19X90.0M (BLADE) ×3 IMPLANT
BOWL SMART MIX CTS (DISPOSABLE) ×3 IMPLANT
CAPT KNEE TOTAL 3 ATTUNE ×3 IMPLANT
CEMENT HV SMART SET (Cement) ×6 IMPLANT
CLOTH BEACON ORANGE TIMEOUT ST (SAFETY) ×3 IMPLANT
CUFF TOURN SGL QUICK 44 (TOURNIQUET CUFF) ×3 IMPLANT
DECANTER SPIKE VIAL GLASS SM (MISCELLANEOUS) ×3 IMPLANT
DRAPE U-SHAPE 47X51 STRL (DRAPES) ×3 IMPLANT
DRSG ADAPTIC 3X8 NADH LF (GAUZE/BANDAGES/DRESSINGS) ×3 IMPLANT
DRSG PAD ABDOMINAL 8X10 ST (GAUZE/BANDAGES/DRESSINGS) ×3 IMPLANT
DURAPREP 26ML APPLICATOR (WOUND CARE) ×3 IMPLANT
ELECT REM PT RETURN 9FT ADLT (ELECTROSURGICAL) ×3
ELECTRODE REM PT RTRN 9FT ADLT (ELECTROSURGICAL) ×2 IMPLANT
EVACUATOR 1/8 PVC DRAIN (DRAIN) ×3 IMPLANT
GAUZE SPONGE 4X4 12PLY STRL (GAUZE/BANDAGES/DRESSINGS) ×3 IMPLANT
GLOVE BIO SURGEON STRL SZ8 (GLOVE) ×6 IMPLANT
GLOVE BIOGEL PI IND STRL 6.5 (GLOVE) ×2 IMPLANT
GLOVE BIOGEL PI IND STRL 7.5 (GLOVE) ×6 IMPLANT
GLOVE BIOGEL PI IND STRL 8 (GLOVE) ×2 IMPLANT
GLOVE BIOGEL PI INDICATOR 6.5 (GLOVE) ×1
GLOVE BIOGEL PI INDICATOR 7.5 (GLOVE) ×3
GLOVE BIOGEL PI INDICATOR 8 (GLOVE) ×1
GLOVE SURG SS PI 6.5 STRL IVOR (GLOVE) ×3 IMPLANT
GLOVE SURG SS PI 7.5 STRL IVOR (GLOVE) ×6 IMPLANT
GOWN STRL REUS W/ TWL LRG LVL3 (GOWN DISPOSABLE) ×2 IMPLANT
GOWN STRL REUS W/TWL LRG LVL3 (GOWN DISPOSABLE) ×4 IMPLANT
GOWN STRL REUS W/TWL XL LVL3 (GOWN DISPOSABLE) ×6 IMPLANT
HANDPIECE INTERPULSE COAX TIP (DISPOSABLE) ×1
HOVERMATT SINGLE USE (MISCELLANEOUS) ×3 IMPLANT
IMMOBILIZER KNEE 20 (SOFTGOODS) ×3
IMMOBILIZER KNEE 20 THIGH 36 (SOFTGOODS) ×2 IMPLANT
MANIFOLD NEPTUNE II (INSTRUMENTS) ×3 IMPLANT
NEEDLE HYPO 22GX1.5 SAFETY (NEEDLE) ×3 IMPLANT
PACK TOTAL KNEE CUSTOM (KITS) ×3 IMPLANT
PAD ABD 8X10 STRL (GAUZE/BANDAGES/DRESSINGS) ×3 IMPLANT
PADDING CAST COTTON 6X4 STRL (CAST SUPPLIES) ×3 IMPLANT
POSITIONER SURGICAL ARM (MISCELLANEOUS) ×3 IMPLANT
SET HNDPC FAN SPRY TIP SCT (DISPOSABLE) ×2 IMPLANT
STRIP CLOSURE SKIN 1/2X4 (GAUZE/BANDAGES/DRESSINGS) ×3 IMPLANT
SUT MNCRL AB 4-0 PS2 18 (SUTURE) ×3 IMPLANT
SUT VIC AB 2-0 CT1 27 (SUTURE) ×4
SUT VIC AB 2-0 CT1 TAPERPNT 27 (SUTURE) ×8 IMPLANT
SUT VLOC 180 0 24IN GS25 (SUTURE) ×3 IMPLANT
SYR 50ML LL SCALE MARK (SYRINGE) ×3 IMPLANT
TRAY FOLEY W/METER SILVER 14FR (SET/KITS/TRAYS/PACK) ×3 IMPLANT
WRAP KNEE MAXI GEL POST OP (GAUZE/BANDAGES/DRESSINGS) ×3 IMPLANT
YANKAUER SUCT BULB TIP 10FT TU (MISCELLANEOUS) ×3 IMPLANT

## 2015-06-23 NOTE — Progress Notes (Signed)
Patient refuses CPAP at this time. She states that her husband will be bring her home CPAP from home. RT will continue to monitor.

## 2015-06-23 NOTE — Progress Notes (Signed)
Report to Amy RN

## 2015-06-23 NOTE — Anesthesia Postprocedure Evaluation (Signed)
Anesthesia Post Note  Patient: Alexandria Ruiz  Procedure(s) Performed: Procedure(s) (LRB): TOTAL LEFT KNEE ARTHROPLASTY (Left) RIGHT KNEE CORTISONE INJECTION (Right)  Patient location during evaluation: PACU Anesthesia Type: Spinal Level of consciousness: oriented and awake and alert Pain management: pain level controlled Vital Signs Assessment: post-procedure vital signs reviewed and stable Respiratory status: spontaneous breathing, respiratory function stable and patient connected to nasal cannula oxygen Cardiovascular status: blood pressure returned to baseline and stable Postop Assessment: no headache and no backache Anesthetic complications: no    Last Vitals:  Filed Vitals:   06/23/15 1300 06/23/15 1400  BP: 136/75 145/85  Pulse: 100 100  Temp: 36.7 C 36.6 C  Resp: 16 16    Last Pain:  Filed Vitals:   06/23/15 1428  PainSc: 4                  Montez Hageman

## 2015-06-23 NOTE — Anesthesia Procedure Notes (Signed)
Spinal Patient location during procedure: OR Staffing Anesthesiologist: Montez Hageman Performed by: anesthesiologist  Preanesthetic Checklist Completed: patient identified, site marked, surgical consent, pre-op evaluation, timeout performed, IV checked, risks and benefits discussed and monitors and equipment checked Spinal Block Patient position: sitting Prep: Betadine Patient monitoring: heart rate, continuous pulse ox and blood pressure Approach: midline Location: L3-4 Injection technique: single-shot Needle Needle type: Spinocan  Needle gauge: 22 G Needle length: 12.7 cm Additional Notes Expiration date of kit checked and confirmed. Patient tolerated procedure well, without complications.

## 2015-06-23 NOTE — Anesthesia Preprocedure Evaluation (Addendum)
Anesthesia Evaluation  Patient identified by MRN, date of birth, ID band Patient awake    Reviewed: Allergy & Precautions, H&P , NPO status , Patient's Chart, lab work & pertinent test results  History of Anesthesia Complications (+) PONV  Airway Mallampati: II  TM Distance: >3 FB Neck ROM: full    Dental  (+) Caps, Dental Advisory Given,    Pulmonary sleep apnea and Continuous Positive Airway Pressure Ventilation , former smoker,    Pulmonary exam normal breath sounds clear to auscultation       Cardiovascular Exercise Tolerance: Good hypertension, Pt. on medications Normal cardiovascular exam Rhythm:regular Rate:Normal     Neuro/Psych negative neurological ROS  negative psych ROS   GI/Hepatic Neg liver ROS, GERD  ,  Endo/Other  diabetes, Well Controlled, Type 2, Oral Hypoglycemic AgentsMorbid obesity  Renal/GU negative Renal ROS  negative genitourinary   Musculoskeletal   Abdominal   Peds  Hematology negative hematology ROS (+)   Anesthesia Other Findings   Reproductive/Obstetrics negative OB ROS                             Anesthesia Physical  Anesthesia Plan  ASA: III  Anesthesia Plan: Spinal   Post-op Pain Management:    Induction: Intravenous  Airway Management Planned: Simple Face Mask  Additional Equipment:   Intra-op Plan:   Post-operative Plan:   Informed Consent: I have reviewed the patients History and Physical, chart, labs and discussed the procedure including the risks, benefits and alternatives for the proposed anesthesia with the patient or authorized representative who has indicated his/her understanding and acceptance.   Dental Advisory Given  Plan Discussed with: CRNA and Surgeon  Anesthesia Plan Comments:        Anesthesia Quick Evaluation

## 2015-06-23 NOTE — Op Note (Signed)
Pre-operative diagnosis- Osteoarthritis  Bilateral knee(s)  Post-operative diagnosis- Osteoarthritis Bilateral knee(s)  Procedure-  Left  Total Knee Arthroplasty   Right knee cortisone injection  Surgeon- Dione Plover. Tamekia Rotter, MD  Assistant- Ardeen Jourdain, PA-C   Anesthesia-  Spinal  EBL-* No blood loss amount entered *   Drains Hemovac  Tourniquet time-  Total Tourniquet Time Documented: Thigh (Left) - 43 minutes Total: Thigh (Left) - 43 minutes     Complications- None  Condition-PACU - hemodynamically stable.   Brief Clinical Note  Alexandria Ruiz is a 60 y.o. year old female with end stage OA of her left knee with progressively worsening pain and dysfunction. She has constant pain, with activity and at rest and significant functional deficits with difficulties even with ADLs. She has had extensive non-op management including analgesics, injections of cortisone and viscosupplements, and home exercise program, but remains in significant pain with significant dysfunction. Radiographs show bone on bone arthritis all 3 compartments. She presents now for left Total Knee Arthroplasty. She also has right knee OA and requests cortisone injection.  Procedure in detail---   The patient is brought into the operating room and positioned supine on the operating table. After successful administration of  Spinal,   a tourniquet is placed high on the  Left thigh(s) and the lower extremity is prepped and draped in the usual sterile fashion. Time out is performed by the operating team and then the  Left lower extremity is wrapped in Esmarch, knee flexed and the tourniquet inflated to 300 mmHg.       A midline incision is made with a ten blade through the subcutaneous tissue to the level of the extensor mechanism. A fresh blade is used to make a medial parapatellar arthrotomy. Soft tissue over the proximal medial tibia is subperiosteally elevated to the joint line with a knife and into the  semimembranosus bursa with a Cobb elevator. Soft tissue over the proximal lateral tibia is elevated with attention being paid to avoiding the patellar tendon on the tibial tubercle. The patella is everted, knee flexed 90 degrees and the ACL and PCL are removed. Findings are bone on bone medial and patellofemoral with large global osteophytes.        The drill is used to create a starting hole in the distal femur and the canal is thoroughly irrigated with sterile saline to remove the fatty contents. The 5 degree Left  valgus alignment guide is placed into the femoral canal and the distal femoral cutting block is pinned to remove 10 mm off the distal femur. Resection is made with an oscillating saw.      The tibia is subluxed forward and the menisci are removed. The extramedullary alignment guide is placed referencing proximally at the medial aspect of the tibial tubercle and distally along the second metatarsal axis and tibial crest. The block is pinned to remove 38mm off the more deficient medial  side. Resection is made with an oscillating saw. Size 4is the most appropriate size for the tibia and the proximal tibia is prepared with the modular drill and keel punch for that size.      The femoral sizing guide is placed and size 5 is most appropriate. Rotation is marked off the epicondylar axis and confirmed by creating a rectangular flexion gap at 90 degrees. The size 5 cutting block is pinned in this rotation and the anterior, posterior and chamfer cuts are made with the oscillating saw. The intercondylar block is then placed and  that cut is made.      Trial size 4 tibial component, trial size 5 posterior stabilized femur and a 7  mm posterior stabilized rotating platform insert trial is placed. Full extension is achieved with excellent varus/valgus and anterior/posterior balance throughout full range of motion. The patella is everted and thickness measured to be 22  mm. Free hand resection is taken to 12 mm, a  35 template is placed, lug holes are drilled, trial patella is placed, and it tracks normally. Osteophytes are removed off the posterior femur with the trial in place. All trials are removed and the cut bone surfaces prepared with pulsatile lavage. Cement is mixed and once ready for implantation, the size 4 tibial implant, size  5 posterior stabilized femoral component, and the size 35 patella are cemented in place and the patella is held with the clamp. The trial insert is placed and the knee held in full extension. The Exparel (20 ml mixed with 30 ml saline) and .25% Bupivicaine, are injected into the extensor mechanism, posterior capsule, medial and lateral gutters and subcutaneous tissues.  All extruded cement is removed and once the cement is hard the permanent 7 mm posterior stabilized rotating platform insert is placed into the tibial tray.      The wound is copiously irrigated with saline solution and the extensor mechanism closed over a hemovac drain with #1 V-loc suture. The tourniquet is released for a total tourniquet time of 43  minutes. Flexion against gravity is 140 degrees and the patella tracks normally. Subcutaneous tissue is closed with 2.0 vicryl and subcuticular with running 4.0 Monocryl. The incision is cleaned and dried and steri-strips and a bulky sterile dressing are applied. The limb is placed into a knee immobilizer.      The right knee is prepped with betadine and injected with 80 mg (2 ml) Depomedrol without problems.The patient is then awakened and transported to recovery in stable condition.      Please note that a surgical assistant was a medical necessity for this procedure in order to perform it in a safe and expeditious manner. Surgical assistant was necessary to retract the ligaments and vital neurovascular structures to prevent injury to them and also necessary for proper positioning of the limb to allow for anatomic placement of the prosthesis.   Dione Plover Illias Pantano,  MD    06/23/2015, 10:45 AM

## 2015-06-23 NOTE — H&P (View-Only) (Signed)
Alexandria Ruiz DOB: 03-02-1956 Married / Language: English / Race: Black or African American Female Date of Admission: 06/23/2015 CC: Left and Right Knee Pain History of Present Illness The patient is a 60 year old female who comes in for a preoperative History and Physical. The patient is scheduled for a left total knee arthroplasty to be performed by Dr. Dione Plover. Aluisio, MD at [facility] on 06-23-2015. She would also liek to get a cortisone injection into the right knee at the same time. The patient is a 60 year old female who presented for follow up of their knee. The patient is being followed for their bilateral knee pain and osteoarthritis. Symptoms reported include: pain. The patient has undergone cortisone injections in the past adn have provided on temporary relief. She has been using Pennsaid also. She reports that the both knees are bothering her. She does not have issues with instability or locking. Swelling at times. She has pain mainly with weightbearing. No significant problems at rest. Left knee concurrently bothers her slightly more than the right, both are very painful. The injections are not as beneficially as they used to be. The knees are starting to take over what she can and cannot do. She liked to be more active in an attempt to lose weight gain, but cannot do so because of her knees. She is now at a point where she would like to get the knees replaced. Both have hurt at different times but states the left knee is the worst of the two. She is ready to proceed with surgery. They have been treated conservatively in the past for the above stated problem and despite conservative measures, they continue to have progressive pain and severe functional limitations and dysfunction. They have failed non-operative management including home exercise, medications, and injections. It is felt that they would benefit from undergoing total joint replacement. Risks and benefits  of the procedure have been discussed with the patient and they elect to proceed with surgery. There are no active contraindications to surgery such as ongoing infection or rapidly progressive neurological disease.  Problem List/Past Medical Spinal stenosis, lumbar (M48.06)  Complete Rotator Cuff Rupture, non-traumatic (727.61)  Chronic pain of both knees (M25.561, M25.562)  Lumbar DDD (M51.36)  Spondylolisthesis of lumbar region (M43.16)  Impingement syndrome of right shoulder (M75.41)  Primary osteoarthritis of right knee (M17.11)  Carpal tunnel syndrome of right wrist (G56.01)  Lumbar spine pain (M54.5)  Tenosynovitis of left shoulder (M65.9)  Hypercholesterolemia  High blood pressure  Rheumatoid Arthritis  Sleep Apnea  Anemia  Asthma  Gastroesophageal Reflux Disease  Diabetes Mellitus, Type II   Allergies HYDROcodone Bitartrate *CHEMICALS*  Nausea, Itching. OxyCODONE HCl *ANALGESICS - OPIOID*  Itching, Nausea.  Family History  Other medical problems  twin sister Rheumatoid Arthritis  First Degree Relatives. mother sister Hypertension  mother Heart disease in female family member before age 32  Diabetes Mellitus  grandmother fathers side Congestive Heart Failure  mother  Social History Previously in rehab  no Pain Contract  no Marital status  married Current work status  working part time Alcohol use  Drinks wine. Tobacco use  Former smoker. Number of flights of stairs before winded  4-5 2-3 Illicit drug use  no Drug/Alcohol Rehab (Previously)  no Exercise  Exercises weekly; does other Most recent primary occupation  enrichment specialist Drug/Alcohol Rehab (Currently)  no Living situation  live with spouse Children  0 Tobacco / smoke exposure  no  Medication History Mobic (  15MG  Tablet, 1 (one) Oral po qd with food, Taken starting 07/02/2014) Active. Dulera (200-5MCG/ACT Aerosol, Inhalation) Active. Zolpidem Tartrate  (10MG  Tablet, Oral) Active. Lipitor (10MG  Tablet, Oral) Active. Azor (5-40MG  Tablet, Oral) Active. Trulicity (0.75MG /0.5ML Soln Pen-inj, Subcutaneous) Active. Aspirin (81MG  Tablet Chewable, Oral) Active.   Past Surgical History Carpal Tunnel Repair  bilateral Arthroscopy of Knee  left Foot Surgery  bilateral Mammoplasty; Reduction  bilateral Hysterectomy  partial (non-cancerous) Ankle Surgery  right Other Surgery  broke right ankle June 29 2011. surgery same day five screws. Dr. Marily Memos. Straighten Nasal Septum  Sinus Surgery  Tonsillectomy  Review of Systems  Review of Systems  Constitutional: Negative.  HENT: Negative.  Respiratory: Negative.  Cardiovascular: Negative.  Gastrointestinal: Negative.  Genitourinary: Negative.  Musculoskeletal: Positive for joint pain.   Physical Exam General Mental Status -Alert, cooperative and good historian. General Appearance-pleasant, Not in acute distress. Orientation-Oriented X3. Build & Nutrition-Well nourished and Well developed.  Head and Neck Head-normocephalic, atraumatic . Neck Global Assessment - supple, no bruit auscultated on the right, no bruit auscultated on the left.  Eye Pupil - Bilateral-Regular and Round. Motion - Bilateral-EOMI.  Chest and Lung Exam Auscultation Breath sounds - clear at anterior chest wall and clear at posterior chest wall. Adventitious sounds - No Adventitious sounds.  Cardiovascular Auscultation Rhythm - Regular rate and rhythm. Heart Sounds - S1 WNL and S2 WNL. Murmurs & Other Heart Sounds - Auscultation of the heart reveals - No Murmurs.  Abdomen Palpation/Percussion Tenderness - Abdomen is non-tender to palpation. Rigidity (guarding) - Abdomen is soft. Auscultation Auscultation of the abdomen reveals - Bowel sounds normal.  Female Genitourinary Note: Not done, not pertinent to present illness   Musculoskeletal Note: On exam, she is  alert and oriented in no apparent distress. Her hips show normal motion, no discomfort. Both knees show no effusion. The right knee range about 5 to 125 marked crepitus. Her range of motion, tenderness, medial greater than lateral with no instability. Left knee, no effusion. Range 5 to 125, markedly crepitus on range of motion, some tenderness medial greater than lateral with no instability noted.  RADIOGRAPHS Her radiographs today AP of both knees and lateral show bone-on-bone arthritis in the medial and patellofemoral compartments of both knees.   Assessment & Plan  Primary osteoarthritis of left knee (M17.12) Primary osteoarthritis of right knee (M17.11)  Note:Surgical Plans: Left Total Knee Replacement and a Right Knee Cortisone Injection  Disposition: Home with Husband  PCP: Dr. Alyson Ingles - patient has been seen preoperative and felt to be stable for surgery  IV TXA  Anesthesia Issues: Nausea and vomiting with anesthesia  Signed electronically by Ok Edwards, III PA-C

## 2015-06-23 NOTE — Transfer of Care (Signed)
Immediate Anesthesia Transfer of Care Note  Patient: Alexandria Ruiz  Procedure(s) Performed: Procedure(s): TOTAL LEFT KNEE ARTHROPLASTY (Left) RIGHT KNEE CORTISONE INJECTION (Right)  Patient Location: PACU  Anesthesia Type:Spinal  Level of Consciousness: sedated  Airway & Oxygen Therapy: Patient Spontanous Breathing and Patient connected to face mask oxygen  Post-op Assessment: Report given to RN and Post -op Vital signs reviewed and stable  Post vital signs: Reviewed and stable  Last Vitals:  Filed Vitals:   06/23/15 0637  BP: 140/73  Pulse: 103  Temp: 37.2 C  Resp: 18    Complications: No apparent anesthesia complications

## 2015-06-23 NOTE — Interval H&P Note (Signed)
History and Physical Interval Note:  06/23/2015 6:49 AM  Alexandria Ruiz  has presented today for surgery, with the diagnosis of OA LEFT KNEE   The various methods of treatment have been discussed with the patient and family. After consideration of risks, benefits and other options for treatment, the patient has consented to  Procedure(s): TOTAL LEFT KNEE ARTHROPLASTY (Left) as a surgical intervention .  The patient's history has been reviewed, patient examined, no change in status, stable for surgery.  I have reviewed the patient's chart and labs.  Questions were answered to the patient's satisfaction.     Gearlean Alf

## 2015-06-23 NOTE — Progress Notes (Signed)
PT Cancellation Note  Patient Details Name: Alexandria Ruiz MRN: [id number] DOB: 03-13-1956   Cancelled Treatment:    Reason Eval/Treat Not Completed: Pain limiting ability to participate   [facility] 06/23/2015, 4:22 PM

## 2015-06-24 ENCOUNTER — Encounter (HOSPITAL_COMMUNITY): Payer: Self-pay | Admitting: Orthopedic Surgery

## 2015-06-24 LAB — BASIC METABOLIC PANEL
Anion gap: 10 (ref 5–15)
BUN: 15 mg/dL (ref 6–20)
CALCIUM: 8.8 mg/dL — AB (ref 8.9–10.3)
CO2: 23 mmol/L (ref 22–32)
CREATININE: 0.86 mg/dL (ref 0.44–1.00)
Chloride: 106 mmol/L (ref 101–111)
GFR calc Af Amer: >60 mL/min (ref >60)
GFR calc non Af Amer: >60 mL/min (ref >60)
Glucose, Bld: 227 mg/dL — ABNORMAL HIGH (ref 65–99)
POTASSIUM: 4.5 mmol/L (ref 3.5–5.1)
SODIUM: 139 mmol/L (ref 135–145)

## 2015-06-24 LAB — GLUCOSE, CAPILLARY
GLUCOSE-CAPILLARY: 209 mg/dL — AB (ref 65–99)
GLUCOSE-CAPILLARY: 227 mg/dL — AB (ref 65–99)
Glucose-Capillary: 201 mg/dL — ABNORMAL HIGH (ref 65–99)
Glucose-Capillary: 262 mg/dL — ABNORMAL HIGH (ref 65–99)

## 2015-06-24 LAB — CBC
HEMATOCRIT: 31.4 % — AB (ref 36.0–46.0)
HEMOGLOBIN: 9.7 g/dL — AB (ref 12.0–15.0)
MCH: 25.9 pg — ABNORMAL LOW (ref 26.0–34.0)
MCHC: 30.9 g/dL (ref 30.0–36.0)
MCV: 83.7 fL (ref 78.0–100.0)
PLATELETS: 321 10*3/uL (ref 150–400)
RBC: 3.75 MIL/uL — AB (ref 3.87–5.11)
RDW: 15.3 % (ref 11.5–15.5)
WBC: 8.4 10*3/uL (ref 4.0–10.5)

## 2015-06-24 MED ORDER — RIVAROXABAN 10 MG PO TABS: 10.0000 mg | ORAL_TABLET | Freq: Every day | ORAL | Status: DC

## 2015-06-24 MED ORDER — OXYCODONE HCL 5 MG PO TABS: 5.0000 mg | ORAL_TABLET | ORAL | Status: DC | PRN

## 2015-06-24 MED ORDER — METHOCARBAMOL 500 MG PO TABS: 500.0000 mg | ORAL_TABLET | Freq: Four times a day (QID) | ORAL | Status: DC | PRN

## 2015-06-24 NOTE — Progress Notes (Signed)
Physical Therapy Treatment Note    06/24/15 1600  PT Visit Information  Last PT Received On 06/24/15  Assistance Needed +1  History of Present Illness Pt is a 60 year old female s/p L TKA with hx of obesity, neuropathy, DM  PT Time Calculation  PT Start Time (ACUTE ONLY) 1352  PT Stop Time (ACUTE ONLY) 1416  PT Time Calculation (min) (ACUTE ONLY) 24 min  Subjective Data  Subjective Pt ambulated again in hallway and assisted back to bed.  Spouse present and assisted with sit to stand transfers per pt request.  Precautions  Precautions Fall  Required Braces or Orthoses Knee Immobilizer - Left  Knee Immobilizer - Left Discontinue once straight leg raise with < 10 degree lag  Restrictions  Other Position/Activity Restrictions WBAT  Pain Assessment  Pain Assessment 0-10  Pain Score 6  Pain Location L knee  Pain Descriptors / Indicators Aching;Sore  Pain Intervention(s) Limited activity within patient's tolerance;Monitored during session;Ice applied  Cognition  Arousal/Alertness Awake/alert  Behavior During Therapy WFL for tasks assessed/performed  Overall Cognitive Status Within Functional Limits for tasks assessed  Bed Mobility  Overal bed mobility Needs Assistance  Supine to sit Min assist  General bed mobility comments assist for L LE onto bed  Transfers  Overall transfer level Needs assistance  Equipment used Rolling walker (2 wheeled)  Transfers Sit to/from Stand  Sit to Stand Min assist  General transfer comment verbal cues for UE and LE positioning, spouse providing assistance  Ambulation/Gait  Ambulation/Gait assistance Min guard  Ambulation Distance (Feet) 60 Feet  Assistive device Rolling walker (2 wheeled)  Gait Pattern/deviations Step-to pattern;Antalgic  General Gait Details verbal cues for sequence, RW positoning, step length, reports no increase in knee pain  Gait velocity decreased  PT - End of Session  Equipment Utilized During Treatment Left knee  immobilizer  Activity Tolerance Patient tolerated treatment well  Patient left with call bell/phone within reach;in bed;with family/visitor present  PT - Assessment/Plan  PT Plan Current plan remains appropriate  PT Frequency (ACUTE ONLY) 7X/week  Follow Up Recommendations Home health PT  PT equipment Rolling walker with 5" wheels  PT Goal Progression  Progress towards PT goals Progressing toward goals  PT General Charges  $$ ACUTE PT VISIT 1 Procedure  PT Treatments  $Gait Training 8-22 mins   Carmelia Bake, PT, DPT 06/24/2015 Pager: [phone]

## 2015-06-24 NOTE — Progress Notes (Signed)
   Subjective: 1 Day Post-Op Procedure(s) (LRB): TOTAL LEFT KNEE ARTHROPLASTY (Left) RIGHT KNEE CORTISONE INJECTION (Right) Patient reports pain as moderate and severe last night but better today Patient seen in rounds with Dr. Wynelle Link. Patient is well, but has had some minor complaints of pain in the knee, requiring pain medications We will start therapy today.  Plan is to go Home after hospital stay.  Objective: Vital signs in last 24 hours: Temp:  [97.6 F (36.4 C)-98.3 F (36.8 C)] 98.1 F (36.7 C) (04/11 0523) Pulse Rate:  [88-100] 98 (04/11 0201) Resp:  [13-23] 16 (04/11 0523) BP: (129-148)/(62-87) 134/62 mmHg (04/11 0523) SpO2:  [94 %-99 %] 98 % (04/11 0523) Weight:  [151.048 kg (333 lb)] 151.048 kg (333 lb) (04/10 1201)  Intake/Output from previous day:  Intake/Output Summary (Last 24 hours) at 06/24/15 0856 Last data filed at 06/24/15 0600  Gross per 24 hour  Intake   4190 ml  Output   1735 ml  Net   2455 ml    Intake/Output this shift: UOP 550  Labs:  Recent Labs  06/24/15 0407  HGB 9.7*    Recent Labs  06/24/15 0407  WBC 8.4  RBC 3.75*  HCT 31.4*  PLT 321    Recent Labs  06/24/15 0407  NA 139  K 4.5  CL 106  CO2 23  BUN 15  CREATININE 0.86  GLUCOSE 227*  CALCIUM 8.8*   No results for input(s): LABPT, INR in the last 72 hours.  EXAM General - Patient is Alert, Appropriate and Oriented Extremity - Neurovascular intact Sensation intact distally Dorsiflexion/Plantar flexion intact Dressing - dressing C/D/I Motor Function - intact, moving foot and toes well on exam.  Hemovac pulled without difficulty.  Past Medical History  Diagnosis Date  . Diabetes mellitus   . Hypertension   . PONV (postoperative nausea and vomiting)   . Asthma   . Hyperlipidemia   . Allergic rhinitis   . Peripheral neuropathy ([place])   . Anemia   . Anxiety   . GERD (gastroesophageal reflux disease)   . Abdominal pain   . Bronchitis   . Obesity   . Sleep  apnea     uses c-pap machine  . Shortness of breath dyspnea     since starting Trulicity diabetic med  . Arthritis     Assessment/Plan: 1 Day Post-Op Procedure(s) (LRB): TOTAL LEFT KNEE ARTHROPLASTY (Left) RIGHT KNEE CORTISONE INJECTION (Right) Principal Problem:   OA (osteoarthritis) of knee  Estimated body mass index is 46.46 kg/(m^2) as calculated from the following:   Height as of this encounter: 5\' 11"  (1.803 m).   Weight as of this encounter: 151.048 kg (333 lb). Advance diet Up with therapy Plan for discharge tomorrow Discharge home with home health  DVT Prophylaxis - Xarelto Weight-Bearing as tolerated to right leg D/C O2 and Pulse OX and try on Room Air  [name], PA-C Orthopaedic Surgery 06/24/2015, 8:56 AM

## 2015-06-24 NOTE — Care Management Note (Signed)
Case Management Note  Patient Details  Name: Alexandria Ruiz MRN: [id number] Date of Birth: [birth year]  Subjective/Objective:                  TOTAL LEFT KNEE ARTHROPLASTY (Left) RIGHT KNEE CORTISONE INJECTION (Right) Action/Plan: Discharge planning Expected Discharge Date:  06/25/15               Expected Discharge Plan:  [place]  In-House Referral:     Discharge planning Services  CM Consult  Post Acute Care Choice:    Choice offered to:  Patient  DME Arranged:  3[facility] wide DME Agency:  [place]:  PT [place] Agency:     Status of Service:  Completed, signed off  Medicare Important Message Given:    Date Medicare IM Given:    Medicare IM give by:    Date Additional Medicare IM Given:    Additional Medicare Important Message give by:     If discussed at [place] of Stay Meetings, dates discussed:    Additional Comments: Cm met with pt in room to offer choice of home health agency.  Pt chooses [facility] to render HHPT.  Referral called to [facility] rep, Santiago Glad.  CM called [facility] DME rep, Lecretia to please deliver there bari 3n1 and bari(wide) rolling walker to room prior to discharge.  No to other CM needs were communicated. Dellie Catholic, RN 06/24/2015, 10:17 AM

## 2015-06-24 NOTE — Discharge Summary (Signed)
Physician Discharge Summary   Patient ID: Alexandria Ruiz MRN: [id number] DOB/AGE: 06/21/1955 60 y.o.  Admit date: 06/23/2015 Discharge date: 06/25/2015  Primary Diagnosis:  Osteoarthritis Bilateral knee(s) Admission Diagnoses:  Past Medical History  Diagnosis Date  . Diabetes mellitus   . Hypertension   . PONV (postoperative nausea and vomiting)   . Asthma   . Hyperlipidemia   . Allergic rhinitis   . Peripheral neuropathy ([place])   . Anemia   . Anxiety   . GERD (gastroesophageal reflux disease)   . Abdominal pain   . Bronchitis   . Obesity   . Sleep apnea     uses c-pap machine  . Shortness of breath dyspnea     since starting Trul[employer] diabetic med  . Arthritis    Discharge Diagnoses:   Principal Problem:   OA (osteoarthritis) of knee  Estimated body mass index is 46.46 kg/(m^2) as calculated from the following:   Height as of this encounter: '5\' 11"'$  (1.803 m).   Weight as of this encounter: 151.048 kg (333 lb).  Procedure:  Procedure(s) (LRB): TOTAL LEFT KNEE ARTHROPLASTY (Left) RIGHT KNEE CORTISONE INJECTION (Right)   Consults: None  HPI: Alexandria Ruiz is a 60 y.o. year old female with end stage OA of her left knee with progressively worsening pain and dysfunction. She has constant pain, with activity and at rest and significant functional deficits with difficulties even with ADLs. She has had extensive non-op management including analgesics, injections of cortisone and viscosupplements, and home exercise program, but remains in significant pain with significant dysfunction. Radiographs show bone on bone arthritis all 3 compartments. She presents now for left Total Knee Arthroplasty. She also has right knee OA and requests cortisone injection.  Laboratory Data: Admission on 06/23/2015  Component Date Value Ref Range Status  . Glucose-Capillary 06/23/2015 123* 65 - 99 mg/dL Final  . WBC 06/24/2015 8.4  4.0 - 10.5 K/uL Final  . RBC 06/24/2015 3.75* 3.87 -  5.11 MIL/uL Final  . Hemoglobin 06/24/2015 9.7* 12.0 - 15.0 g/dL Final  . HCT 06/24/2015 31.4* 36.0 - 46.0 % Final  . MCV 06/24/2015 83.7  78.0 - 100.0 fL Final  . MCH 06/24/2015 25.9* 26.0 - 34.0 pg Final  . MCHC 06/24/2015 30.9  30.0 - 36.0 g/dL Final  . RDW 06/24/2015 15.3  11.5 - 15.5 % Final  . Platelets 06/24/2015 321  150 - 400 K/uL Final  . Sodium 06/24/2015 139  135 - 145 mmol/L Final  . Potassium 06/24/2015 4.5  3.5 - 5.1 mmol/L Final  . Chloride 06/24/2015 106  101 - 111 mmol/L Final  . CO2 06/24/2015 23  22 - 32 mmol/L Final  . Glucose, Bld 06/24/2015 227* 65 - 99 mg/dL Final  . BUN 06/24/2015 15  6 - 20 mg/dL Final  . Creatinine, Ser 06/24/2015 0.86  0.44 - 1.00 mg/dL Final  . Calcium 06/24/2015 8.8* 8.9 - 10.3 mg/dL Final  . GFR calc non Af Amer 06/24/2015 >60  >60 mL/min Final  . GFR calc Af Amer 06/24/2015 >60  >60 mL/min Final   Comment: (NOTE) The eGFR has been calculated using the CKD EPI equation. This calculation has not been validated in all clinical situations. eGFR's persistently <60 mL/min signify possible Chronic Kidney Disease.   . Anion gap 06/24/2015 10  5 - 15 Final  . Glucose-Capillary 06/23/2015 222* 65 - 99 mg/dL Final  . Glucose-Capillary 06/24/2015 209* 65 - 99 mg/dL Final  . Glucose-Capillary 06/24/2015 201* 65 -  99 mg/dL Final  . Glucose-Capillary 06/24/2015 262* 65 - 99 mg/dL Final  . Glucose-Capillary 06/24/2015 227* 65 - 99 mg/dL Final  Hospital Outpatient Visit on 06/16/2015  Component Date Value Ref Range Status  . MRSA, PCR 06/16/2015 NEGATIVE  NEGATIVE Final  . Staphylococcus aureus 06/16/2015 NEGATIVE  NEGATIVE Final   Comment:        The Xpert SA Assay (FDA approved for NASAL specimens in patients over 105 years of age), is one component of a comprehensive surveillance program.  Test performance has been validated by [facility] for patients greater than or equal to 46 year old. It is not intended to diagnose infection nor  to guide or monitor treatment.   Marland Kitchen aPTT 06/16/2015 32  24 - 37 seconds Final  . WBC 06/16/2015 8.7  4.0 - 10.5 K/uL Final  . RBC 06/16/2015 3.90  3.87 - 5.11 MIL/uL Final  . Hemoglobin 06/16/2015 10.1* 12.0 - 15.0 g/dL Final  . HCT 47/97/5678 32.6* 36.0 - 46.0 % Final  . MCV 06/16/2015 83.6  78.0 - 100.0 fL Final  . MCH 06/16/2015 25.9* 26.0 - 34.0 pg Final  . MCHC 06/16/2015 31.0  30.0 - 36.0 g/dL Final  . RDW 70/53/4954 15.7* 11.5 - 15.5 % Final  . Platelets 06/16/2015 356  150 - 400 K/uL Final  . Sodium 06/16/2015 141  135 - 145 mmol/L Final  . Potassium 06/16/2015 4.2  3.5 - 5.1 mmol/L Final  . Chloride 06/16/2015 108  101 - 111 mmol/L Final  . CO2 06/16/2015 23  22 - 32 mmol/L Final  . Glucose, Bld 06/16/2015 90  65 - 99 mg/dL Final  . BUN 91/75/8834 17  6 - 20 mg/dL Final  . Creatinine, Ser 06/16/2015 0.97  0.44 - 1.00 mg/dL Final  . Calcium 43/54/3561 9.6  8.9 - 10.3 mg/dL Final  . Total Protein 06/16/2015 7.4  6.5 - 8.1 g/dL Final  . Albumin 43/37/0899 3.9  3.5 - 5.0 g/dL Final  . AST 99/35/5957 18  15 - 41 U/L Final  . ALT 06/16/2015 16  14 - 54 U/L Final  . Alkaline Phosphatase 06/16/2015 75  38 - 126 U/L Final  . Total Bilirubin 06/16/2015 0.8  0.3 - 1.2 mg/dL Final  . GFR calc non Af Amer 06/16/2015 >60  >60 mL/min Final  . GFR calc Af Amer 06/16/2015 >60  >60 mL/min Final   Comment: (NOTE) The eGFR has been calculated using the CKD EPI equation. This calculation has not been validated in all clinical situations. eGFR's persistently <60 mL/min signify possible Chronic Kidney Disease.   . Anion gap 06/16/2015 10  5 - 15 Final  . Prothrombin Time 06/16/2015 13.6  11.6 - 15.2 seconds Final  . INR 06/16/2015 1.02  0.00 - 1.49 Final  . ABO/RH(D) 06/16/2015 O POS   Final  . Antibody Screen 06/16/2015 NEG   Final  . Sample Expiration 06/16/2015 06/26/2015   Final  . Extend sample reason 06/16/2015 NO TRANSFUSIONS OR PREGNANCY IN THE PAST 3 MONTHS   Final  . Color, Urine  06/16/2015 YELLOW  YELLOW Final  . APPearance 06/16/2015 CLEAR  CLEAR Final  . Specific Gravity, Urine 06/16/2015 1.043* 1.005 - 1.030 Final  . pH 06/16/2015 5.5  5.0 - 8.0 Final  . Glucose, UA 06/16/2015 NEGATIVE  NEGATIVE mg/dL Final  . Hgb urine dipstick 06/16/2015 NEGATIVE  NEGATIVE Final  . Bilirubin Urine 06/16/2015 SMALL* NEGATIVE Final  . Ketones, ur 06/16/2015 NEGATIVE  NEGATIVE mg/dL  Final  . Protein, ur 06/16/2015 NEGATIVE  NEGATIVE mg/dL Final  . Nitrite 06/16/2015 NEGATIVE  NEGATIVE Final  . Leukocytes, UA 06/16/2015 SMALL* NEGATIVE Final  . Hgb A1c MFr Bld 06/16/2015 6.8* 4.8 - 5.6 % Final   Comment: (NOTE)         Pre-diabetes: 5.7 - 6.4         Diabetes: >6.4         Glycemic control for adults with diabetes: <7.0   . Mean Plasma Glucose 06/16/2015 148   Final   Comment: (NOTE) Performed At: [facility] [place], [place] [id number] Lindon Romp MD QQ:[id number]   . Squamous Epithelial / LPF 06/16/2015 0-5* NONE SEEN Final  . WBC, UA 06/16/2015 0-5  0 - 5 WBC/hpf Final  . RBC / HPF 06/16/2015 0-5  0 - 5 RBC/hpf Final  . Bacteria, UA 06/16/2015 FEW* NONE SEEN Final  . Urine-Other 06/16/2015 MUCOUS PRESENT   Final  . ABO/RH(D) 06/16/2015 O POS   Final     X-Rays:No results found.  EKG: Orders placed or performed in visit on 06/16/15  . EKG 12-Lead     Hospital Course: PRANAVI AURE is a 60 y.o. who was admitted to [facility]. They were brought to the operating room on 06/23/2015 and underwent Procedure(s): TOTAL LEFT KNEE ARTHROPLASTY RIGHT KNEE CORTISONE INJECTION.  Patient tolerated the procedure well and was later transferred to the recovery room and then to the orthopaedic floor for postoperative care.  They were given PO and IV analgesics for pain control following their surgery.  They were given 24 hours of postoperative antibiotics of  Anti-infectives    Start     Dose/Rate Route Frequency Ordered Stop   06/23/15  1600  ceFAZolin (ANCEF) IVPB 2g/100 mL premix     2 g 200 mL/hr over 30 Minutes Intravenous Every 6 hours 06/23/15 1200 06/23/15 2247   06/23/15 0600  ceFAZolin (ANCEF) 3 g in dextrose 5 % 50 mL IVPB     3 g 130 mL/hr over 30 Minutes Intravenous On call to O.R. 06/22/15 1321 06/23/15 0939     and started on DVT prophylaxis in the form of Xarelto.   PT and OT were ordered for total joint protocol.  Discharge planning consulted to help with postop disposition and equipment needs.  Patient had a rough night on the evening of surgery.  They started to get up OOB with therapy on day one. Hemovac drain was pulled without difficulty.  Continued to work with therapy into day two.  Dressing was changed on day two and the incision was healing well.  The oxy was increased in strength.  She improved and was ready to go home later on POD 2.  Discharge home with home health Diet - Cardiac diet and Diabetic diet Follow up - in 2 weeks Activity - WBAT Disposition - Home Condition Upon Discharge - Good D/C Meds - See DC Summary DVT Prophylaxis - Xarelto  Discharge Instructions    Call MD / Call 911    Complete by:  As directed   If you experience chest pain or shortness of breath, CALL 911 and be transported to the hospital emergency room.  If you develope a fever above 101 F, pus (white drainage) or increased drainage or redness at the wound, or calf pain, call your surgeon's office.     Change dressing    Complete by:  As directed   Change dressing daily  with sterile 4 x 4 inch gauze dressing and apply TED hose. Do not submerge the incision under water.     Constipation Prevention    Complete by:  As directed   Drink plenty of fluids.  Prune juice may be helpful.  You may use a stool softener, such as Colace (over the counter) 100 mg twice a day.  Use MiraLax (over the counter) for constipation as needed.     Diet - low sodium heart healthy    Complete by:  As directed      Diet Carb Modified     Complete by:  As directed      Discharge instructions    Complete by:  As directed   Pick up stool softner and laxative for home use following surgery while on pain medications. Do not submerge incision under water. Please use good hand washing techniques while changing dressing each day. May shower starting three days after surgery. Please use a clean towel to pat the incision dry following showers. Continue to use ice for pain and swelling after surgery. Do not use any lotions or creams on the incision until instructed by your surgeon.  Take Xarelto for two and a half more weeks, then discontinue Xarelto. Once the patient has completed the Xarelto, they may resume the 81 mg Aspirin.  Postoperative Constipation Protocol  Constipation - defined medically as fewer than three stools per week and severe constipation as less than one stool per week.  One of the most common issues patients have following surgery is constipation.  Even if you have a regular bowel pattern at home, your normal regimen is likely to be disrupted due to multiple reasons following surgery.  Combination of anesthesia, postoperative narcotics, change in appetite and fluid intake all can affect your bowels.  In order to avoid complications following surgery, here are some recommendations in order to help you during your recovery period.  Colace (docusate) - Pick up an over-the-counter form of Colace or another stool softener and take twice a day as long as you are requiring postoperative pain medications.  Take with a full glass of water daily.  If you experience loose stools or diarrhea, hold the colace until you stool forms back up.  If your symptoms do not get better within 1 week or if they get worse, check with your doctor.  Dulcolax (bisacodyl) - Pick up over-the-counter and take as directed by the product packaging as needed to assist with the movement of your bowels.  Take with a full glass of water.  Use this product  as needed if not relieved by Colace only.   MiraLax (polyethylene glycol) - Pick up over-the-counter to have on hand.  MiraLax is a solution that will increase the amount of water in your bowels to assist with bowel movements.  Take as directed and can mix with a glass of water, juice, soda, coffee, or tea.  Take if you go more than two days without a movement. Do not use MiraLax more than once per day. Call your doctor if you are still constipated or irregular after using this medication for 7 days in a row.  If you continue to have problems with postoperative constipation, please contact the office for further assistance and recommendations.  If you experience "the worst abdominal pain ever" or develop nausea or vomiting, please contact the office immediatly for further recommendations for treatment.     Do not put a pillow under the knee. Place it under  the heel.    Complete by:  As directed      Do not sit on low chairs, stoools or toilet seats, as it may be difficult to get up from low surfaces    Complete by:  As directed      Driving restrictions    Complete by:  As directed   No driving until released by the physician.     Increase activity slowly as tolerated    Complete by:  As directed      Lifting restrictions    Complete by:  As directed   No lifting until released by the physician.     Patient may shower    Complete by:  As directed   You may shower without a dressing once there is no drainage.  Do not wash over the wound.  If drainage remains, do not shower until drainage stops.     TED hose    Complete by:  As directed   Use stockings (TED hose) for 3 weeks on both leg(s).  You may remove them at night for sleeping.     Weight bearing as tolerated    Complete by:  As directed   Laterality:  left  Extremity:  Lower            Medication List    STOP taking these medications        aspirin 81 MG tablet     diclofenac 75 MG EC tablet  Commonly known as:  VOLTAREN       multivitamin with minerals Tabs tablet     TRULICITY 6.23 JS/2.8BT Sopn  Generic drug:  Dulaglutide     vitamin E 400 UNIT capsule  Generic drug:  vitamin E      TAKE these medications        albuterol 108 (90 Base) MCG/ACT inhaler  Commonly known as:  PROVENTIL HFA;VENTOLIN HFA  Inhale 2 puffs into the lungs every 6 (six) hours as needed for wheezing.     atorvastatin 40 MG tablet  Commonly known as:  LIPITOR  Take 40 mg by mouth daily.     AZOR 5-40 MG tablet  Generic drug:  amLODipine-olmesartan  Take 1 tablet by mouth daily.     cetirizine 10 MG tablet  Commonly known as:  ZYRTEC  Take 10 mg by mouth every morning.     escitalopram 10 MG tablet  Commonly known as:  LEXAPRO  Take 10 mg by mouth every morning.     hydrOXYzine 25 MG tablet  Commonly known as:  ATARAX/VISTARIL  Take 25 mg by mouth 3 (three) times daily as needed for itching.     methocarbamol 500 MG tablet  Commonly known as:  ROBAXIN  Take 1 tablet (500 mg total) by mouth every 6 (six) hours as needed for muscle spasms.     mometasone-formoterol 100-5 MCG/ACT Aero  Commonly known as:  DULERA  Inhale 2 puffs into the lungs 2 (two) times daily.     oxyCODONE 5 MG immediate release tablet  Commonly known as:  Oxy IR/ROXICODONE  Take 1-2 tablets (5-10 mg total) by mouth every 3 (three) hours as needed for moderate pain or severe pain.     RABEprazole 20 MG tablet  Commonly known as:  ACIPHEX  Take 20 mg by mouth daily.     rivaroxaban 10 MG Tabs tablet  Commonly known as:  XARELTO  Take 1 tablet (10 mg total) by mouth daily with breakfast. Take  Xarelto for two and a half more weeks, then discontinue Xarelto. Once the patient has completed the Xarelto, they may resume the 81 mg Aspirin.     valACYclovir 1000 MG tablet  Commonly known as:  VALTREX  Take 1,000 mg by mouth daily.     zolpidem 10 MG tablet  Commonly known as:  AMBIEN  Take 5-10 mg by mouth at bedtime as needed for sleep.            Follow-up Information    Follow up with [facility].   Why:  rolling walker and 3n1   Contact information:   [place] [address] [phone]       Follow up with [facility].   Why:  home health physical therapy   Contact information:   [address] [place] [address] [phone]       Follow up with Gearlean Alf, MD On 07/08/2015.   Specialty:  Orthopedic Surgery   Why:  Call office at [phone] to setup appt on Tuesday 07/08/2015 with Dr. Wynelle Link.   Contact information:   [address] [place] [address] [phone]       Signed: [name], PA-C Orthopaedic Surgery 06/24/2015, 10:49 PM

## 2015-06-24 NOTE — Progress Notes (Signed)
Occupational Therapy Evaluation Patient Details Name: Alexandria Ruiz MRN: [id number] DOB: [birth year] Today's Date: 06/24/2015    History of Present Illness Pt is a 60 year old female s/p L TKA with hx of obesity, neuropathy, DM   Clinical Impression   Patient is s/p L TKA resulting in deficits listed below. She will benefit from skilled OT to maximize independence and safety with ADLs to allow discharge to the venue listed below. OT will follow.    Follow Up Recommendations  No OT follow up;Supervision/Assistance - 24 hour    Equipment Recommendations  None recommended by OT    Recommendations for Other Services       Precautions / Restrictions Precautions Precautions: Fall Required Braces or Orthoses: Knee Immobilizer - Left Knee Immobilizer - Left: Discontinue once straight leg raise with < 10 degree lag Restrictions Weight Bearing Restrictions: No Other Position/Activity Restrictions: WBAT      Mobility Bed Mobility Overal bed mobility: Needs Assistance Bed Mobility: Supine to Sit     Supine to sit: Min assist;HOB elevated     General bed mobility comments: NT -- OOB  Transfers Overall transfer level: Needs assistance Equipment used: Rolling walker (2 wheeled) Transfers: Sit to/from Stand Sit to Stand: From elevated surface;Min guard         General transfer comment: verbal cues for UE and LE positioning    Balance                                            ADL Overall ADL's : Needs assistance/impaired Eating/Feeding: Independent;Sitting   Grooming: Wash/dry hands;Min guard;Standing   Upper Body Bathing: Set up;Sitting   Lower Body Bathing: Moderate assistance;Sit to/from stand   Upper Body Dressing : Set up;Sitting   Lower Body Dressing: Moderate assistance;Sit to/from stand   Toilet Transfer: Min guard;Ambulation;Regular Toilet;BSC;RW;Grab bars   Toileting- Clothing Manipulation and Hygiene: Minimal assistance;Sit  to/from stand       Functional mobility during ADLs: Min guard;Minimal assistance;Rolling walker General ADL Comments: Patient assisted with toileting task, donning underwear, grooming at sink. Up to recliner at end of session.     Vision     Perception     Praxis      Pertinent Vitals/Pain Pain Assessment: 0-10 Pain Score: 6  Pain Location: L knee Pain Descriptors / Indicators: Aching;Sore Pain Intervention(s): Premedicated before session;Repositioned;Monitored during session;Ice applied     Hand Dominance     Extremity/Trunk Assessment Upper Extremity Assessment Upper Extremity Assessment: Overall WFL for tasks assessed   Lower Extremity Assessment Lower Extremity Assessment: Defer to PT evaluation        Communication Communication Communication: No difficulties   Cognition Arousal/Alertness: Awake/alert Behavior During Therapy: WFL for tasks assessed/performed Overall Cognitive Status: Within Functional Limits for tasks assessed                     General Comments       Exercises       Shoulder Instructions      Home Living Family/patient expects to be discharged to:: Private residence Living Arrangements: Spouse/significant other Available Help at Discharge: Family;Available 24 hours/day Type of Home: House Home Access: Stairs to enter [employer] of Steps: 3 Entrance Stairs-Rails: Can reach both Home Layout: One level     Bathroom Shower/Tub: Teacher, Dylana [employer] years/pre: Standard Bathroom Accessibility: Yes How Accessible:  Accessible via walker Home Equipment: Bedside commode          Prior Functioning/Environment Level of Independence: Independent             OT Diagnosis: Acute pain   OT Problem List: Decreased strength;Decreased range of motion;Decreased knowledge of use of DME or AE;Pain   OT Treatment/Interventions: Self-care/ADL training;DME and/or AE instruction;Therapeutic  activities;Patient/family education    OT Goals(Current goals can be found in the care plan section) Acute Rehab OT Goals Patient Stated Goal: to go home tomorrow OT Goal Formulation: With patient Time For Goal Achievement: 07/08/15 Potential to Achieve Goals: Good ADL Goals Pt Will Perform Lower Body Bathing: with min assist;sit to/from stand Pt Will Perform Lower Body Dressing: with min assist;sit to/from stand Pt Will Transfer to Toilet: with supervision;ambulating;bedside commode Pt Will Perform Toileting - Clothing Manipulation and hygiene: with supervision;sit to/from stand Pt Will Perform Tub/Shower Transfer: Tub transfer;with min assist;ambulating;rolling walker  OT Frequency: Min 2X/week   Barriers to D/C:            Co-evaluation              End of Session Equipment Utilized During Treatment: Rolling walker;Left knee immobilizer Nurse Communication: Mobility status  Activity Tolerance: Patient tolerated treatment well Patient left: in chair;with call bell/phone within reach;with chair alarm set;with family/visitor present   Time: [id number] OT Time Calculation (min): 18 min Charges:  OT General Charges $OT Visit: 1 Procedure OT Evaluation $OT Eval Low Complexity: 1 Procedure G-Codes:    Dequavius Kuhner A Jun 27, 2015, 2:13 PM

## 2015-06-24 NOTE — Evaluation (Signed)
Physical Therapy Evaluation Patient Details Name: Alexandria Ruiz MRN: [id number] DOB: [birth year] Today's Date: 06/24/2015   History of Present Illness  Pt is a 60 year old female s/p L TKA with hx of obesity, neuropathy, DM  Clinical Impression  Pt is s/p L TKA resulting in the deficits listed below (see PT Problem List).  Pt will benefit from skilled PT to increase their independence and safety with mobility to allow discharge to the venue listed below.  Pt performed LE exercises and ambulated short distance today.  Pt plans to d/c home with assist from her spouse.     Follow Up Recommendations Home health PT    Equipment Recommendations  Rolling walker with 5" wheels (wide)    Recommendations for Other Services       Precautions / Restrictions Precautions Precautions: Fall Required Braces or Orthoses: Knee Immobilizer - Left Knee Immobilizer - Left: Discontinue once straight leg raise with < 10 degree lag Restrictions Weight Bearing Restrictions: No Other Position/Activity Restrictions: WBAT      Mobility  Bed Mobility Overal bed mobility: Needs Assistance Bed Mobility: Supine to Sit     Supine to sit: Min assist;HOB elevated     General bed mobility comments: verbal cues for technique, assist for L LE  Transfers Overall transfer level: Needs assistance Equipment used: Rolling walker (2 wheeled) Transfers: Sit to/from Stand Sit to Stand: From elevated surface;Min assist;+2 safety/equipment         General transfer comment: verbal cues for UE and LE positioning  Ambulation/Gait Ambulation/Gait assistance: Min guard Ambulation Distance (Feet): 60 Feet Assistive device: Rolling walker (2 wheeled) Gait Pattern/deviations: Step-to pattern;Antalgic Gait velocity: decreased   General Gait Details: verbal cues for sequence, RW positoning, step length, reports no increase in knee pain  Stairs            Wheelchair Mobility    Modified Rankin (Stroke  Patients Only)       Balance                                             Pertinent Vitals/Pain Pain Assessment: 0-10 Pain Score: 6  Pain Location: L knee Pain Descriptors / Indicators: Aching;Sore Pain Intervention(s): Premedicated before session;Repositioned;Monitored during session;Ice applied    Home Living Family/patient expects to be discharged to:: Private residence Living Arrangements: Spouse/significant other   Type of Home: House Home Access: Stairs to enter Entrance Stairs-Rails: Can reach both Entrance Stairs-Number of Steps: 3 Home Layout: One level Home Equipment: None      Prior Function Level of Independence: Independent               Hand Dominance        Extremity/Trunk Assessment               Lower Extremity Assessment: LLE deficits/detail   LLE Deficits / Details: unable to perform SLR, fair quad contraction, L knee AAROM 40* limited by pain     Communication   Communication: No difficulties  Cognition Arousal/Alertness: Awake/alert Behavior During Therapy: WFL for tasks assessed/performed Overall Cognitive Status: Within Functional Limits for tasks assessed                      General Comments      Exercises Total Joint Exercises Ankle Circles/Pumps: AROM;Both;10 reps Quad Sets: AROM;Left;10 reps Short Arc QuadSinclair Ship;Left;10  reps Heel Slides: 10 reps;Left;AAROM Hip ABduction/ADduction: AROM;Left;10 reps      Assessment/Plan    PT Assessment Patient needs continued PT services  PT Diagnosis Difficulty walking;Acute pain   PT Problem List Decreased strength;Decreased mobility;Decreased activity tolerance;Decreased range of motion;Pain;Obesity;Decreased knowledge of use of DME  PT Treatment Interventions Stair training;Gait training;DME instruction;Patient/family education;Therapeutic activities;Therapeutic exercise;Functional mobility training   PT Goals (Current goals can be found in the  Care Plan section) Acute Rehab PT Goals PT Goal Formulation: With patient Time For Goal Achievement: 06/28/15 Potential to Achieve Goals: Good    Frequency 7X/week   Barriers to discharge        Co-evaluation               End of Session Equipment Utilized During Treatment: Gait belt;Left knee immobilizer Activity Tolerance: Patient tolerated treatment well Patient left: with family/visitor present (in bathroom, spouse in room, OT waiting just outside room for pt to use pull cord)           Time: [id number] PT Time Calculation (min) (ACUTE ONLY): 26 min   Charges:   PT Evaluation $PT Eval Moderate Complexity: 1 Procedure PT Treatments $Therapeutic Exercise: 8-22 mins   PT G Codes:        Alexandria Ruiz,Alexandria Ruiz 06/24/2015, 12:55 PM Carmelia Bake, PT, DPT 06/24/2015 Pager: [phone]

## 2015-06-24 NOTE — Discharge Instructions (Addendum)
° °Dr. Frank Aluisio °Total Joint Specialist °[facility] °[address] °[place], [place] [address] °[phone] ° °TOTAL KNEE REPLACEMENT POSTOPERATIVE DIRECTIONS ° °Knee Rehabilitation, Guidelines Following Surgery  °Results after knee surgery are often greatly improved when you follow the exercise, range of motion and muscle strengthening exercises prescribed by your doctor. Safety measures are also important to protect the knee from further injury. Any time any of these exercises cause you to have increased pain or swelling in your knee joint, decrease the amount until you are comfortable again and slowly increase them. If you have problems or questions, call your caregiver or physical therapist for advice.  ° °HOME CARE INSTRUCTIONS  °Remove items at home which could result in a fall. This includes throw rugs or furniture in walking pathways.  °· ICE to the affected knee every three hours for 30 minutes at a time and then as needed for pain and swelling.  Continue to use ice on the knee for pain and swelling from surgery. You may notice swelling that will progress down to the foot and ankle.  This is normal after surgery.  Elevate the leg when you are not up walking on it.   °· Continue to use the breathing machine which will help keep your temperature down.  It is common for your temperature to cycle up and down following surgery, especially at night when you are not up moving around and exerting yourself.  The breathing machine keeps your lungs expanded and your temperature down. °· Do not place pillow under knee, focus on keeping the knee straight while resting ° °DIET °You may resume your previous home diet once your are discharged from the hospital. ° °DRESSING / WOUND CARE / SHOWERING °You may shower 3 days after surgery, but keep the wounds dry during showering.  You may use an occlusive plastic wrap (Press'n Seal for example), NO SOAKING/SUBMERGING IN THE BATHTUB.  If the  bandage gets wet, change with a clean dry gauze.  If the incision gets wet, pat the wound dry with a clean towel. °You may start showering once you are discharged home but do not submerge the incision under water. Just pat the incision dry and apply a dry gauze dressing on daily. °Change the surgical dressing daily and reapply a dry dressing each time. ° °ACTIVITY °Walk with your walker as instructed. °Use walker as long as suggested by your caregivers. °Avoid periods of inactivity such as sitting longer than an hour when not asleep. This helps prevent blood clots.  °You may resume a sexual relationship in one month or when given the OK by your doctor.  °You may return to work once you are cleared by your doctor.  °Do not drive a car for 6 weeks or until released by you surgeon.  °Do not drive while taking narcotics. ° °WEIGHT BEARING °Weight bearing as tolerated with assist device (walker, cane, etc) as directed, use it as long as suggested by your surgeon or therapist, typically at least 4-6 weeks. ° °POSTOPERATIVE CONSTIPATION PROTOCOL °Constipation - defined medically as fewer than three stools per week and severe constipation as less than one stool per week. ° °One of the most common issues patients have following surgery is constipation.  Even if you have a regular bowel pattern at home, your normal regimen is likely to be disrupted due to multiple reasons following surgery.  Combination of anesthesia, postoperative narcotics, change in appetite and fluid intake all can affect your bowels.    In order to avoid complications following surgery, here are some recommendations in order to help you during your recovery period. ° °Colace (docusate) - Pick up an over-the-counter form of Colace or another stool softener and take twice a day as long as you are requiring postoperative pain medications.  Take with a full glass of water daily.  If you experience loose stools or diarrhea, hold the colace until you stool forms  back up.  If your symptoms do not get better within 1 week or if they get worse, check with your doctor. ° °Dulcolax (bisacodyl) - Pick up over-the-counter and take as directed by the product packaging as needed to assist with the movement of your bowels.  Take with a full glass of water.  Use this product as needed if not relieved by Colace only.  ° °MiraLax (polyethylene glycol) - Pick up over-the-counter to have on hand.  MiraLax is a solution that will increase the amount of water in your bowels to assist with bowel movements.  Take as directed and can mix with a glass of water, juice, soda, coffee, or tea.  Take if you go more than two days without a movement. °Do not use MiraLax more than once per day. Call your doctor if you are still constipated or irregular after using this medication for 7 days in a row. ° °If you continue to have problems with postoperative constipation, please contact the office for further assistance and recommendations.  If you experience "the worst abdominal pain ever" or develop nausea or vomiting, please contact the office immediatly for further recommendations for treatment. ° °ITCHING ° If you experience itching with your medications, try taking only a single pain pill, or even half a pain pill at a time.  You can also use Benadryl over the counter for itching or also to help with sleep.  ° °TED HOSE STOCKINGS °Wear the elastic stockings on both legs for three weeks following surgery during the day but you may remove then at night for sleeping. ° °MEDICATIONS °See your medication summary on the “After Visit Summary” that the nursing staff will review with you prior to discharge.  You may have some home medications which will be placed on hold until you complete the course of blood thinner medication.  It is important for you to complete the blood thinner medication as prescribed by your surgeon.  Continue your approved medications as instructed at time of  discharge. ° °PRECAUTIONS °If you experience chest pain or shortness of breath - call 911 immediately for transfer to the hospital emergency department.  °If you develop a fever greater that 101 F, purulent drainage from wound, increased redness or drainage from wound, foul odor from the wound/dressing, or calf pain - CONTACT YOUR SURGEON.   °                                                °FOLLOW-UP APPOINTMENTS °Make sure you keep all of your appointments after your operation with your surgeon and caregivers. You should call the office at the above phone number and make an appointment for approximately two weeks after the date of your surgery or on the date instructed by your surgeon outlined in the "After Visit Summary". ° ° °RANGE OF MOTION AND STRENGTHENING EXERCISES  °Rehabilitation of the knee is important following a knee injury or   an operation. After just a few days of immobilization, the muscles of the thigh which control the knee become weakened and shrink (atrophy). Knee exercises are designed to build up the tone and strength of the thigh muscles and to improve knee motion. Often times heat used for twenty to thirty minutes before working out will loosen up your tissues and help with improving the range of motion but do not use heat for the first two weeks following surgery. These exercises can be done on a training (exercise) mat, on the floor, on a table or on a bed. Use what ever works the best and is most comfortable for you Knee exercises include:  °Leg Lifts - While your knee is still immobilized in a splint or cast, you can do straight leg raises. Lift the leg to 60 degrees, hold for 3 sec, and slowly lower the leg. Repeat 10-20 times 2-3 times daily. Perform this exercise against resistance later as your knee gets better.  °Quad and Hamstring Sets - Tighten up the muscle on the front of the thigh (Quad) and hold for 5-10 sec. Repeat this 10-20 times hourly. Hamstring sets are done by pushing the  foot backward against an object and holding for 5-10 sec. Repeat as with quad sets.  °· Leg Slides: Lying on your back, slowly slide your foot toward your buttocks, bending your knee up off the floor (only go as far as is comfortable). Then slowly slide your foot back down until your leg is flat on the floor again. °· Angel Wings: Lying on your back spread your legs to the side as far apart as you can without causing discomfort.  °A rehabilitation program following serious knee injuries can speed recovery and prevent re-injury in the future due to weakened muscles. Contact your doctor or a physical therapist for more information on knee rehabilitation.  ° °IF YOU ARE TRANSFERRED TO A SKILLED REHAB FACILITY °If the patient is transferred to a skilled rehab facility following release from the hospital, a list of the current medications will be sent to the facility for the patient to continue.  When discharged from the skilled rehab facility, please have the facility set up the patient's Home Health Physical Therapy prior to being released. Also, the skilled facility will be responsible for providing the patient with their medications at time of release from the facility to include their pain medication, the muscle relaxants, and their blood thinner medication. If the patient is still at the rehab facility at time of the two week follow up appointment, the skilled rehab facility will also need to assist the patient in arranging follow up appointment in our office and any transportation needs. ° °MAKE SURE YOU:  °Understand these instructions.  °Get help right away if you are not doing well or get worse.  ° ° °Pick up stool softner and laxative for home use following surgery while on pain medications. °Do not submerge incision under water. °Please use good hand washing techniques while changing dressing each day. °May shower starting three days after surgery. °Please use a clean towel to pat the incision dry following  showers. °Continue to use ice for pain and swelling after surgery. °Do not use any lotions or creams on the incision until instructed by your surgeon. ° °Take Xarelto for two and a half more weeks, then discontinue Xarelto. °Once the patient has completed the Xarelto, they may resume the 81 mg Aspirin. ° ° °Information on my medicine - XARELTO® (Rivaroxaban) ° °  This medication education was reviewed with me or my healthcare representative as part of my discharge preparation.  The pharmacist that spoke with me during my hospital stay was:  Minda Ditto, [facility]  Why was Xarelto prescribed for you? Xarelto was prescribed for you to reduce the risk of blood clots forming after orthopedic surgery. The medical term for these abnormal blood clots is venous thromboembolism (VTE).  What do you need to know about xarelto ? Take your Xarelto ONCE DAILY at the same time every day. You may take it either with or without food.  If you have difficulty swallowing the tablet whole, you may crush it and mix in applesauce just prior to taking your dose.  Take Xarelto exactly as prescribed by your doctor and DO NOT stop taking Xarelto without talking to the doctor who prescribed the medication.  Stopping without other VTE prevention medication to take the place of Xarelto may increase your risk of developing a clot.  After discharge, you should have regular check-up appointments with your healthcare provider that is prescribing your Xarelto.    What do you do if you miss a dose? If you miss a dose, take it as soon as you remember on the same day then continue your regularly scheduled once daily regimen the next day. Do not take two doses of Xarelto on the same day.   Important Safety Information A possible side effect of Xarelto is bleeding. You should call your healthcare provider right away if you experience any of the following: ? Bleeding from an injury or your nose that does not stop. ? Unusual  colored urine (red or dark brown) or unusual colored stools (red or black). ? Unusual bruising for unknown reasons. ? A serious fall or if you hit your head (even if there is no bleeding).  Some medicines may interact with Xarelto and might increase your risk of bleeding while on Xarelto. To help avoid this, consult your healthcare provider or pharmacist prior to using any new prescription or non-prescription medications, including herbals, vitamins, non-steroidal anti-inflammatory drugs (NSAIDs) and supplements.  - Discussed holding ASA and Diclofenax while on Xarelto.  Make sure Vitamin E is 400 units, do not take higher dose while on Xarelto.  This website has more information on Xarelto: [email or web address]

## 2015-06-25 LAB — BASIC METABOLIC PANEL
Anion gap: 9 (ref 5–15)
BUN: 17 mg/dL (ref 6–20)
CO2: 25 mmol/L (ref 22–32)
CREATININE: 0.76 mg/dL (ref 0.44–1.00)
Calcium: 9.6 mg/dL (ref 8.9–10.3)
Chloride: 106 mmol/L (ref 101–111)
Glucose, Bld: 243 mg/dL — ABNORMAL HIGH (ref 65–99)
POTASSIUM: 4.3 mmol/L (ref 3.5–5.1)
SODIUM: 140 mmol/L (ref 135–145)

## 2015-06-25 LAB — CBC
HEMATOCRIT: 28.6 % — AB (ref 36.0–46.0)
HEMOGLOBIN: 9 g/dL — AB (ref 12.0–15.0)
MCH: 26 pg (ref 26.0–34.0)
MCHC: 31.5 g/dL (ref 30.0–36.0)
MCV: 82.7 fL (ref 78.0–100.0)
Platelets: 320 10*3/uL (ref 150–400)
RBC: 3.46 MIL/uL — ABNORMAL LOW (ref 3.87–5.11)
RDW: 15.4 % (ref 11.5–15.5)
WBC: 11.7 10*3/uL — AB (ref 4.0–10.5)

## 2015-06-25 LAB — GLUCOSE, CAPILLARY: Glucose-Capillary: 166 mg/dL — ABNORMAL HIGH (ref 65–99)

## 2015-06-25 MED ORDER — METHOCARBAMOL 500 MG PO TABS: 500.0000 mg | ORAL_TABLET | Freq: Four times a day (QID) | ORAL | Status: DC | PRN

## 2015-06-25 MED ORDER — RIVAROXABAN 10 MG PO TABS: 10.0000 mg | ORAL_TABLET | Freq: Every day | ORAL | Status: DC

## 2015-06-25 MED ORDER — OXYCODONE HCL 10 MG PO TABS: 10.0000 mg | ORAL_TABLET | ORAL | Status: DC | PRN

## 2015-06-25 MED ORDER — OXYCODONE HCL 5 MG PO TABS
10.0000 mg | ORAL_TABLET | ORAL | Status: DC | PRN
  Administered 2015-06-25: 15 mg via ORAL
  Filled 2015-06-25: qty 3

## 2015-06-25 NOTE — Progress Notes (Signed)
Occupational Therapy Treatment Patient Details Name: Alexandria Ruiz MRN: [id number] DOB: [birth year] Today's Date: 06/25/2015    History of present illness Pt is a 60 year old female s/p L TKA with hx of obesity, neuropathy, DM   OT comments  All OT education completed and pt questions answered. Patient to d/c home today with spouse's assistance. No further OT needs; will sign off.   Follow Up Recommendations  No OT follow up;Supervision/Assistance - 24 hour    Equipment Recommendations  None recommended by OT    Recommendations for Other Services      Precautions / Restrictions Precautions Precautions: Fall Required Braces or Orthoses: Knee Immobilizer - Left Knee Immobilizer - Left: Discontinue once straight leg raise with < 10 degree lag Restrictions Weight Bearing Restrictions: No Other Position/Activity Restrictions: WBAT       Mobility Bed Mobility                  Transfers                      Balance                                   ADL Overall ADL's : Needs assistance/impaired                                       General ADL Comments: Patient reports increased pain due to just having finished sponge bathing herself. She reports she did her bath on her own after being set up by nursing staff. Patient declined to practice toilet transfer at this time stating she is getting a lot of practice when she gets up with nurses. Patient also declined practicing tub transfer -- states she will sponge bathe until she feels confident getting her leg over side of tub. No further OT needs at this time.      Vision                     Perception     Praxis      Cognition   Behavior During Therapy: WFL for tasks assessed/performed Overall Cognitive Status: Within Functional Limits for tasks assessed                       Extremity/Trunk Assessment               Exercises     Shoulder  Instructions       General Comments      Pertinent Vitals/ Pain       Pain Assessment: Faces Faces Pain Scale: Hurts even more Pain Location: L knee Pain Descriptors / Indicators: Aching;Sore;Grimacing Pain Intervention(s): Limited activity within patient's tolerance;Monitored during session;Other (comment) (pt reports nurse is bringing pain medications)  Home Living                                          Prior Functioning/Environment              Frequency       Progress Toward Goals  OT Goals(current goals can now be found in the care plan section)  Progress towards OT goals: Goals met/education completed, patient  discharged from [facility] goals met and education completed, patient discharged from OT services    Co-evaluation                 End of Session    Activity Tolerance Patient tolerated treatment well   Patient Left in bed;with call bell/phone within reach;with bed alarm set   Nurse Communication Patient requests pain meds        Time: 5146-0479 OT Time Calculation (min): 10 min  Charges: OT General Charges $OT Visit: 1 Procedure OT Treatments $Self Care/Home Management : 8-22 mins  Alexandria Ruiz A 06/25/2015, 12:15 PM

## 2015-06-25 NOTE — Progress Notes (Signed)
   Subjective: 2 Days Post-Op Procedure(s) (LRB): TOTAL LEFT KNEE ARTHROPLASTY (Left) RIGHT KNEE CORTISONE INJECTION (Right) Patient reports pain as mild and moderate.   Patient seen in rounds with Dr. Wynelle Link.  Increased the oxy oral pain medication. Patient is well, but has had some minor complaints of pain in the knee, requiring pain medications Patient is ready to go home later today.  Objective: Vital signs in last 24 hours: Temp:  [98.8 F (37.1 C)-100.1 F (37.8 C)] 98.8 F (37.1 C) (04/12 0502) Pulse Rate:  [86-102] 86 (04/12 0502) Resp:  [16-18] 16 (04/12 0502) BP: (121-134)/(64-71) 134/71 mmHg (04/12 0502) SpO2:  [95 %-98 %] 97 % (04/12 0502)  Intake/Output from previous day:  Intake/Output Summary (Last 24 hours) at 06/25/15 0707 Last data filed at 06/25/15 0503  Gross per 24 hour  Intake   1320 ml  Output    175 ml  Net   1145 ml    Labs:  Recent Labs  06/24/15 0407 06/25/15 0409  HGB 9.7* 9.0*    Recent Labs  06/24/15 0407 06/25/15 0409  WBC 8.4 11.7*  RBC 3.75* 3.46*  HCT 31.4* 28.6*  PLT 321 320    Recent Labs  06/24/15 0407 06/25/15 0409  NA 139 140  K 4.5 4.3  CL 106 106  CO2 23 25  BUN 15 17  CREATININE 0.86 0.76  GLUCOSE 227* 243*  CALCIUM 8.8* 9.6   No results for input(s): LABPT, INR in the last 72 hours.  EXAM: General - Patient is Alert, Appropriate and Oriented Extremity - Neurovascular intact Sensation intact distally Dorsiflexion/Plantar flexion intact Incision - clean, dry, no drainage Motor Function - intact, moving foot and toes well on exam.   Assessment/Plan: 2 Days Post-Op Procedure(s) (LRB): TOTAL LEFT KNEE ARTHROPLASTY (Left) RIGHT KNEE CORTISONE INJECTION (Right) Procedure(s) (LRB): TOTAL LEFT KNEE ARTHROPLASTY (Left) RIGHT KNEE CORTISONE INJECTION (Right) Past Medical History  Diagnosis Date  . Diabetes mellitus   . Hypertension   . PONV (postoperative nausea and vomiting)   . Asthma   .  Hyperlipidemia   . Allergic rhinitis   . Peripheral neuropathy ([place])   . Anemia   . Anxiety   . GERD (gastroesophageal reflux disease)   . Abdominal pain   . Bronchitis   . Obesity   . Sleep apnea     uses c-pap machine  . Shortness of breath dyspnea     since starting Trulicity diabetic med  . Arthritis    Principal Problem:   OA (osteoarthritis) of knee  Estimated body mass index is 46.46 kg/(m^2) as calculated from the following:   Height as of this encounter: 5\' 11"  (1.803 m).   Weight as of this encounter: 151.048 kg (333 lb). Up with therapy Discharge home with home health Diet - Cardiac diet and Diabetic diet Follow up - in 2 weeks Activity - WBAT Disposition - Home Condition Upon Discharge - Good D/C Meds - See DC Summary DVT Prophylaxis - Xarelto  [name], PA-C Orthopaedic Surgery 06/25/2015, 7:07 AM

## 2015-06-25 NOTE — Progress Notes (Signed)
[facility]    [facility] is providing the following services: RW (patient declined commode)  If patient discharges after hours, please call [phone].   Linward Headland 06/25/2015, 10:28 AM

## 2015-06-25 NOTE — Progress Notes (Signed)
Physical Therapy Treatment Patient Details Name: Alexandria Ruiz MRN: [id number] DOB: 1956-01-22 Today's Date: 06/25/2015    History of Present Illness Pt is a 60 year old female s/p L TKA with hx of obesity, neuropathy, DM    PT Comments    Pt ambulated in hallway and performed LE exercises.  Pt feels ready for d/c home and had no further questions.  Follow Up Recommendations  Home health PT     Equipment Recommendations  Rolling walker with 5" wheels    Recommendations for Other Services       Precautions / Restrictions Precautions Precautions: Fall Required Braces or Orthoses: Knee Immobilizer - Left Knee Immobilizer - Left: Discontinue once straight leg raise with < 10 degree lag Restrictions Weight Bearing Restrictions: No Other Position/Activity Restrictions: WBAT    Mobility  Bed Mobility Overal bed mobility: Needs Assistance Bed Mobility: Supine to Sit;Sit to Supine     Supine to sit: Min assist Sit to supine: Min assist   General bed mobility comments: assist for L LE onto bed  Transfers Overall transfer level: Needs assistance Equipment used: Rolling walker (2 wheeled) Transfers: Sit to/from Stand Sit to Stand: Min guard         General transfer comment: verbal cues for UE and LE positioning  Ambulation/Gait Ambulation/Gait assistance: Min guard Ambulation Distance (Feet): 60 Feet Assistive device: Rolling walker (2 wheeled) Gait Pattern/deviations: Step-to pattern;Antalgic Gait velocity: 0.154 m/s Gait velocity interpretation: <1.8 ft/sec, indicative of risk for recurrent falls General Gait Details: fatigues quickly, reports no increase in knee pain   Stairs            Wheelchair Mobility    Modified Rankin (Stroke Patients Only)       Balance                                    Cognition Arousal/Alertness: Awake/alert Behavior During Therapy: WFL for tasks assessed/performed Overall Cognitive Status:  Within Functional Limits for tasks assessed                      Exercises Total Joint Exercises Ankle Circles/Pumps: AROM;Both;10 reps Quad Sets: AROM;Left;10 reps Short Arc QuadSinclair Ship;Left;10 reps Heel Slides: 10 reps;Left;AAROM Hip ABduction/ADduction: AROM;Left;10 reps    General Comments        Pertinent Vitals/Pain Pain Assessment: Faces Pain Score: 5  Faces Pain Scale: Hurts even more Pain Location: L knee Pain Descriptors / Indicators: Aching;Sore Pain Intervention(s): Limited activity within patient's tolerance;Monitored during session;Premedicated before session;Ice applied    Home Living                      Prior Function            PT Goals (current goals can now be found in the care plan section) Progress towards PT goals: Progressing toward goals    Frequency  7X/week    PT Plan Current plan remains appropriate    Co-evaluation             End of Session Equipment Utilized During Treatment: Left knee immobilizer Activity Tolerance: Patient tolerated treatment well Patient left: with call bell/phone within reach;in bed     Time: 1029-1100 PT Time Calculation (min) (ACUTE ONLY): 31 min  Charges:  $Gait Training: 8-22 mins $Therapeutic Exercise: 8-22 mins  G Codes:      Oralee Rapaport,KATHrine E 07/05/15, 12:36 PM Carmelia Bake, PT, DPT 07-05-15 Pager: [phone]

## 2016-01-06 ENCOUNTER — Other Ambulatory Visit: Payer: Self-pay | Admitting: Family Medicine

## 2016-01-06 ENCOUNTER — Ambulatory Visit
Admission: RE | Admit: 2016-01-06 | Discharge: 2016-01-06 | Disposition: A | Discharge status: Home / Self Care | Payer: 59 | Source: Ambulatory Visit | Attending: Family Medicine | Admitting: Family Medicine

## 2016-01-06 DIAGNOSIS — R0602 Shortness of breath: Secondary | ICD-10-CM

## 2016-01-14 ENCOUNTER — Other Ambulatory Visit: Payer: Self-pay | Admitting: Family Medicine

## 2016-01-15 ENCOUNTER — Other Ambulatory Visit: Payer: Self-pay | Admitting: Family Medicine

## 2016-01-15 DIAGNOSIS — R0602 Shortness of breath: Secondary | ICD-10-CM

## 2016-01-15 DIAGNOSIS — R7989 Other specified abnormal findings of blood chemistry: Secondary | ICD-10-CM

## 2016-01-16 ENCOUNTER — Ambulatory Visit
Admission: RE | Admit: 2016-01-16 | Discharge: 2016-01-16 | Disposition: A | Discharge status: Home / Self Care | Payer: 59 | Source: Ambulatory Visit | Attending: Family Medicine | Admitting: Family Medicine

## 2016-01-16 DIAGNOSIS — R0602 Shortness of breath: Secondary | ICD-10-CM

## 2016-01-16 DIAGNOSIS — R7989 Other specified abnormal findings of blood chemistry: Secondary | ICD-10-CM

## 2016-01-16 MED ORDER — IOPAMIDOL (ISOVUE-370) INJECTION 76%
100.0000 mL | Freq: Once | INTRAVENOUS | Status: AC | PRN
  Administered 2016-01-16: 100 mL via INTRAVENOUS

## 2016-04-06 DIAGNOSIS — M1711 Unilateral primary osteoarthritis, right knee: Secondary | ICD-10-CM | POA: Diagnosis not present

## 2016-04-08 ENCOUNTER — Encounter: Payer: Self-pay | Admitting: Pulmonary Disease

## 2016-04-08 ENCOUNTER — Ambulatory Visit (INDEPENDENT_AMBULATORY_CARE_PROVIDER_SITE_OTHER): Payer: 59 | Admitting: Pulmonary Disease

## 2016-04-08 DIAGNOSIS — R0600 Dyspnea, unspecified: Secondary | ICD-10-CM

## 2016-04-08 DIAGNOSIS — R0609 Other forms of dyspnea: Secondary | ICD-10-CM | POA: Diagnosis not present

## 2016-04-08 DIAGNOSIS — J4540 Moderate persistent asthma, uncomplicated: Secondary | ICD-10-CM

## 2016-04-08 DIAGNOSIS — J45909 Unspecified asthma, uncomplicated: Secondary | ICD-10-CM | POA: Insufficient documentation
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
## 2016-04-08 NOTE — Assessment & Plan Note (Signed)
Alexandria Ruiz has been complaining of dyspnea for over a year. Objectively she is a prior smoker who has clear lungs on exam, normal oxygenation at rest, and CT chest imaging showed only mild atelectasis in the setting of significant obesity in November 2017.  I explained to her that the differential diagnosis of dyspnea is broad and includes lung disease, heart disease, anemia, neurologic illness among others.  In her particular case I think it's very likely that the expiration for her dyspnea is anemia and obesity with deconditioning. However, considering her smoking history it's worthwhile to perform full pulmonary function testing to assess for COPD. While I expect they will show restrictive lung disease due to her obesity, I do not anticipate to see any evidence of interstitial lung disease considering the appearance of her lungs on the recent CT scan.  Plan: Obtain records from recent cardiac workup Obtain records from primary care physician for most recent hemoglobin value Function test Follow-up here in 2-3 weeks with PFT with nurse practitioner If no evidence of lung disease seen besides asthma, then the recommendation will be to lose weight through exercise and diet.

## 2016-04-08 NOTE — Patient Instructions (Addendum)
We will obtain records from your primary care physician's office for your most recent blood work and cardiac evaluation We will obtain a lung function test We will see you back in 2-3 weeks to go over the results of the lung function test

## 2016-04-08 NOTE — Assessment & Plan Note (Signed)
It sounds as if her asthma has been stable recently. She will continue taking her Dulera and Spiriva for now. If there is evidence of significant airflow obstruction on lung function testing then we can check a serum IgE and serum eosinophils on the next visit.

## 2016-04-12 ENCOUNTER — Ambulatory Visit (INDEPENDENT_AMBULATORY_CARE_PROVIDER_SITE_OTHER): Payer: 59 | Admitting: Pulmonary Disease

## 2016-04-12 DIAGNOSIS — J4540 Moderate persistent asthma, uncomplicated: Secondary | ICD-10-CM

## 2016-04-12 DIAGNOSIS — R0600 Dyspnea, unspecified: Secondary | ICD-10-CM

## 2016-04-12 DIAGNOSIS — R0609 Other forms of dyspnea: Secondary | ICD-10-CM

## 2016-04-12 LAB — PULMONARY FUNCTION TEST
DL/VA % pred: 75 %
DL/VA: 4.08 ml/min/mmHg/L
DLCO UNC: 16.86 ml/min/mmHg
DLCO cor % pred: 51 %
DLCO cor: 16.61 ml/min/mmHg
DLCO unc % pred: 52 %
FEF 25-75 POST: 3.05 L/s
FEF 25-75 Pre: 3.02 L/sec
FEF2575-%Change-Post: 1 %
FEF2575-%Pred-Post: 118 %
FEF2575-%Pred-Pre: 117 %
FEV1-%CHANGE-POST: -3 %
FEV1-%PRED-POST: 85 %
FEV1-%PRED-PRE: 88 %
FEV1-POST: 2.3 L
FEV1-PRE: 2.39 L
FEV1FVC-%Change-Post: 3 %
FEV1FVC-%PRED-PRE: 106 %
FEV6-%Change-Post: -6 %
FEV6-%PRED-POST: 79 %
FEV6-%PRED-PRE: 84 %
FEV6-POST: 2.62 L
FEV6-Pre: 2.8 L
FEV6FVC-%Change-Post: 0 %
FEV6FVC-%PRED-POST: 103 %
FEV6FVC-%Pred-Pre: 102 %
FVC-%CHANGE-POST: -6 %
FVC-%PRED-POST: 77 %
FVC-%PRED-PRE: 82 %
FVC-POST: 2.62 L
FVC-PRE: 2.81 L
POST FEV6/FVC RATIO: 100 %
Post FEV1/FVC ratio: 88 %
Pre FEV1/FVC ratio: 85 %
Pre FEV6/FVC Ratio: 100 %
RV % PRED: 81 %
RV: 1.85 L
TLC % PRED: 79 %
TLC: 4.74 L

## 2016-04-20 DIAGNOSIS — M2550 Pain in unspecified joint: Secondary | ICD-10-CM | POA: Diagnosis not present

## 2016-04-20 DIAGNOSIS — R760 Raised antibody titer: Secondary | ICD-10-CM | POA: Diagnosis not present

## 2016-04-20 DIAGNOSIS — M7532 Calcific tendinitis of left shoulder: Secondary | ICD-10-CM | POA: Diagnosis not present

## 2016-04-20 DIAGNOSIS — R700 Elevated erythrocyte sedimentation rate: Secondary | ICD-10-CM | POA: Diagnosis not present

## 2016-04-26 ENCOUNTER — Ambulatory Visit: Payer: 59 | Admitting: Adult Health

## 2016-04-28 DIAGNOSIS — M4316 Spondylolisthesis, lumbar region: Secondary | ICD-10-CM | POA: Diagnosis not present

## 2016-05-05 DIAGNOSIS — E119 Type 2 diabetes mellitus without complications: Secondary | ICD-10-CM | POA: Diagnosis not present

## 2016-05-05 DIAGNOSIS — E782 Mixed hyperlipidemia: Secondary | ICD-10-CM | POA: Diagnosis not present

## 2016-05-05 DIAGNOSIS — K219 Gastro-esophageal reflux disease without esophagitis: Secondary | ICD-10-CM | POA: Diagnosis not present

## 2016-05-05 DIAGNOSIS — I10 Essential (primary) hypertension: Secondary | ICD-10-CM | POA: Diagnosis not present

## 2016-05-06 ENCOUNTER — Ambulatory Visit (INDEPENDENT_AMBULATORY_CARE_PROVIDER_SITE_OTHER): Payer: 59 | Admitting: Adult Health

## 2016-05-06 ENCOUNTER — Encounter: Payer: Self-pay | Admitting: Adult Health

## 2016-05-06 DIAGNOSIS — J4520 Mild intermittent asthma, uncomplicated: Secondary | ICD-10-CM | POA: Diagnosis not present

## 2016-05-06 NOTE — Progress Notes (Signed)
@Patient  ID: Alexandria Ruiz, female    DOB: 1955-05-07, 61 y.o.   MRN: [id number]  Chief Complaint  Patient presents with  . Follow-up    dyspnea     Referring provider: Maury Dus, MD  HPI: 61 yo female former smoker seen for dyspnea   05/06/2016 Follow up: Dyspnea  Pt returns for 4 week follow up . She was seen last ov for dyspnea that has been present over last year. Get sob with walking long distance.  Walks with cane , joint issues.  She is on dulera and spiriva . Believes spiriva has helped .  She had PFT 04/12/2016. That was normal with no airflow obstruction .  FEV1 85%, ratio 88, FVC 77%, DLCO 52%. No BD response .  We discussed these results.  She does have anemia which she says she is following with her PCP .    Allergies  Allergen Reactions  . Aspirin     Stomach upset   . Diovan [Valsartan]     cough    Immunization History  Administered Date(s) Administered  . Influenza Split 12/11/2015  . Influenza Whole 12/13/2008  . Pneumococcal Polysaccharide-23 03/15/2006    Past Medical History:  Diagnosis Date  . Abdominal pain   . Allergic rhinitis   . Anemia   . Anxiety   . Arthritis   . Asthma   . Bronchitis   . Diabetes mellitus   . GERD (gastroesophageal reflux disease)   . Hyperlipidemia   . Hypertension   . Obesity   . Peripheral neuropathy ([place])   . PONV (postoperative nausea and vomiting)   . Shortness of breath dyspnea    since starting Trulicity diabetic med  . Sleep apnea    uses c-pap machine    Tobacco History: History  Smoking Status  . Former Smoker  . Packs/day: 0.50  . Years: 30.00  . Quit date: 10/29/2006  Smokeless Tobacco  . Never Used   Counseling given: Not Answered   Outpatient Encounter Prescriptions as of 05/06/2016  Medication Sig  . albuterol (PROVENTIL HFA;VENTOLIN HFA) 108 (90 BASE) MCG/ACT inhaler Inhale 2 puffs into the lungs every 6 (six) hours as needed for wheezing.  Marland Kitchen amLODipine-olmesartan (AZOR)  5-40 MG per tablet Take 1 tablet by mouth daily.  Marland Kitchen atorvastatin (LIPITOR) 40 MG tablet Take 40 mg by mouth daily.  . cetirizine (ZYRTEC) 10 MG tablet Take 10 mg by mouth every morning.  . escitalopram (LEXAPRO) 10 MG tablet Take 10 mg by mouth every morning.  . mometasone-formoterol (DULERA) 100-5 MCG/ACT AERO Inhale 2 puffs into the lungs 2 (two) times daily.  Marland Kitchen SPIRIVA RESPIMAT 1.25 MCG/ACT AERS   . valACYclovir (VALTREX) 1000 MG tablet Take 1,000 mg by mouth daily.   Marland Kitchen zolpidem (AMBIEN) 10 MG tablet Take 5-10 mg by mouth at bedtime as needed for sleep.  . hydrOXYzine (ATARAX/VISTARIL) 25 MG tablet Take 25 mg by mouth 3 (three) times daily as needed for itching.   . methocarbamol (ROBAXIN) 500 MG tablet Take 1 tablet (500 mg total) by mouth every 6 (six) hours as needed for muscle spasms. (Patient not taking: Reported on 05/06/2016)  . RABEprazole (ACIPHEX) 20 MG tablet Take 20 mg by mouth daily.  . [DISCONTINUED] oxyCODONE 10 MG TABS Take 1-2 tablets (10-20 mg total) by mouth every 3 (three) hours as needed for moderate pain or severe pain. (Patient not taking: Reported on 05/06/2016)   No facility-administered encounter medications on file as of 05/06/2016.  Review of Systems  Constitutional:   No  weight loss, night sweats,  Fevers, chills,  +fatigue, or  lassitude.  HEENT:   No headaches,  Difficulty swallowing,  Tooth/dental problems, or  Sore throat,                No sneezing, itching, ear ache, nasal congestion, post nasal drip,   CV:  No chest pain,  Orthopnea, PND, swelling in lower extremities, anasarca, dizziness, palpitations, syncope.   GI  No heartburn, indigestion, abdominal pain, nausea, vomiting, diarrhea, change in bowel habits, loss of appetite, bloody stools.   Resp:   No excess mucus, no productive cough,  No non-productive cough,  No coughing up of blood.  No change in color of mucus.  No wheezing.  No chest wall deformity  Skin: no rash or lesions.  GU: no  dysuria, change in color of urine, no urgency or frequency.  No flank pain, no hematuria   MS:  No joint pain or swelling.  No decreased range of motion.  No back pain.    Physical Exam  BP 126/74 (BP Location: Left Arm, Cuff Size: Large)   Pulse 84   Ht 5\' 10"  (1.778 m)   Wt (!) 330 lb (149.7 kg)   SpO2 96%   BMI 47.35 kg/m   GEN: A/Ox3; pleasant , NAD, obese    HEENT:  [place]/AT,  EACs-clear, TMs-wnl, NOSE-clear, THROAT-clear, no lesions, no postnasal drip or exudate noted.   NECK:  Supple w/ fair ROM; no JVD; normal carotid impulses w/o bruits; no thyromegaly or nodules palpated; no lymphadenopathy.    RESP  Clear  P & A; w/o, wheezes/ rales/ or rhonchi. no accessory muscle use, no dullness to percussion  CARD:  RRR, no m/r/g, no peripheral edema, pulses intact, no cyanosis or clubbing.  GI:   Soft & nt; nml bowel sounds; no organomegaly or masses detected.   Musco: Warm bil, no deformities or joint swelling noted.   Neuro: alert, no focal deficits noted.    Skin: Warm, no lesions or rashes    Lab Results:  Imaging: No results found.   Assessment & Plan:   No problem-specific Assessment & Plan notes found for this encounter.     Rexene Edison, NP 05/06/2016

## 2016-05-06 NOTE — Patient Instructions (Signed)
Continue on Dulera and Spiriva .  Advance your exercise as tolerated.  Follow up with Dr. Joneen Caraway for your anemia .  Follow up Dr. Lake Bells in 3 months and As needed

## 2016-05-11 DIAGNOSIS — M4316 Spondylolisthesis, lumbar region: Secondary | ICD-10-CM | POA: Diagnosis not present

## 2016-05-17 NOTE — Assessment & Plan Note (Signed)
Possible asthma component , no siginificant COPD on PFT .  She feels clinically better on current inhaler regimen will cont to montior  Reevaluate on return   Plan  Patient Instructions  Continue on Dulera and Spiriva .  Advance your exercise as tolerated.  Follow up with Dr. Joneen Caraway for your anemia .  Follow up Dr. Lake Bells in 3 months and As needed

## 2016-05-17 NOTE — Assessment & Plan Note (Signed)
Follow up with PCP

## 2016-07-13 DIAGNOSIS — R05 Cough: Secondary | ICD-10-CM | POA: Diagnosis not present

## 2016-07-13 DIAGNOSIS — R0602 Shortness of breath: Secondary | ICD-10-CM | POA: Diagnosis not present

## 2016-07-13 DIAGNOSIS — J309 Allergic rhinitis, unspecified: Secondary | ICD-10-CM | POA: Diagnosis not present

## 2016-07-15 DIAGNOSIS — M1711 Unilateral primary osteoarthritis, right knee: Secondary | ICD-10-CM | POA: Diagnosis not present

## 2016-08-11 ENCOUNTER — Ambulatory Visit: Payer: 59 | Admitting: Pulmonary Disease

## 2016-08-17 ENCOUNTER — Ambulatory Visit (HOSPITAL_COMMUNITY)
Admission: EM | Admit: 2016-08-17 | Discharge: 2016-08-17 | Disposition: A | Discharge status: Home / Self Care | Payer: 59 | Attending: Internal Medicine | Admitting: Internal Medicine

## 2016-08-17 ENCOUNTER — Encounter (HOSPITAL_COMMUNITY): Payer: Self-pay | Admitting: Emergency Medicine

## 2016-08-17 DIAGNOSIS — M7989 Other specified soft tissue disorders: Secondary | ICD-10-CM

## 2016-08-17 DIAGNOSIS — S8012XA Contusion of left lower leg, initial encounter: Secondary | ICD-10-CM

## 2016-08-17 MED ORDER — MEDICAL COMPRESSION STOCKINGS MISC: 1.0000 [IU] | 0 refills | Status: DC

## 2016-08-17 MED ORDER — CEPHALEXIN 500 MG PO CAPS: 500.0000 mg | ORAL_CAPSULE | Freq: Two times a day (BID) | ORAL | 0 refills | Status: DC
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
## 2016-08-17 NOTE — Discharge Instructions (Addendum)
Deep bruise on the left shin will take a few weeks to heal.  Newer warmth/redness/swelling/pain suggests inflammation, less likely infection.  Over the knee elastic stocking should help, put on in the morning and take off at bedtime.  Prescription for cephalexin (antibiotic) to help in the case of infection, sent to pharmacy also.  Recheck or followoup with primary care provider if not starting to gradually improve as expected.

## 2016-08-19 DIAGNOSIS — G4733 Obstructive sleep apnea (adult) (pediatric): Secondary | ICD-10-CM | POA: Diagnosis not present

## 2016-09-10 DIAGNOSIS — M545 Low back pain: Secondary | ICD-10-CM | POA: Diagnosis not present

## 2016-09-10 DIAGNOSIS — M25552 Pain in left hip: Secondary | ICD-10-CM | POA: Diagnosis not present

## 2016-09-10 DIAGNOSIS — M25551 Pain in right hip: Secondary | ICD-10-CM | POA: Diagnosis not present

## 2016-09-10 DIAGNOSIS — M48061 Spinal stenosis, lumbar region without neurogenic claudication: Secondary | ICD-10-CM | POA: Diagnosis not present

## 2016-09-10 DIAGNOSIS — M4316 Spondylolisthesis, lumbar region: Secondary | ICD-10-CM | POA: Diagnosis not present

## 2016-09-30 DIAGNOSIS — G4733 Obstructive sleep apnea (adult) (pediatric): Secondary | ICD-10-CM | POA: Diagnosis not present

## 2016-11-30 DIAGNOSIS — E119 Type 2 diabetes mellitus without complications: Secondary | ICD-10-CM | POA: Diagnosis not present

## 2016-11-30 DIAGNOSIS — Z23 Encounter for immunization: Secondary | ICD-10-CM | POA: Diagnosis not present

## 2016-11-30 DIAGNOSIS — E782 Mixed hyperlipidemia: Secondary | ICD-10-CM | POA: Diagnosis not present

## 2016-11-30 DIAGNOSIS — I10 Essential (primary) hypertension: Secondary | ICD-10-CM | POA: Diagnosis not present

## 2017-03-14 DIAGNOSIS — E119 Type 2 diabetes mellitus without complications: Secondary | ICD-10-CM | POA: Diagnosis not present

## 2017-03-14 DIAGNOSIS — H353131 Nonexudative age-related macular degeneration, bilateral, early dry stage: Secondary | ICD-10-CM | POA: Diagnosis not present

## 2017-03-24 DIAGNOSIS — M1711 Unilateral primary osteoarthritis, right knee: Secondary | ICD-10-CM | POA: Diagnosis not present

## 2017-04-20 DIAGNOSIS — M65341 Trigger finger, right ring finger: Secondary | ICD-10-CM | POA: Diagnosis not present

## 2017-04-26 DIAGNOSIS — M533 Sacrococcygeal disorders, not elsewhere classified: Secondary | ICD-10-CM | POA: Diagnosis not present

## 2017-04-26 DIAGNOSIS — M545 Low back pain: Secondary | ICD-10-CM | POA: Diagnosis not present

## 2017-04-26 DIAGNOSIS — M5136 Other intervertebral disc degeneration, lumbar region: Secondary | ICD-10-CM | POA: Diagnosis not present

## 2017-04-26 DIAGNOSIS — M4316 Spondylolisthesis, lumbar region: Secondary | ICD-10-CM | POA: Diagnosis not present

## 2017-04-26 DIAGNOSIS — Z96652 Presence of left artificial knee joint: Secondary | ICD-10-CM | POA: Diagnosis not present

## 2017-05-02 DIAGNOSIS — Z01419 Encounter for gynecological examination (general) (routine) without abnormal findings: Secondary | ICD-10-CM | POA: Diagnosis not present

## 2017-05-05 DIAGNOSIS — M47816 Spondylosis without myelopathy or radiculopathy, lumbar region: Secondary | ICD-10-CM | POA: Diagnosis not present

## 2017-05-05 DIAGNOSIS — R509 Fever, unspecified: Secondary | ICD-10-CM | POA: Diagnosis not present

## 2017-05-05 NOTE — Progress Notes (Signed)
Need orders for 4-1- surgery in epic

## 2017-05-08 ENCOUNTER — Ambulatory Visit: Payer: Self-pay | Admitting: Orthopedic Surgery

## 2017-05-18 DIAGNOSIS — M65341 Trigger finger, right ring finger: Secondary | ICD-10-CM | POA: Diagnosis not present

## 2017-05-23 DIAGNOSIS — M5136 Other intervertebral disc degeneration, lumbar region: Secondary | ICD-10-CM | POA: Diagnosis not present

## 2017-05-23 DIAGNOSIS — M545 Low back pain: Secondary | ICD-10-CM | POA: Diagnosis not present

## 2017-05-31 DIAGNOSIS — G4733 Obstructive sleep apnea (adult) (pediatric): Secondary | ICD-10-CM | POA: Diagnosis not present

## 2017-06-02 DIAGNOSIS — R05 Cough: Secondary | ICD-10-CM | POA: Diagnosis not present

## 2017-06-02 DIAGNOSIS — R0602 Shortness of breath: Secondary | ICD-10-CM | POA: Diagnosis not present

## 2017-06-02 DIAGNOSIS — J309 Allergic rhinitis, unspecified: Secondary | ICD-10-CM | POA: Diagnosis not present

## 2017-06-08 DIAGNOSIS — I10 Essential (primary) hypertension: Secondary | ICD-10-CM | POA: Diagnosis not present

## 2017-06-08 DIAGNOSIS — E782 Mixed hyperlipidemia: Secondary | ICD-10-CM | POA: Diagnosis not present

## 2017-06-08 DIAGNOSIS — E119 Type 2 diabetes mellitus without complications: Secondary | ICD-10-CM | POA: Diagnosis not present

## 2017-07-01 ENCOUNTER — Ambulatory Visit: Payer: Self-pay | Admitting: Orthopedic Surgery

## 2017-07-01 NOTE — H&P (Signed)
MADDYX, VALLIE (62yo, F)  DOB    [birth year]  Chief Complaint Right Knee Pain H&P right TKA on 07/25/2017 at [facility]  Patient's Care Team Primary Care Provider: Estill Bakes MD: [address] [place], [place], [place] [address], Ph [phone], Fax [phone] NPI: [id number]  Patient's Pharmacies [id number]): [place], [place] [address], Ph [phone], Fax [phone]) [phone] [facility] [place] ([place]): [address], [place] [address], Ph [phone], Fax [phone]   Vitals Ht:        5 ft 11 in  Wt:       309 lbs  BMI:     43.1  BP:      134/84 sitting R arm  Pulse:  84 bpm    Allergies Reviewed Allergies HYDROCODONE: Itching      OXYCODONE: Itching             Medications Reviewed Medications amlodipine 5 mg-olmesartan 40 mg tablet TAKE 1 TABLET EVERY MORNING 02/12/17   filled           surescripts aspirin 81 mg tablet 06/28/17   entered       ALEXZANDREW PERKINS, PA-C atorvastatin 40 mg tablet 1 TABLET ONCE A DAY BY MOUTH 02/17/17   filled           surescripts diclofenac sodium 75 mg tablet,delayed release TAKE 1 TABLET BY MOUTH TWICE A DAY 02/18/17   filled           surescripts escitalopram 10 mg tablet TAKE 1 TABLET BY MOUTH EVERY DAY 03/09/17   filled           surescripts Ferrex 150 mg iron capsule TAKE 1 CAPSULE BY MOUTH TWICE A DAY 03/09/17   filled           surescripts Pennsaid 20 mg/gram/actuation (2 %) topical soln in metered-dose pump APPLY 2 PUMPS (40 MG) TO THE AFFECTED AREA BY TOPICAL ROUTE 2 TIMES PER DAY 05/27/17   prescribed  Gaynelle Arabian, MD RABEprazole 20 mg tablet,delayed release TAKE 1 TABLET BY MOUTH TWICE A DAY 04/22/17   filled           OPTUMRX Spiriva Respimat 1.25 mcg/actuation solution for inhalation 06/28/17   entered       Vanessa Cumine triamcinolone acetonide 0.1 % topical cream 1 APPLICATION TO AFFECTED  AREA TWICE A DAY, SPARRINGLY AS NEEDED FOR RASH EXTERNALLY 30 DAYS 02/18/17   filled           surescripts Trulicity 1.5 MO/2.9 mL subcutaneous pen injector 0.5 ML ONCE A WEEK ON SATURDAYS SUBCUTANEOUS 04/22/17   filled           OPTUMRX   Problems Reviewed Problems Lumbar spondylosis - Onset: 05/05/2017 Degeneration of lumbar intervertebral disc - Onset: 04/26/2017 Low back pain - Onset: 04/26/2017 Osteoarthritis of right knee joint - Onset: 03/24/2017 History of left total knee replacement - Onset: 06/23/2015 Acquired trigger finger of right ring finger - Onset: 04/20/2017 Lumbosacral spondylolisthesis - Onset: 05/23/2017   Family History Reviewed Family History Mother - Rheumatoid arthritis             - Hypertensive disorder             - Congestive heart failure Unspecified Relation   - Diabetes mellitus   Social History Reviewed Social History Smoking Status: Former smoker Tobacco-years of use: 30 Chewing tobacco: none Alcohol intake: None  Hand Dominance: Right Work related injury?: N Advance directive: N Medical Power of Attorney: N   Surgical History Reviewed Surgical History Tonsillectomy Sinus surgery procedure Nasal septoplasty Ankle Surgery - Right Ankle Partial hysterectomy Reduction mammoplasty - Bilateral Foot/Ankle Surgery - Bilateral Knee Arthroscopy - Left Knee Carpal Tunnel Release - Bilateral Knee Joint Replacement - Left Knee - 2016   GYN History Most Recent Mammogram: 04/25/2017.   Past Medical History Reviewed Past Medical History Anemia: Y - Past History Anxiety Disorder: Y Asthma: Y Diabetes: Y GERD/Reflux: Y High Cholesterol: Y Hypertension: Y Joint Pain: Y Osteoporosis: Y Rheumatoid Arthritis: Y Sleep apnea: Y Swelling of Legs/Feet/Hands: Y    HPI The patient is here today for a pre-operative History and Physical. They are scheduled for right total knee replacement on 07/25/2017 with Dr. Wynelle Link at  [facility]. The patient is a 62 year old female who has been followed for their knee. The patient is known to have right knee pain and osteoarthritis. They are now month(s) out from cortisone injection. Symptoms reported include: pain, aching, popping, grinding, difficulty ambulating and difficulty arising from chair. The patient feels that they are doing poorly. Current treatment includes: bracing. The patient has reported only temporary improvement of their symptoms with Cortisone injections. Her right knee is giving her increasing problems. She had a cortisone shot over three months ago and it did not provided a tremendous amount of benefit and only for a short time. She said the knee is definitely limiting what she can and cannot do. Her left total knee is doing extremely well and she is very pleased with that a little bit over a year out. We discussed treatment options including repeat injection, which is not worth it because she did not get any benefit from the last ones. She does not want to do anymore injections. We discussed pursuing total knee arthroplasty. She has reached a point where she would like to proceed with surgery at this time.   ROS Constitutional: Constitutional: no fever, chills, night sweats, or significant weight loss.  Cardiovascular: Cardiovascular: no palpitations or chest pain.  Respiratory: Respiratory: no cough or shortness of breath and No COPD; occasional asthma flare.  Gastrointestinal: Gastrointestinal: no vomiting or nausea.  Musculoskeletal: Musculoskeletal: no swelling in Joints and Joint Pain.  Neurologic: Neurologic: no numbness, tingling, or difficulty with balance.   Physical Exam Patient is a 62 year old female.  General Mental Status - Alert, cooperative and good historian. General Appearance - pleasant, Not in acute distress. Orientation - Oriented X3. Build & Nutrition - Well nourished and Well developed, Overweight,  Obese  Head and Neck Head - normocephalic, atraumatic . Neck Global Assessment - supple, no bruit auscultated on the right, no bruit auscultated on the left.  Eye Wears glasses Pupil - Bilateral - PERR Motion - Bilateral - EOMI.  Chest and Lung Exam Auscultation Breath sounds - clear at anterior chest wall and clear at posterior chest wall except for some slight inspiratory wheeze in the right lower and mid posterior lung field.   Cardiovascular Auscultation Rhythm - Regular rate and rhythm. Heart Sounds - S1 WNL and S2 WNL. Murmurs & Other Heart Sounds - Auscultation of the heart reveals - No Murmurs.  Abdomen Palpation/Percussion Tenderness - Abdomen is non-tender to palpation. Abdomen is soft. Auscultation Auscultation of the abdomen reveals - Bowel sounds normal. Protuberant abdomen  Genitourinary Note: Not done, not pertinent to present illness  Musculoskeletal Left knee looks excellent. Range about 0 to 115 with  no tenderness or instability. Right knee shows moderate crepitus on range of motion, range 5 to 110. She is tender medial greater than lateral with no instability.   Assessment / Plan 1. Osteoarthritis of right knee joint M17.11: Unilateral primary osteoarthritis, right knee  2. History of left total knee replacement Z96.652: Presence of left artificial knee joint  Goals Patient Instructions Surgical Plans: Right Total Knee Replacement Disposition: Home, Straight to Outpatient Therapy PCP: Dr. Alyson Ingles - seen and clear to proceed with surgery as stated by patient at time of H&P IV TXA Anesthesia Issues: Nausea Patient was instructed on what medications to stop prior to surgery. - Follow up visit in 2 weeks with Dr. Wynelle Link - Begin physical therapy following surgery at [facility] - orders were sent to setup first therapy session. Provider to call patient to setup first PT EVAL on Friday 07/29/17 - Dr. Wynelle Link - Right Total Knee Arthroplasty -  Pre-operative lab work as pre Pre-Surgical Testing - Prescriptions will be provided in hospital at time of discharge  Anticipated LOS equal to or greater than 2 midnights due to - Age 4 and older with one or more of the following:             - Obesity             - Expected need for hospital services (PT, OT, Nursing) required for safe   discharge             - Anticipated need for postoperative skilled nursing care or inpatient rehab             - Active co-morbidities:   Asthma:  Diabetes:  GERD/Reflux:  High Cholesterol:  Hypertension:  Joint Pain:  Osteoporosis:  Rheumatoid Arthritis:  Sleep apnea:  Swelling of Legs/Feet/Hands:   Return to Office Sallee Lange, DPT for Physical Therapy Evaluation at [facility] on 07/29/2017 at 09:00 AM Gaynelle Arabian, MD for 5-Post-Op at [facility] on 08/09/2017 at 01:15 PM  Encounter signed-off by Mickel Crow, PA-C

## 2017-07-01 NOTE — H&P (Signed)
Alexandria Ruiz, Alexandria Ruiz (80HY, F)  DOB [birth year]  Chief Complaint Right Knee Pain H&P right TKA on 07/25/2017 at [facility]  Patient's Care Team Primary Care Provider: Estill Bakes MD: [address] [place], [place], [place] [address], Ph [phone], Fax [phone] NPI: [id number]  Patient's Pharmacies [id number]): [address] [place], [place] [address], Ph [phone], Fax [phone]) [phone] [facility] [place] ([place], [place]): [address], [place] [address], Ph [phone], Fax [phone]   Vitals Ht: 5 ft 11 in  Wt: 309 lbs  BMI: 43.1  BP: 134/84 sitting R arm  Pulse: 84 bpm    Allergies Reviewed Allergies HYDROCODONE: Itching  OXYCODONE: Itching    Medications Reviewed Medications amlodipine 5 mg-olmesartan 40 mg tablet TAKE 1 TABLET EVERY MORNING 02/12/17   filled surescripts aspirin 81 mg tablet 06/28/17   entered Rhyanna Sorce, PA-C atorvastatin 40 mg tablet 1 TABLET ONCE A DAY BY MOUTH 02/17/17   filled surescripts diclofenac sodium 75 mg tablet,delayed release TAKE 1 TABLET BY MOUTH TWICE A DAY 02/18/17   filled surescripts escitalopram 10 mg tablet TAKE 1 TABLET BY MOUTH EVERY DAY 03/09/17   filled surescripts Ferrex 150 mg iron capsule TAKE 1 CAPSULE BY MOUTH TWICE A DAY 03/09/17   filled surescripts Pennsaid 20 mg/gram/actuation (2 %) topical soln in metered-dose pump APPLY 2 PUMPS (40 MG) TO THE AFFECTED AREA BY TOPICAL ROUTE 2 TIMES PER DAY 05/27/17   prescribed Gaynelle Arabian, MD RABEprazole 20 mg tablet,delayed release TAKE 1 TABLET BY MOUTH TWICE A DAY 04/22/17   filled OPTUMRX Spiriva Respimat 1.25 mcg/actuation solution for inhalation 06/28/17   entered Vanessa Cumine triamcinolone acetonide 0.1 % topical cream 1 APPLICATION TO AFFECTED AREA TWICE A DAY, SPARRINGLY AS NEEDED FOR RASH EXTERNALLY 30 DAYS 02/18/17   filled surescripts Trulicity 1.5 YK/5.9 mL  subcutaneous pen injector 0.5 ML ONCE A WEEK ON SATURDAYS SUBCUTANEOUS 04/22/17   filled OPTUMRX   Problems Reviewed Problems Lumbar spondylosis - Onset: 05/05/2017 Degeneration of lumbar intervertebral disc - Onset: 04/26/2017 Low back pain - Onset: 04/26/2017 Osteoarthritis of right knee joint - Onset: 03/24/2017 History of left total knee replacement - Onset: 06/23/2015 Acquired trigger finger of right ring finger - Onset: 04/20/2017 Lumbosacral spondylolisthesis - Onset: 05/23/2017   Family History Reviewed Family History Mother - Rheumatoid arthritis   - Hypertensive disorder   - Congestive heart failure Unspecified Relation - Diabetes mellitus   Social History Reviewed Social History Smoking Status: Former smoker Tobacco-years of use: 30 Chewing tobacco: none Alcohol intake: None Hand Dominance: Right Work related injury?: N Advance directive: N Freight forwarder of Attorney: N   Surgical History Reviewed Surgical History Tonsillectomy Sinus surgery procedure Nasal septoplasty Ankle Surgery - Right Ankle Partial hysterectomy Reduction mammoplasty - Bilateral Foot/Ankle Surgery - Bilateral Knee Arthroscopy - Left Knee Carpal Tunnel Release - Bilateral Knee Joint Replacement - Left Knee - 2016   GYN History Most Recent Mammogram: 04/25/2017.   Past Medical History Reviewed Past Medical History Anemia: Y - Past History Anxiety Disorder: Y Asthma: Y Diabetes: Y GERD/Reflux: Y High Cholesterol: Y Hypertension: Y Joint Pain: Y Osteoporosis: Y Rheumatoid Arthritis: Y Sleep apnea: Y Swelling of Legs/Feet/Hands: Y    HPI The patient is here today for a pre-operative History and Physical. They are scheduled for right total knee replacement on 07/25/2017 with Dr. Wynelle Link at [facility]. The patient is a 62 year old female who has  been followed for their knee. The patient is known to have right knee pain and osteoarthritis. They are now month(s)  out from cortisone injection. Symptoms reported include: pain, aching, popping, grinding, difficulty ambulating and difficulty arising from chair. The patient feels that they are doing poorly. Current treatment includes: bracing. The patient has reported only temporary improvement of their symptoms with Cortisone injections. Her right knee is giving her increasing problems. She had a cortisone shot over three months ago and it did not provided a tremendous amount of benefit and only for a short time. She said the knee is definitely limiting what she can and cannot do. Her left total knee is doing extremely well and she is very pleased with that a little bit over a year out. We discussed treatment options including repeat injection, which is not worth it because she did not get any benefit from the last ones. She does not want to do anymore injections. We discussed pursuing total knee arthroplasty. She has reached a point where she would like to proceed with surgery at this time.   ROS Constitutional: Constitutional: no fever, chills, night sweats, or significant weight loss.  Cardiovascular: Cardiovascular: no palpitations or chest pain.  Respiratory: Respiratory: no cough or shortness of breath and No COPD; occasional asthma flare.  Gastrointestinal: Gastrointestinal: no vomiting or nausea.  Musculoskeletal: Musculoskeletal: no swelling in Joints and Joint Pain.  Neurologic: Neurologic: no numbness, tingling, or difficulty with balance.   Physical Exam Patient is a 62 year old female.  General Mental Status - Alert, cooperative and good historian. General Appearance - pleasant, Not in acute distress. Orientation - Oriented X3. Build & Nutrition - Well nourished and Well developed, Overweight, Obese  Head and Neck Head - normocephalic, atraumatic . Neck Global Assessment - supple, no bruit auscultated on the right, no bruit auscultated on the left.  Eye Wears glasses Pupil -  Bilateral - PERR Motion - Bilateral - EOMI.  Chest and Lung Exam Auscultation Breath sounds - clear at anterior chest wall and clear at posterior chest wall except for some slight inspiratory wheeze in the right lower and mid posterior lung field.   Cardiovascular Auscultation Rhythm - Regular rate and rhythm. Heart Sounds - S1 WNL and S2 WNL. Murmurs & Other Heart Sounds - Auscultation of the heart reveals - No Murmurs.  Abdomen Palpation/Percussion Tenderness - Abdomen is non-tender to palpation. Abdomen is soft. Auscultation Auscultation of the abdomen reveals - Bowel sounds normal. Protuberant abdomen  Genitourinary Note: Not done, not pertinent to present illness  Musculoskeletal Left knee looks excellent. Range about 0 to 115 with no tenderness or instability. Right knee shows moderate crepitus on range of motion, range 5 to 110. She is tender medial greater than lateral with no instability.   Assessment / Plan 1. Osteoarthritis of right knee joint M17.11: Unilateral primary osteoarthritis, right knee  2. History of left total knee replacement Z96.652: Presence of left artificial knee joint  Goals Patient Instructions Surgical Plans: Right Total Knee Replacement Disposition: Home, Straight to Outpatient Therapy PCP: Dr. Alyson Ingles - seen and clear to proceed with surgery as stated by patient at time of H&P IV TXA Anesthesia Issues: Nausea Patient was instructed on what medications to stop prior to surgery. - Follow up visit in 2 weeks with Dr. Wynelle Link - Begin physical therapy following surgery at [facility] - orders were sent to setup first therapy session. Provider to call patient to setup first PT EVAL on Friday 07/29/17 - Dr. Wynelle Link -  Right Total Knee Arthroplasty - Pre-operative lab work as pre Pre-Surgical Testing - Prescriptions will be provided in hospital at time of discharge  Anticipated LOS equal to or greater than 2 midnights due to - Age 68 and older with  one or more of the following:  - Obesity  - Expected need for hospital services (PT, OT, Nursing) required for safe  discharge  - Anticipated need for postoperative skilled nursing care or inpatient rehab  - Active co-morbidities:   Asthma:  Diabetes:  GERD/Reflux:  High Cholesterol:  Hypertension:  Joint Pain:  Osteoporosis:  Rheumatoid Arthritis:  Sleep apnea:  Swelling of Legs/Feet/Hands:   Return to Office Sallee Lange, DPT for Physical Therapy Evaluation at [facility] on 07/29/2017 at 09:00 AM Gaynelle Arabian, MD for 5-Post-Op at [facility] on 08/09/2017 at 01:15 PM  Encounter signed-off by Mickel Crow, PA-C

## 2017-07-12 DIAGNOSIS — M47816 Spondylosis without myelopathy or radiculopathy, lumbar region: Secondary | ICD-10-CM | POA: Diagnosis not present

## 2017-07-13 NOTE — Patient Instructions (Addendum)
Alexandria Ruiz  07/13/2017   Your procedure is scheduled on: 07-25-17  Report to [facility] Main  Entrance             Report to admitting at       0900 AM    Call this number if you have problems the morning of surgery [phone]   Remember: Do not eat food or drink liquids :After Midnight.     Take these medicines the morning of surgery with A SIP OF WATER: lexapro, aciphex, inhaler and bring DO NOT TAKE ANY DIABETIC MEDICATIONS DAY OF YOUR SURGERY                               You may not have any metal on your body including hair pins and              piercings  Do not wear jewelry, make-up, lotions, powders or perfumes, deodorant             Do not wear nail polish.  Do not shave  48 hours prior to surgery.     Do not bring valuables to the hospital. [place].  Contacts, dentures or bridgework may not be worn into surgery.  Leave suitcase in the car. After surgery it may be brought to your room.               Please read over the following fact sheets you were given: _____________________________________________________________________          [facility] - Preparing for Surgery Before surgery, you can play an important role.  Because skin is not sterile, your skin needs to be as free of germs as possible.  You can reduce the number of germs on your skin by washing with CHG (chlorahexidine gluconate) soap before surgery.  CHG is an antiseptic cleaner which kills germs and bonds with the skin to continue killing germs even after washing. Please DO NOT use if you have an allergy to CHG or antibacterial soaps.  If your skin becomes reddened/irritated stop using the CHG and inform your nurse when you arrive at Short Stay. Do not shave (including legs and underarms) for at least 48 hours prior to the first CHG shower.  You may shave your face/neck. Please follow these instructions carefully:  1.  Shower  with CHG Soap the night before surgery and the  morning of Surgery.  2.  If you choose to wash your hair, wash your hair first as usual with your  normal  shampoo.  3.  After you shampoo, rinse your hair and body thoroughly to remove the  shampoo.                           4.  Use CHG as you would any other liquid soap.  You can apply chg directly  to the skin and wash                       Gently with a scrungie or clean washcloth.  5.  Apply the CHG Soap to your body ONLY FROM THE NECK DOWN.   Do not use on face/ open  Wound or open sores. Avoid contact with eyes, ears mouth and genitals (private parts).                       Wash face,  Genitals (private parts) with your normal soap.             6.  Wash thoroughly, paying special attention to the area where your surgery  will be performed.  7.  Thoroughly rinse your body with warm water from the neck down.  8.  DO NOT shower/wash with your normal soap after using and rinsing off  the CHG Soap.                9.  Pat yourself dry with a clean towel.            10.  Wear clean pajamas.            11.  Place clean sheets on your bed the night of your first shower and do not  sleep with pets. Day of Surgery : Do not apply any lotions/deodorants the morning of surgery.  Please wear clean clothes to the hospital/surgery center.  FAILURE TO FOLLOW THESE INSTRUCTIONS MAY RESULT IN THE CANCELLATION OF YOUR SURGERY PATIENT SIGNATURE_________________________________  NURSE SIGNATURE__________________________________  ________________________________________________________________________  WHAT IS A BLOOD TRANSFUSION? Blood Transfusion Information  A transfusion is the replacement of blood or some of its parts. Blood is made up of multiple cells which provide different functions.  Red blood cells carry oxygen and are used for blood loss replacement.  White blood cells fight against infection.  Platelets control  bleeding.  Plasma helps clot blood.  Other blood products are available for specialized needs, such as hemophilia or other clotting disorders. BEFORE THE TRANSFUSION  Who gives blood for transfusions?   Healthy volunteers who are fully evaluated to make sure their blood is safe. This is blood bank blood. Transfusion therapy is the safest it has ever been in the practice of medicine. Before blood is taken from a donor, a complete history is taken to make sure that person has no history of diseases nor engages in risky social behavior (examples are intravenous drug use or sexual activity with multiple partners). The donor's travel history is screened to minimize risk of transmitting infections, such as malaria. The donated blood is tested for signs of infectious diseases, such as HIV and hepatitis. The blood is then tested to be sure it is compatible with you in order to minimize the chance of a transfusion reaction. If you or a relative donates blood, this is often done in anticipation of surgery and is not appropriate for emergency situations. It takes many days to process the donated blood. RISKS AND COMPLICATIONS Although transfusion therapy is very safe and saves many lives, the main dangers of transfusion include:   Getting an infectious disease.  Developing a transfusion reaction. This is an allergic reaction to something in the blood you were given. Every precaution is taken to prevent this. The decision to have a blood transfusion has been considered carefully by your caregiver before blood is given. Blood is not given unless the benefits outweigh the risks. AFTER THE TRANSFUSION  Right after receiving a blood transfusion, you will usually feel much better and more energetic. This is especially true if your red blood cells have gotten low (anemic). The transfusion raises the level of the red blood cells which carry oxygen, and this usually causes an energy increase.  The  nurse  administering the transfusion will monitor you carefully for complications. HOME CARE INSTRUCTIONS  No special instructions are needed after a transfusion. You may find your energy is better. Speak with your caregiver about any limitations on activity for underlying diseases you may have. SEEK MEDICAL CARE IF:   Your condition is not improving after your transfusion.  You develop redness or irritation at the intravenous (IV) site. SEEK IMMEDIATE MEDICAL CARE IF:  Any of the following symptoms occur over the next 12 hours:  Shaking chills.  You have a temperature by mouth above 102 F (38.9 C), not controlled by medicine.  Chest, back, or muscle pain.  People around you feel you are not acting correctly or are confused.  Shortness of breath or difficulty breathing.  Dizziness and fainting.  You get a rash or develop hives.  You have a decrease in urine output.  Your urine turns a dark color or changes to pink, red, or brown. Any of the following symptoms occur over the next 10 days:  You have a temperature by mouth above 102 F (38.9 C), not controlled by medicine.  Shortness of breath.  Weakness after normal activity.  The white part of the eye turns yellow (jaundice).  You have a decrease in the amount of urine or are urinating less often.  Your urine turns a dark color or changes to pink, red, or brown. Document Released: 02/27/2000 Document Revised: 05/24/2011 Document Reviewed: 10/16/2007 ExitCare Patient Information 2014 [place].  _______________________________________________________________________  Incentive Spirometer  An incentive spirometer is a tool that can help keep your lungs clear and active. This tool measures how well you are filling your lungs with each breath. Taking long deep breaths may help reverse or decrease the chance of developing breathing (pulmonary) problems (especially infection) following:  A long period of time when you are  unable to move or be active. BEFORE THE PROCEDURE   If the spirometer includes an indicator to show your best effort, your nurse or respiratory therapist will set it to a desired goal.  If possible, sit up straight or lean slightly forward. Try not to slouch.  Hold the incentive spirometer in an upright position. INSTRUCTIONS FOR USE  1. Sit on the edge of your bed if possible, or sit up as far as you can in bed or on a chair. 2. Hold the incentive spirometer in an upright position. 3. Breathe out normally. 4. Place the mouthpiece in your mouth and seal your lips tightly around it. 5. Breathe in slowly and as deeply as possible, raising the piston or the ball toward the top of the column. 6. Hold your breath for 3-5 seconds or for as long as possible. Allow the piston or ball to fall to the bottom of the column. 7. Remove the mouthpiece from your mouth and breathe out normally. 8. Rest for a few seconds and repeat Steps 1 through 7 at least 10 times every 1-2 hours when you are awake. Take your time and take a few normal breaths between deep breaths. 9. The spirometer may include an indicator to show your best effort. Use the indicator as a goal to work toward during each repetition. 10. After each set of 10 deep breaths, practice coughing to be sure your lungs are clear. If you have an incision (the cut made at the time of surgery), support your incision when coughing by placing a pillow or rolled up towels firmly against it. Once you are able to get  out of bed, walk around indoors and cough well. You may stop using the incentive spirometer when instructed by your caregiver.  RISKS AND COMPLICATIONS  Take your time so you do not get dizzy or light-headed.  If you are in pain, you may need to take or ask for pain medication before doing incentive spirometry. It is harder to take a deep breath if you are having pain. AFTER USE  Rest and breathe slowly and easily.  It can be helpful to  keep track of a log of your progress. Your caregiver can provide you with a simple table to help with this. If you are using the spirometer at home, follow these instructions: [place] IF:   You are having difficultly using the spirometer.  You have trouble using the spirometer as often as instructed.  Your pain medication is not giving enough relief while using the spirometer.  You develop fever of 100.5 F (38.1 C) or higher. SEEK IMMEDIATE MEDICAL CARE IF:   You cough up bloody sputum that had not been present before.  You develop fever of 102 F (38.9 C) or greater.  You develop worsening pain at or near the incision site. MAKE SURE YOU:   Understand these instructions.  Will watch your condition.  Will get help right away if you are not doing well or get worse. Document Released: 07/12/2006 Document Revised: 05/24/2011 Document Reviewed: 09/12/2006 [facility] Patient Information 2014 [place], [place].   ________________________________________________________________________

## 2017-07-15 ENCOUNTER — Encounter (HOSPITAL_COMMUNITY)
Admission: RE | Admit: 2017-07-15 | Discharge: 2017-07-15 | Disposition: A | Discharge status: Home / Self Care | Payer: 59 | Source: Ambulatory Visit | Attending: Orthopedic Surgery | Admitting: Orthopedic Surgery

## 2017-07-15 ENCOUNTER — Other Ambulatory Visit: Payer: Self-pay

## 2017-07-15 ENCOUNTER — Encounter (HOSPITAL_COMMUNITY): Payer: Self-pay

## 2017-07-15 DIAGNOSIS — Z01810 Encounter for preprocedural cardiovascular examination: Secondary | ICD-10-CM | POA: Insufficient documentation

## 2017-07-15 DIAGNOSIS — M1711 Unilateral primary osteoarthritis, right knee: Secondary | ICD-10-CM | POA: Diagnosis not present

## 2017-07-15 DIAGNOSIS — Z01818 Encounter for other preprocedural examination: Secondary | ICD-10-CM | POA: Diagnosis not present

## 2017-07-15 LAB — CBC
HCT: 36.8 % (ref 36.0–46.0)
Hemoglobin: 11.5 g/dL — ABNORMAL LOW (ref 12.0–15.0)
MCH: 28.8 pg (ref 26.0–34.0)
MCHC: 31.3 g/dL (ref 30.0–36.0)
MCV: 92 fL (ref 78.0–100.0)
PLATELETS: 310 10*3/uL (ref 150–400)
RBC: 4 MIL/uL (ref 3.87–5.11)
RDW: 15.5 % (ref 11.5–15.5)
WBC: 8.1 10*3/uL (ref 4.0–10.5)

## 2017-07-15 LAB — COMPREHENSIVE METABOLIC PANEL
ALT: 17 U/L (ref 14–54)
AST: 21 U/L (ref 15–41)
Albumin: 3.7 g/dL (ref 3.5–5.0)
Alkaline Phosphatase: 71 U/L (ref 38–126)
Anion gap: 9 (ref 5–15)
BUN: 21 mg/dL — ABNORMAL HIGH (ref 6–20)
CHLORIDE: 108 mmol/L (ref 101–111)
CO2: 24 mmol/L (ref 22–32)
Calcium: 9.1 mg/dL (ref 8.9–10.3)
Creatinine, Ser: 0.82 mg/dL (ref 0.44–1.00)
Glucose, Bld: 91 mg/dL (ref 65–99)
POTASSIUM: 3.7 mmol/L (ref 3.5–5.1)
Sodium: 141 mmol/L (ref 135–145)
Total Bilirubin: 0.8 mg/dL (ref 0.3–1.2)
Total Protein: 7.1 g/dL (ref 6.5–8.1)

## 2017-07-15 LAB — SURGICAL PCR SCREEN
MRSA, PCR: INVALID — AB
Staphylococcus aureus: INVALID — AB

## 2017-07-15 LAB — APTT: aPTT: 31 seconds (ref 24–36)

## 2017-07-15 LAB — PROTIME-INR
INR: 0.97
PROTHROMBIN TIME: 12.8 s (ref 11.4–15.2)

## 2017-07-15 LAB — GLUCOSE, CAPILLARY: Glucose-Capillary: 103 mg/dL — ABNORMAL HIGH (ref 65–99)

## 2017-07-15 NOTE — Progress Notes (Signed)
EKG shown to Dr. Valma Cava anesthesia . No new orders.

## 2017-07-15 NOTE — Progress Notes (Signed)
Clearance 11-30-17 char tDr. Alyson Ingles

## 2017-07-17 LAB — MRSA CULTURE: Culture: NOT DETECTED

## 2017-07-24 MED ORDER — TRANEXAMIC ACID 1000 MG/10ML IV SOLN
1000.0000 mg | INTRAVENOUS | Status: AC
  Administered 2017-07-25: 1000 mg via INTRAVENOUS
  Filled 2017-07-24: qty 1100

## 2017-07-24 MED ORDER — BUPIVACAINE LIPOSOME 1.3 % IJ SUSP
20.0000 mL | INTRAMUSCULAR | Status: DC
  Filled 2017-07-24: qty 20

## 2017-07-24 MED ORDER — DEXTROSE 5 % IV SOLN
3.0000 g | INTRAVENOUS | Status: AC
  Administered 2017-07-25: 3 g via INTRAVENOUS
  Filled 2017-07-24: qty 3

## 2017-07-25 ENCOUNTER — Encounter (HOSPITAL_COMMUNITY)
Admission: RE | Disposition: A | Discharge status: Home / Self Care | Payer: Self-pay | Source: Home / Self Care | Attending: Orthopedic Surgery

## 2017-07-25 ENCOUNTER — Inpatient Hospital Stay (HOSPITAL_COMMUNITY)
Admission: RE | Admit: 2017-07-25 | Discharge: 2017-07-27 | DRG: 470 | Disposition: A | Discharge status: Home / Self Care | Payer: 59 | Attending: Orthopedic Surgery | Admitting: Orthopedic Surgery

## 2017-07-25 ENCOUNTER — Encounter (HOSPITAL_COMMUNITY): Payer: Self-pay | Admitting: *Deleted

## 2017-07-25 ENCOUNTER — Other Ambulatory Visit: Payer: Self-pay

## 2017-07-25 ENCOUNTER — Inpatient Hospital Stay (HOSPITAL_COMMUNITY): Payer: 59 | Admitting: Anesthesiology

## 2017-07-25 DIAGNOSIS — Z8249 Family history of ischemic heart disease and other diseases of the circulatory system: Secondary | ICD-10-CM

## 2017-07-25 DIAGNOSIS — Z833 Family history of diabetes mellitus: Secondary | ICD-10-CM

## 2017-07-25 DIAGNOSIS — G4730 Sleep apnea, unspecified: Secondary | ICD-10-CM | POA: Diagnosis present

## 2017-07-25 DIAGNOSIS — K219 Gastro-esophageal reflux disease without esophagitis: Secondary | ICD-10-CM | POA: Diagnosis present

## 2017-07-25 DIAGNOSIS — M1710 Unilateral primary osteoarthritis, unspecified knee: Secondary | ICD-10-CM | POA: Diagnosis present

## 2017-07-25 DIAGNOSIS — G8918 Other acute postprocedural pain: Secondary | ICD-10-CM | POA: Diagnosis not present

## 2017-07-25 DIAGNOSIS — M069 Rheumatoid arthritis, unspecified: Secondary | ICD-10-CM | POA: Diagnosis present

## 2017-07-25 DIAGNOSIS — M25761 Osteophyte, right knee: Secondary | ICD-10-CM | POA: Diagnosis present

## 2017-07-25 DIAGNOSIS — J45909 Unspecified asthma, uncomplicated: Secondary | ICD-10-CM | POA: Diagnosis present

## 2017-07-25 DIAGNOSIS — M810 Age-related osteoporosis without current pathological fracture: Secondary | ICD-10-CM | POA: Diagnosis present

## 2017-07-25 DIAGNOSIS — I10 Essential (primary) hypertension: Secondary | ICD-10-CM | POA: Diagnosis present

## 2017-07-25 DIAGNOSIS — Z885 Allergy status to narcotic agent status: Secondary | ICD-10-CM

## 2017-07-25 DIAGNOSIS — Z96652 Presence of left artificial knee joint: Secondary | ICD-10-CM | POA: Diagnosis present

## 2017-07-25 DIAGNOSIS — Z7982 Long term (current) use of aspirin: Secondary | ICD-10-CM

## 2017-07-25 DIAGNOSIS — F419 Anxiety disorder, unspecified: Secondary | ICD-10-CM | POA: Diagnosis present

## 2017-07-25 DIAGNOSIS — M25561 Pain in right knee: Secondary | ICD-10-CM | POA: Diagnosis not present

## 2017-07-25 DIAGNOSIS — E785 Hyperlipidemia, unspecified: Secondary | ICD-10-CM | POA: Diagnosis present

## 2017-07-25 DIAGNOSIS — Z90711 Acquired absence of uterus with remaining cervical stump: Secondary | ICD-10-CM | POA: Diagnosis not present

## 2017-07-25 DIAGNOSIS — M179 Osteoarthritis of knee, unspecified: Secondary | ICD-10-CM | POA: Diagnosis present

## 2017-07-25 DIAGNOSIS — E1142 Type 2 diabetes mellitus with diabetic polyneuropathy: Secondary | ICD-10-CM | POA: Diagnosis present

## 2017-07-25 DIAGNOSIS — Z79899 Other long term (current) drug therapy: Secondary | ICD-10-CM | POA: Diagnosis not present

## 2017-07-25 DIAGNOSIS — M1711 Unilateral primary osteoarthritis, right knee: Principal | ICD-10-CM | POA: Diagnosis present

## 2017-07-25 DIAGNOSIS — Z87891 Personal history of nicotine dependence: Secondary | ICD-10-CM | POA: Diagnosis not present

## 2017-07-25 DIAGNOSIS — Z6841 Body Mass Index (BMI) 40.0 and over, adult: Secondary | ICD-10-CM

## 2017-07-25 HISTORY — PX: TOTAL KNEE ARTHROPLASTY: SHX125

## 2017-07-25 LAB — GLUCOSE, CAPILLARY
GLUCOSE-CAPILLARY: 92 mg/dL (ref 65–99)
GLUCOSE-CAPILLARY: 245 mg/dL — AB (ref 65–99)
GLUCOSE-CAPILLARY: 192 mg/dL — AB (ref 65–99)

## 2017-07-25 LAB — TYPE AND SCREEN
ABO/RH(D): O POS
ANTIBODY SCREEN: NEGATIVE

## 2017-07-25 SURGERY — ARTHROPLASTY, KNEE, TOTAL
Anesthesia: Monitor Anesthesia Care | Site: Knee | Laterality: Right

## 2017-07-25 MED ORDER — CEFAZOLIN SODIUM-DEXTROSE 2-4 GM/100ML-% IV SOLN
2.0000 g | Freq: Four times a day (QID) | INTRAVENOUS | Status: AC
  Administered 2017-07-25 (×2): 2 g via INTRAVENOUS
  Filled 2017-07-25 (×2): qty 100

## 2017-07-25 MED ORDER — DIPHENHYDRAMINE HCL 12.5 MG/5ML PO ELIX: 12.5000 mg | ORAL_SOLUTION | ORAL | Status: DC | PRN

## 2017-07-25 MED ORDER — INSULIN ASPART 100 UNIT/ML ~~LOC~~ SOLN
0.0000 [IU] | Freq: Three times a day (TID) | SUBCUTANEOUS | Status: DC
  Administered 2017-07-25: 5 [IU] via SUBCUTANEOUS
  Administered 2017-07-26 (×2): 3 [IU] via SUBCUTANEOUS
  Administered 2017-07-26: 5 [IU] via SUBCUTANEOUS
  Administered 2017-07-27: 2 [IU] via SUBCUTANEOUS

## 2017-07-25 MED ORDER — BUPIVACAINE IN DEXTROSE 0.75-8.25 % IT SOLN
INTRATHECAL | Status: DC | PRN
  Administered 2017-07-25: 2 mL via INTRATHECAL

## 2017-07-25 MED ORDER — GABAPENTIN 300 MG PO CAPS
300.0000 mg | ORAL_CAPSULE | Freq: Once | ORAL | Status: AC
  Administered 2017-07-25: 300 mg via ORAL
  Filled 2017-07-25: qty 1

## 2017-07-25 MED ORDER — BISACODYL 10 MG RE SUPP: 10.0000 mg | Freq: Every day | RECTAL | Status: DC | PRN

## 2017-07-25 MED ORDER — LIDOCAINE 2% (20 MG/ML) 5 ML SYRINGE
INTRAMUSCULAR | Status: AC
  Filled 2017-07-25: qty 5

## 2017-07-25 MED ORDER — BUPIVACAINE LIPOSOME 1.3 % IJ SUSP
INTRAMUSCULAR | Status: DC | PRN
  Administered 2017-07-25: 20 mL

## 2017-07-25 MED ORDER — SODIUM CHLORIDE 0.9 % IJ SOLN
INTRAMUSCULAR | Status: AC
  Filled 2017-07-25: qty 50

## 2017-07-25 MED ORDER — LORATADINE 10 MG PO TABS
10.0000 mg | ORAL_TABLET | Freq: Every evening | ORAL | Status: DC
  Administered 2017-07-25 – 2017-07-26 (×2): 10 mg via ORAL
  Filled 2017-07-25 (×2): qty 1

## 2017-07-25 MED ORDER — AMLODIPINE-OLMESARTAN 5-40 MG PO TABS: 1.0000 | ORAL_TABLET | Freq: Every day | ORAL | Status: DC

## 2017-07-25 MED ORDER — PROPOFOL 10 MG/ML IV BOLUS
INTRAVENOUS | Status: AC
  Filled 2017-07-25: qty 20

## 2017-07-25 MED ORDER — FENTANYL CITRATE (PF) 100 MCG/2ML IJ SOLN
INTRAMUSCULAR | Status: AC
  Filled 2017-07-25: qty 2

## 2017-07-25 MED ORDER — STERILE WATER FOR IRRIGATION IR SOLN
Status: DC | PRN
  Administered 2017-07-25: 2000 mL

## 2017-07-25 MED ORDER — 0.9 % SODIUM CHLORIDE (POUR BTL) OPTIME
TOPICAL | Status: DC | PRN
  Administered 2017-07-25: 1000 mL

## 2017-07-25 MED ORDER — LACTATED RINGERS IV SOLN
INTRAVENOUS | Status: DC
  Administered 2017-07-25 (×2): via INTRAVENOUS

## 2017-07-25 MED ORDER — GABAPENTIN 300 MG PO CAPS
300.0000 mg | ORAL_CAPSULE | Freq: Three times a day (TID) | ORAL | Status: DC
  Administered 2017-07-25 – 2017-07-27 (×6): 300 mg via ORAL
  Filled 2017-07-25 (×6): qty 1

## 2017-07-25 MED ORDER — PANTOPRAZOLE SODIUM 40 MG PO TBEC
40.0000 mg | DELAYED_RELEASE_TABLET | Freq: Two times a day (BID) | ORAL | Status: DC
  Administered 2017-07-25 – 2017-07-27 (×4): 40 mg via ORAL
  Filled 2017-07-25 (×4): qty 1

## 2017-07-25 MED ORDER — LISDEXAMFETAMINE DIMESYLATE 50 MG PO CAPS
70.0000 mg | ORAL_CAPSULE | Freq: Every day | ORAL | Status: DC
  Filled 2017-07-25 (×2): qty 1

## 2017-07-25 MED ORDER — ALBUTEROL SULFATE (2.5 MG/3ML) 0.083% IN NEBU: 3.0000 mL | INHALATION_SOLUTION | Freq: Four times a day (QID) | RESPIRATORY_TRACT | Status: DC | PRN

## 2017-07-25 MED ORDER — ONDANSETRON HCL 4 MG/2ML IJ SOLN
4.0000 mg | Freq: Four times a day (QID) | INTRAMUSCULAR | Status: DC | PRN
  Administered 2017-07-27: 4 mg via INTRAVENOUS
  Filled 2017-07-25: qty 2

## 2017-07-25 MED ORDER — ESCITALOPRAM OXALATE 10 MG PO TABS
10.0000 mg | ORAL_TABLET | Freq: Every morning | ORAL | Status: DC
  Administered 2017-07-26 – 2017-07-27 (×2): 10 mg via ORAL
  Filled 2017-07-25 (×2): qty 1

## 2017-07-25 MED ORDER — ROPIVACAINE HCL 7.5 MG/ML IJ SOLN
INTRAMUSCULAR | Status: DC | PRN
  Administered 2017-07-25: 20 mL via PERINEURAL

## 2017-07-25 MED ORDER — PHENOL 1.4 % MT LIQD
1.0000 | OROMUCOSAL | Status: DC | PRN
  Filled 2017-07-25: qty 177

## 2017-07-25 MED ORDER — HYDROXYZINE HCL 25 MG PO TABS: 25.0000 mg | ORAL_TABLET | Freq: Three times a day (TID) | ORAL | Status: DC | PRN

## 2017-07-25 MED ORDER — TRAMADOL HCL 50 MG PO TABS: 50.0000 mg | ORAL_TABLET | Freq: Four times a day (QID) | ORAL | 0 refills | Status: DC | PRN

## 2017-07-25 MED ORDER — METOCLOPRAMIDE HCL 5 MG/ML IJ SOLN: 5.0000 mg | Freq: Three times a day (TID) | INTRAMUSCULAR | Status: DC | PRN

## 2017-07-25 MED ORDER — ACETAMINOPHEN 10 MG/ML IV SOLN
1000.0000 mg | Freq: Once | INTRAVENOUS | Status: AC
  Administered 2017-07-25: 1000 mg via INTRAVENOUS
  Filled 2017-07-25: qty 100

## 2017-07-25 MED ORDER — AMLODIPINE BESYLATE 5 MG PO TABS
5.0000 mg | ORAL_TABLET | Freq: Every day | ORAL | Status: DC
  Administered 2017-07-26 – 2017-07-27 (×2): 5 mg via ORAL
  Filled 2017-07-25 (×2): qty 1

## 2017-07-25 MED ORDER — PHENYLEPHRINE HCL 10 MG/ML IJ SOLN
INTRAMUSCULAR | Status: AC
  Filled 2017-07-25: qty 1

## 2017-07-25 MED ORDER — TRANEXAMIC ACID 1000 MG/10ML IV SOLN
1000.0000 mg | Freq: Once | INTRAVENOUS | Status: AC
  Administered 2017-07-25: 1000 mg via INTRAVENOUS
  Filled 2017-07-25: qty 1100

## 2017-07-25 MED ORDER — SODIUM CHLORIDE 0.9 % IV SOLN
INTRAVENOUS | Status: DC
  Administered 2017-07-25: 16:00:00 via INTRAVENOUS

## 2017-07-25 MED ORDER — DOCUSATE SODIUM 100 MG PO CAPS
100.0000 mg | ORAL_CAPSULE | Freq: Two times a day (BID) | ORAL | Status: DC
  Administered 2017-07-25 – 2017-07-27 (×4): 100 mg via ORAL
  Filled 2017-07-25 (×4): qty 1

## 2017-07-25 MED ORDER — FENTANYL CITRATE (PF) 100 MCG/2ML IJ SOLN: 25.0000 ug | INTRAMUSCULAR | Status: DC | PRN

## 2017-07-25 MED ORDER — MIDAZOLAM HCL 2 MG/2ML IJ SOLN
1.0000 mg | INTRAMUSCULAR | Status: DC
  Administered 2017-07-25: 2 mg via INTRAVENOUS
  Filled 2017-07-25: qty 2

## 2017-07-25 MED ORDER — HYDROMORPHONE HCL 1 MG/ML IJ SOLN
0.5000 mg | INTRAMUSCULAR | Status: DC | PRN
  Administered 2017-07-25 – 2017-07-26 (×3): 1 mg via INTRAVENOUS
  Filled 2017-07-25 (×3): qty 1

## 2017-07-25 MED ORDER — PROPOFOL 500 MG/50ML IV EMUL
INTRAVENOUS | Status: DC | PRN
  Administered 2017-07-25: 125 ug/kg/min via INTRAVENOUS

## 2017-07-25 MED ORDER — GABAPENTIN 300 MG PO CAPS: 300.0000 mg | ORAL_CAPSULE | Freq: Three times a day (TID) | ORAL | 0 refills | Status: DC

## 2017-07-25 MED ORDER — LISDEXAMFETAMINE DIMESYLATE 70 MG PO CAPS: 70.0000 mg | ORAL_CAPSULE | Freq: Every day | ORAL | Status: DC

## 2017-07-25 MED ORDER — FENTANYL CITRATE (PF) 100 MCG/2ML IJ SOLN
50.0000 ug | INTRAMUSCULAR | Status: DC
  Administered 2017-07-25: 100 ug via INTRAVENOUS
  Filled 2017-07-25: qty 2

## 2017-07-25 MED ORDER — OLMESARTAN MEDOXOMIL 40 MG PO TABS
40.0000 mg | ORAL_TABLET | Freq: Every day | ORAL | Status: DC
  Administered 2017-07-26 – 2017-07-27 (×2): 40 mg via ORAL
  Filled 2017-07-25 (×2): qty 1

## 2017-07-25 MED ORDER — METHOCARBAMOL 500 MG PO TABS: 500.0000 mg | ORAL_TABLET | Freq: Four times a day (QID) | ORAL | 0 refills | Status: DC | PRN

## 2017-07-25 MED ORDER — ONDANSETRON HCL 4 MG PO TABS: 4.0000 mg | ORAL_TABLET | Freq: Four times a day (QID) | ORAL | Status: DC | PRN

## 2017-07-25 MED ORDER — PROPOFOL 10 MG/ML IV BOLUS
INTRAVENOUS | Status: AC
  Filled 2017-07-25: qty 40

## 2017-07-25 MED ORDER — DEXTROSE 5 % IV SOLN
500.0000 mg | Freq: Four times a day (QID) | INTRAVENOUS | Status: DC | PRN
  Administered 2017-07-25: 500 mg via INTRAVENOUS
  Filled 2017-07-25: qty 550

## 2017-07-25 MED ORDER — VALACYCLOVIR HCL 500 MG PO TABS
1000.0000 mg | ORAL_TABLET | Freq: Every day | ORAL | Status: DC
  Administered 2017-07-26 – 2017-07-27 (×2): 1000 mg via ORAL
  Filled 2017-07-25 (×2): qty 2

## 2017-07-25 MED ORDER — ONDANSETRON HCL 4 MG/2ML IJ SOLN
INTRAMUSCULAR | Status: DC | PRN
  Administered 2017-07-25: 4 mg via INTRAVENOUS

## 2017-07-25 MED ORDER — METHOCARBAMOL 500 MG PO TABS
500.0000 mg | ORAL_TABLET | Freq: Four times a day (QID) | ORAL | Status: DC | PRN
  Administered 2017-07-26 (×3): 500 mg via ORAL
  Filled 2017-07-25 (×4): qty 1

## 2017-07-25 MED ORDER — ASPIRIN EC 325 MG PO TBEC
325.0000 mg | DELAYED_RELEASE_TABLET | Freq: Two times a day (BID) | ORAL | Status: DC
  Administered 2017-07-26 – 2017-07-27 (×3): 325 mg via ORAL
  Filled 2017-07-25 (×3): qty 1

## 2017-07-25 MED ORDER — POLYSACCHARIDE IRON COMPLEX 150 MG PO CAPS
150.0000 mg | ORAL_CAPSULE | Freq: Two times a day (BID) | ORAL | Status: DC
  Administered 2017-07-25 – 2017-07-27 (×4): 150 mg via ORAL
  Filled 2017-07-25 (×4): qty 1

## 2017-07-25 MED ORDER — ASPIRIN 325 MG PO TBEC: 325.0000 mg | DELAYED_RELEASE_TABLET | Freq: Two times a day (BID) | ORAL | 0 refills | Status: DC

## 2017-07-25 MED ORDER — PHENYLEPHRINE 40 MCG/ML (10ML) SYRINGE FOR IV PUSH (FOR BLOOD PRESSURE SUPPORT)
PREFILLED_SYRINGE | INTRAVENOUS | Status: AC
  Filled 2017-07-25: qty 10

## 2017-07-25 MED ORDER — METOCLOPRAMIDE HCL 5 MG PO TABS: 5.0000 mg | ORAL_TABLET | Freq: Three times a day (TID) | ORAL | Status: DC | PRN

## 2017-07-25 MED ORDER — ATORVASTATIN CALCIUM 40 MG PO TABS
40.0000 mg | ORAL_TABLET | Freq: Every day | ORAL | Status: DC
  Administered 2017-07-26 – 2017-07-27 (×2): 40 mg via ORAL
  Filled 2017-07-25 (×2): qty 1

## 2017-07-25 MED ORDER — PHENYLEPHRINE 40 MCG/ML (10ML) SYRINGE FOR IV PUSH (FOR BLOOD PRESSURE SUPPORT)
PREFILLED_SYRINGE | INTRAVENOUS | Status: DC | PRN
  Administered 2017-07-25: 80 ug via INTRAVENOUS

## 2017-07-25 MED ORDER — HYDROMORPHONE HCL 2 MG PO TABS: 2.0000 mg | ORAL_TABLET | Freq: Four times a day (QID) | ORAL | 0 refills | Status: DC | PRN

## 2017-07-25 MED ORDER — HYDROMORPHONE HCL 2 MG PO TABS
2.0000 mg | ORAL_TABLET | ORAL | Status: DC | PRN
  Administered 2017-07-25: 2 mg via ORAL
  Administered 2017-07-25 – 2017-07-26 (×2): 4 mg via ORAL
  Filled 2017-07-25: qty 2
  Filled 2017-07-25: qty 1
  Filled 2017-07-25: qty 2

## 2017-07-25 MED ORDER — PROPOFOL 10 MG/ML IV BOLUS
INTRAVENOUS | Status: DC | PRN
  Administered 2017-07-25 (×2): 10 mg via INTRAVENOUS

## 2017-07-25 MED ORDER — DEXAMETHASONE SODIUM PHOSPHATE 10 MG/ML IJ SOLN
10.0000 mg | Freq: Once | INTRAMUSCULAR | Status: AC
  Administered 2017-07-26: 10 mg via INTRAVENOUS
  Filled 2017-07-25: qty 1

## 2017-07-25 MED ORDER — SODIUM CHLORIDE 0.9 % IJ SOLN
INTRAMUSCULAR | Status: AC
  Filled 2017-07-25: qty 10

## 2017-07-25 MED ORDER — ZOLPIDEM TARTRATE 5 MG PO TABS: 5.0000 mg | ORAL_TABLET | Freq: Every evening | ORAL | Status: DC | PRN

## 2017-07-25 MED ORDER — SODIUM CHLORIDE 0.9 % IV SOLN
INTRAVENOUS | Status: DC | PRN
  Administered 2017-07-25: 25 ug/min via INTRAVENOUS

## 2017-07-25 MED ORDER — SODIUM CHLORIDE 0.9 % IJ SOLN
INTRAMUSCULAR | Status: DC | PRN
  Administered 2017-07-25: 60 mL

## 2017-07-25 MED ORDER — ACETAMINOPHEN 500 MG PO TABS
1000.0000 mg | ORAL_TABLET | Freq: Four times a day (QID) | ORAL | Status: AC
  Administered 2017-07-25 – 2017-07-26 (×4): 1000 mg via ORAL
  Filled 2017-07-25 (×4): qty 2

## 2017-07-25 MED ORDER — MENTHOL 3 MG MT LOZG: 1.0000 | LOZENGE | OROMUCOSAL | Status: DC | PRN

## 2017-07-25 MED ORDER — POLYETHYLENE GLYCOL 3350 17 G PO PACK: 17.0000 g | PACK | Freq: Every day | ORAL | Status: DC | PRN

## 2017-07-25 MED ORDER — DEXAMETHASONE SODIUM PHOSPHATE 10 MG/ML IJ SOLN
10.0000 mg | Freq: Once | INTRAMUSCULAR | Status: AC
  Administered 2017-07-25: 10 mg via INTRAVENOUS

## 2017-07-25 MED ORDER — SODIUM CHLORIDE 0.9 % IR SOLN
Status: DC | PRN
  Administered 2017-07-25: 1000 mL

## 2017-07-25 MED ORDER — ONDANSETRON HCL 4 MG/2ML IJ SOLN
INTRAMUSCULAR | Status: AC
  Filled 2017-07-25: qty 2

## 2017-07-25 MED ORDER — CHLORHEXIDINE GLUCONATE 4 % EX LIQD: 60.0000 mL | Freq: Once | CUTANEOUS | Status: DC

## 2017-07-25 MED ORDER — TRAMADOL HCL 50 MG PO TABS
50.0000 mg | ORAL_TABLET | Freq: Four times a day (QID) | ORAL | Status: DC | PRN
  Administered 2017-07-26 (×2): 100 mg via ORAL
  Filled 2017-07-25 (×2): qty 2

## 2017-07-25 MED ORDER — FLEET ENEMA 7-19 GM/118ML RE ENEM: 1.0000 | ENEMA | Freq: Once | RECTAL | Status: DC | PRN

## 2017-07-25 SURGICAL SUPPLY — 44 items
BAG ZIPLOCK 12X15 (MISCELLANEOUS) ×2 IMPLANT
BANDAGE ACE 6X5 VEL STRL LF (GAUZE/BANDAGES/DRESSINGS) ×2 IMPLANT
BLADE SAG 18X100X1.27 (BLADE) ×2 IMPLANT
BLADE SAW SGTL 11.0X1.19X90.0M (BLADE) ×2 IMPLANT
BOWL SMART MIX CTS (DISPOSABLE) ×2 IMPLANT
CAPT KNEE TOTAL 3 ATTUNE ×2 IMPLANT
CEMENT HV SMART SET (Cement) ×4 IMPLANT
COVER SURGICAL LIGHT HANDLE (MISCELLANEOUS) ×2 IMPLANT
CUFF TOURN SGL QUICK 44 (TOURNIQUET CUFF) ×2 IMPLANT
DECANTER SPIKE VIAL GLASS SM (MISCELLANEOUS) ×2 IMPLANT
DRAPE U-SHAPE 47X51 STRL (DRAPES) ×2 IMPLANT
DRSG ADAPTIC 3X8 NADH LF (GAUZE/BANDAGES/DRESSINGS) ×2 IMPLANT
DURAPREP 26ML APPLICATOR (WOUND CARE) ×2 IMPLANT
ELECT REM PT RETURN 15FT ADLT (MISCELLANEOUS) ×2 IMPLANT
EVACUATOR 1/8 PVC DRAIN (DRAIN) ×2 IMPLANT
GAUZE SPONGE 4X4 12PLY STRL (GAUZE/BANDAGES/DRESSINGS) ×2 IMPLANT
GLOVE BIO SURGEON STRL SZ8 (GLOVE) ×4 IMPLANT
GLOVE BIOGEL PI IND STRL 6.5 (GLOVE) ×1 IMPLANT
GLOVE BIOGEL PI IND STRL 8 (GLOVE) ×1 IMPLANT
GLOVE BIOGEL PI INDICATOR 6.5 (GLOVE) ×1
GLOVE BIOGEL PI INDICATOR 8 (GLOVE) ×1
GLOVE SURG SS PI 6.5 STRL IVOR (GLOVE) ×2 IMPLANT
GOWN SPEC L3 XXLG W/TWL (GOWN DISPOSABLE) ×2 IMPLANT
GOWN STRL REUS W/TWL LRG LVL3 (GOWN DISPOSABLE) ×4 IMPLANT
GOWN STRL REUS W/TWL XL LVL3 (GOWN DISPOSABLE) ×4 IMPLANT
HANDPIECE INTERPULSE COAX TIP (DISPOSABLE) ×1
HOLDER FOLEY CATH W/STRAP (MISCELLANEOUS) ×2 IMPLANT
IMMOBILIZER KNEE 20 (SOFTGOODS) ×2
IMMOBILIZER KNEE 20 THIGH 36 (SOFTGOODS) ×1 IMPLANT
MANIFOLD NEPTUNE II (INSTRUMENTS) ×2 IMPLANT
PACK TOTAL KNEE CUSTOM (KITS) ×2 IMPLANT
PAD ABD 8X10 STRL (GAUZE/BANDAGES/DRESSINGS) ×2 IMPLANT
PADDING CAST COTTON 6X4 STRL (CAST SUPPLIES) ×6 IMPLANT
POSITIONER SURGICAL ARM (MISCELLANEOUS) ×2 IMPLANT
SET HNDPC FAN SPRY TIP SCT (DISPOSABLE) ×1 IMPLANT
STRIP CLOSURE SKIN 1/2X4 (GAUZE/BANDAGES/DRESSINGS) ×4 IMPLANT
SUT MNCRL AB 4-0 PS2 18 (SUTURE) ×2 IMPLANT
SUT STRATAFIX 0 PDS 27 VIOLET (SUTURE) ×2
SUT VIC AB 2-0 CT1 27 (SUTURE) ×3
SUT VIC AB 2-0 CT1 TAPERPNT 27 (SUTURE) ×3 IMPLANT
SUTURE STRATFX 0 PDS 27 VIOLET (SUTURE) ×1 IMPLANT
TRAY FOLEY CATH 14FR (SET/KITS/TRAYS/PACK) ×2 IMPLANT
WRAP KNEE MAXI GEL POST OP (GAUZE/BANDAGES/DRESSINGS) ×2 IMPLANT
YANKAUER SUCT BULB TIP 10FT TU (MISCELLANEOUS) ×2 IMPLANT

## 2017-07-25 NOTE — Transfer of Care (Signed)
Immediate Anesthesia Transfer of Care Note  Patient: Alexandria Ruiz  Procedure(s) Performed: RIGHT TOTAL KNEE ARTHROPLASTY (Right Knee)  Patient Location: PACU  Anesthesia Type:Regional and Spinal  Level of Consciousness: awake, alert  and oriented  Airway & Oxygen Therapy: Patient Spontanous Breathing and Patient connected to face mask oxygen  Post-op Assessment: Report given to RN and Post -op Vital signs reviewed and stable  Post vital signs: Reviewed and stable  Last Vitals:  Vitals Value Taken Time  BP 122/66 07/25/2017  1:10 PM  Temp    Pulse 86 07/25/2017  1:14 PM  Resp 26 07/25/2017  1:14 PM  SpO2 92 % 07/25/2017  1:14 PM  Vitals shown include unvalidated device data.  Last Pain:  Vitals:   07/25/17 0954  TempSrc: Oral  PainSc:          Complications: No apparent anesthesia complications

## 2017-07-25 NOTE — Anesthesia Preprocedure Evaluation (Signed)
Anesthesia Evaluation  Patient identified by MRN, date of birth, ID band Patient awake    Reviewed: Allergy & Precautions, H&P , NPO status , Patient's Chart, lab work & pertinent test results  History of Anesthesia Complications (+) PONV and history of anesthetic complications  Airway Mallampati: II  TM Distance: >3 FB Neck ROM: full    Dental  (+) Caps, Dental Advisory Given,    Pulmonary sleep apnea and Continuous Positive Airway Pressure Ventilation , former smoker,    breath sounds clear to auscultation       Cardiovascular Exercise Tolerance: Good hypertension, Pt. on medications Normal cardiovascular exam Rhythm:regular Rate:Normal     Neuro/Psych  Neuromuscular disease negative psych ROS   GI/Hepatic Neg liver ROS, GERD  ,  Endo/Other  diabetes, Well Controlled, Type 2, Oral Hypoglycemic AgentsMorbid obesity  Renal/GU negative Renal ROS     Musculoskeletal  (+) Arthritis ,   Abdominal   Peds  Hematology negative hematology ROS (+)   Anesthesia Other Findings   Reproductive/Obstetrics                             Anesthesia Physical Anesthesia Plan  ASA: III  Anesthesia Plan: MAC, Regional and Spinal   Post-op Pain Management:    Induction:   PONV Risk Score and Plan: 3 and Treatment may vary due to age or medical condition and Propofol infusion  Airway Management Planned: Nasal Cannula  Additional Equipment: None  Intra-op Plan:   Post-operative Plan:   Informed Consent: I have reviewed the patients History and Physical, chart, labs and discussed the procedure including the risks, benefits and alternatives for the proposed anesthesia with the patient or authorized representative who has indicated his/her understanding and acceptance.   Dental advisory given  Plan Discussed with: Surgeon  Anesthesia Plan Comments:         Anesthesia Quick Evaluation

## 2017-07-25 NOTE — Anesthesia Procedure Notes (Signed)
Spinal  Patient location during procedure: OR Start time: 07/25/2017 11:41 AM End time: 07/25/2017 11:43 AM Staffing Anesthesiologist: Oleta Mouse, MD Preanesthetic Checklist Completed: patient identified, surgical consent, pre-op evaluation, timeout performed, IV checked, risks and benefits discussed and monitors and equipment checked Spinal Block Patient position: sitting Prep: site prepped and draped and DuraPrep Patient monitoring: heart rate, cardiac monitor, continuous pulse ox and blood pressure Approach: midline Location: L3-4 Injection technique: single-shot Needle Needle type: Pencan  Needle gauge: 24 G Needle length: 10 cm Assessment Sensory level: T6

## 2017-07-25 NOTE — Discharge Instructions (Signed)
° °Dr. Frank Aluisio °Total Joint Specialist °[facility] °[address] °[place], [place] [address] °[phone] ° °TOTAL KNEE REPLACEMENT POSTOPERATIVE DIRECTIONS ° °Knee Rehabilitation, Guidelines Following Surgery  °Results after knee surgery are often greatly improved when you follow the exercise, range of motion and muscle strengthening exercises prescribed by your doctor. Safety measures are also important to protect the knee from further injury. Any time any of these exercises cause you to have increased pain or swelling in your knee joint, decrease the amount until you are comfortable again and slowly increase them. If you have problems or questions, call your caregiver or physical therapist for advice.  ° °HOME CARE INSTRUCTIONS  °Remove items at home which could result in a fall. This includes throw rugs or furniture in walking pathways.  °· ICE to the affected knee every three hours for 30 minutes at a time and then as needed for pain and swelling.  Continue to use ice on the knee for pain and swelling from surgery. You may notice swelling that will progress down to the foot and ankle.  This is normal after surgery.  Elevate the leg when you are not up walking on it.   °· Continue to use the breathing machine which will help keep your temperature down.  It is common for your temperature to cycle up and down following surgery, especially at night when you are not up moving around and exerting yourself.  The breathing machine keeps your lungs expanded and your temperature down. °· Do not place pillow under knee, focus on keeping the knee straight while resting ° °DIET °You may resume your previous home diet once your are discharged from the hospital. ° °DRESSING / WOUND CARE / SHOWERING °You may change your dressing every day with sterile gauze.  Please use good hand washing techniques before changing the dressing.  Do not use any lotions or creams on the incision until instructed by your  surgeon. °You may start showering once you are discharged home but do not submerge the incision under water. Just pat the incision dry and apply a dry gauze dressing on daily. °Change the surgical dressing daily and reapply a dry dressing each time. ° °ACTIVITY °Walk with your walker as instructed. °Use walker as long as suggested by your caregivers. °Avoid periods of inactivity such as sitting longer than an hour when not asleep. This helps prevent blood clots.  °You may resume a sexual relationship in one month or when given the OK by your doctor.  °You may return to work once you are cleared by your doctor.  °Do not drive a car for 6 weeks or until released by you surgeon.  °Do not drive while taking narcotics. ° °WEIGHT BEARING °Weight bearing as tolerated with assist device (walker, cane, etc) as directed, use it as long as suggested by your surgeon or therapist, typically at least 4-6 weeks. ° °POSTOPERATIVE CONSTIPATION PROTOCOL °Constipation - defined medically as fewer than three stools per week and severe constipation as less than one stool per week. ° °One of the most common issues patients have following surgery is constipation.  Even if you have a regular bowel pattern at home, your normal regimen is likely to be disrupted due to multiple reasons following surgery.  Combination of anesthesia, postoperative narcotics, change in appetite and fluid intake all can affect your bowels.  In order to avoid complications following surgery, here are some recommendations in order to help you during your recovery period. ° °  Colace (docusate) - Pick up an over-the-counter form of Colace or another stool softener and take twice a day as long as you are requiring postoperative pain medications.  Take with a full glass of water daily.  If you experience loose stools or diarrhea, hold the colace until you stool forms back up.  If your symptoms do not get better within 1 week or if they get worse, check with your  doctor. ° °Dulcolax (bisacodyl) - Pick up over-the-counter and take as directed by the product packaging as needed to assist with the movement of your bowels.  Take with a full glass of water.  Use this product as needed if not relieved by Colace only.  ° °MiraLax (polyethylene glycol) - Pick up over-the-counter to have on hand.  MiraLax is a solution that will increase the amount of water in your bowels to assist with bowel movements.  Take as directed and can mix with a glass of water, juice, soda, coffee, or tea.  Take if you go more than two days without a movement. °Do not use MiraLax more than once per day. Call your doctor if you are still constipated or irregular after using this medication for 7 days in a row. ° °If you continue to have problems with postoperative constipation, please contact the office for further assistance and recommendations.  If you experience "the worst abdominal pain ever" or develop nausea or vomiting, please contact the office immediatly for further recommendations for treatment. ° °ITCHING ° If you experience itching with your medications, try taking only a single pain pill, or even half a pain pill at a time.  You can also use Benadryl over the counter for itching or also to help with sleep.  ° °TED HOSE STOCKINGS °Wear the elastic stockings on both legs for three weeks following surgery during the day but you may remove then at night for sleeping. ° °MEDICATIONS °See your medication summary on the “After Visit Summary” that the nursing staff will review with you prior to discharge.  You may have some home medications which will be placed on hold until you complete the course of blood thinner medication.  It is important for you to complete the blood thinner medication as prescribed by your surgeon.  Continue your approved medications as instructed at time of discharge. ° °PRECAUTIONS °If you experience chest pain or shortness of breath - call 911 immediately for transfer to the  hospital emergency department.  °If you develop a fever greater that 101 F, purulent drainage from wound, increased redness or drainage from wound, foul odor from the wound/dressing, or calf pain - CONTACT YOUR SURGEON.   °                                                °FOLLOW-UP APPOINTMENTS °Make sure you keep all of your appointments after your operation with your surgeon and caregivers. You should call the office at the above phone number and make an appointment for approximately two weeks after the date of your surgery or on the date instructed by your surgeon outlined in the "After Visit Summary". ° ° °RANGE OF MOTION AND STRENGTHENING EXERCISES  °Rehabilitation of the knee is important following a knee injury or an operation. After just a few days of immobilization, the muscles of the thigh which control the knee become weakened and   shrink (atrophy). Knee exercises are designed to build up the tone and strength of the thigh muscles and to improve knee motion. Often times heat used for twenty to thirty minutes before working out will loosen up your tissues and help with improving the range of motion but do not use heat for the first two weeks following surgery. These exercises can be done on a training (exercise) mat, on the floor, on a table or on a bed. Use what ever works the best and is most comfortable for you Knee exercises include:  °Leg Lifts - While your knee is still immobilized in a splint or cast, you can do straight leg raises. Lift the leg to 60 degrees, hold for 3 sec, and slowly lower the leg. Repeat 10-20 times 2-3 times daily. Perform this exercise against resistance later as your knee gets better.  °Quad and Hamstring Sets - Tighten up the muscle on the front of the thigh (Quad) and hold for 5-10 sec. Repeat this 10-20 times hourly. Hamstring sets are done by pushing the foot backward against an object and holding for 5-10 sec. Repeat as with quad sets.  °· Leg Slides: Lying on your back,  slowly slide your foot toward your buttocks, bending your knee up off the floor (only go as far as is comfortable). Then slowly slide your foot back down until your leg is flat on the floor again. °· Angel Wings: Lying on your back spread your legs to the side as far apart as you can without causing discomfort.  °A rehabilitation program following serious knee injuries can speed recovery and prevent re-injury in the future due to weakened muscles. Contact your doctor or a physical therapist for more information on knee rehabilitation.  ° °IF YOU ARE TRANSFERRED TO A SKILLED REHAB FACILITY °If the patient is transferred to a skilled rehab facility following release from the hospital, a list of the current medications will be sent to the facility for the patient to continue.  When discharged from the skilled rehab facility, please have the facility set up the patient's Home Health Physical Therapy prior to being released. Also, the skilled facility will be responsible for providing the patient with their medications at time of release from the facility to include their pain medication, the muscle relaxants, and their blood thinner medication. If the patient is still at the rehab facility at time of the two week follow up appointment, the skilled rehab facility will also need to assist the patient in arranging follow up appointment in our office and any transportation needs. ° °MAKE SURE YOU:  °Understand these instructions.  °Get help right away if you are not doing well or get worse.  ° ° °Pick up stool softner and laxative for home use following surgery while on pain medications. °Do not submerge incision under water. °Please use good hand washing techniques while changing dressing each day. °May shower starting three days after surgery. °Please use a clean towel to pat the incision dry following showers. °Continue to use ice for pain and swelling after surgery. °Do not use any lotions or creams on the incision  until instructed by your surgeon. °

## 2017-07-25 NOTE — Progress Notes (Signed)
Pt offered cpap for use tonight but she refused.  Pt stated she prefers her mask and tubing from home, and that her husband will be bringing it in tomorrow.  Pt was advised that we have tubing/masks available but she declined.  Pt was advised that RT is available all night should she change her mind.  RN aware.

## 2017-07-25 NOTE — Anesthesia Postprocedure Evaluation (Signed)
Anesthesia Post Note  Patient: Alexandria Ruiz  Procedure(s) Performed: RIGHT TOTAL KNEE ARTHROPLASTY (Right Knee)     Patient location during evaluation: PACU Anesthesia Type: Regional, MAC and Spinal Level of consciousness: awake and alert Pain management: pain level controlled Vital Signs Assessment: post-procedure vital signs reviewed and stable Respiratory status: spontaneous breathing, nonlabored ventilation, respiratory function stable and patient connected to nasal cannula oxygen Cardiovascular status: blood pressure returned to baseline and stable Postop Assessment: no apparent nausea or vomiting and spinal receding Anesthetic complications: no    Last Vitals:  Vitals:   07/25/17 1415 07/25/17 1442  BP:  (!) 124/109  Pulse:  92  Resp:  18  Temp: 36.7 C 36.9 C  SpO2: 92% 93%    Last Pain:  Vitals:   07/25/17 1442  TempSrc: Oral  PainSc:                  Tamotsu Wiederholt

## 2017-07-25 NOTE — Anesthesia Procedure Notes (Signed)
Anesthesia Regional Block: Adductor canal block   Pre-Anesthetic Checklist: ,, timeout performed, Correct Patient, Correct Site, Correct Laterality, Correct Procedure, Correct Position, site marked, Risks and benefits discussed,  Surgical consent,  Pre-op evaluation,  At surgeon's request and post-op pain management  Laterality: Lower and Right  Prep: chloraprep       Needles:  Injection technique: Single-shot  Needle Type: Echogenic Stimulator Needle          Additional Needles:   Procedures:,,,, ultrasound used (permanent image in chart),,,,  Narrative:  Start time: 07/25/2017 11:14 AM End time: 07/25/2017 11:17 AM Injection made incrementally with aspirations every 5 mL.  Performed by: Personally  Anesthesiologist: Oleta Mouse, MD  Additional Notes: H+P and labs reviewed, risks and benefits discussed with patient, procedure tolerated well without complications

## 2017-07-25 NOTE — Interval H&P Note (Signed)
History and Physical Interval Note:  07/25/2017 9:24 AM  Alexandria Ruiz  has presented today for surgery, with the diagnosis of Osteoarthritis Right Knee  The various methods of treatment have been discussed with the patient and family. After consideration of risks, benefits and other options for treatment, the patient has consented to  Procedure(s): RIGHT TOTAL KNEE ARTHROPLASTY (Right) as a surgical intervention .  The patient's history has been reviewed, patient examined, no change in status, stable for surgery.  I have reviewed the patient's chart and labs.  Questions were answered to the patient's satisfaction.     Pilar Plate Renatha Rosen

## 2017-07-25 NOTE — Progress Notes (Signed)
AssistedDr. Moser with right, ultrasound guided, adductor canal block. Side rails up, monitors on throughout procedure. See vital signs in flow sheet. Tolerated Procedure well.  

## 2017-07-25 NOTE — Op Note (Signed)
OPERATIVE REPORT-TOTAL KNEE ARTHROPLASTY   Pre-operative diagnosis- Osteoarthritis  Right knee(s)  Post-operative diagnosis- Osteoarthritis Right knee(s)  Procedure-  Right  Total Knee Arthroplasty (Depuy Attune)  Surgeon- Alexandria Plover. Sheng Pritz, MD  Assistant- Ardeen Jourdain, PA-C   Anesthesia-  Adductor canal block and spinal  EBL-25 ml\   Drains Hemovac  Tourniquet time-  Total Tourniquet Time Documented: Thigh (Right) - 40 minutes Total: Thigh (Right) - 40 minutes     Complications- None  Condition-PACU - hemodynamically stable.   Brief Clinical Note  Alexandria Ruiz is a 62 y.o. year old female with end stage OA of her right knee with progressively worsening pain and dysfunction. She has constant pain, with activity and at rest and significant functional deficits with difficulties even with ADLs. She has had extensive non-op management including analgesics, injections of cortisone and viscosupplements, and home exercise program, but remains in significant pain with significant dysfunction.Radiographs show bone on bone arthritis bone on bone lateral and patellofemoral. She presents now for right Total Knee Arthroplasty.    Procedure in detail---   The patient is brought into the operating room and positioned supine on the operating table. After successful administration of  Adductor canal block and spinal,   a tourniquet is placed high on the  Right thigh(s) and the lower extremity is prepped and draped in the usual sterile fashion. Time out is performed by the operating team and then the  Right lower extremity is wrapped in Esmarch, knee flexed and the tourniquet inflated to 300 mmHg.       A midline incision is made with a ten blade through the subcutaneous tissue to the level of the extensor mechanism. A fresh blade is used to make a medial parapatellar arthrotomy. Soft tissue over the proximal medial tibia is subperiosteally elevated to the joint line with a knife and into  the semimembranosus bursa with a Cobb elevator. Soft tissue over the proximal lateral tibia is elevated with attention being paid to avoiding the patellar tendon on the tibial tubercle. The patella is everted, knee flexed 90 degrees and the ACL and PCL are removed. Findings are bone on bone lateral and patellofemoral with large global osteophytes.        The drill is used to create a starting hole in the distal femur and the canal is thoroughly irrigated with sterile saline to remove the fatty contents. The 5 degree Right  valgus alignment guide is placed into the femoral canal and the distal femoral cutting block is pinned to remove 9 mm off the distal femur. Resection is made with an oscillating saw.      The tibia is subluxed forward and the menisci are removed. The extramedullary alignment guide is placed referencing proximally at the medial aspect of the tibial tubercle and distally along the second metatarsal axis and tibial crest. The block is pinned to remove 37mm off the more deficient medial  side. Resection is made with an oscillating saw. Size 5is the most appropriate size for the tibia and the proximal tibia is prepared with the modular drill and keel punch for that size.      The femoral sizing guide is placed and size 5 is most appropriate. Rotation is marked off the epicondylar axis and confirmed by creating a rectangular flexion gap at 90 degrees. The size 5 cutting block is pinned in this rotation and the anterior, posterior and chamfer cuts are made with the oscillating saw. The intercondylar block is then placed and that  cut is made.      Trial size 5 tibial component, trial size 5 posterior stabilized femur and a 10  mm posterior stabilized rotating platform insert trial is placed. Full extension is achieved with excellent varus/valgus and anterior/posterior balance throughout full range of motion. The patella is everted and thickness measured to be 22  mm. Free hand resection is taken to 12  mm, a 35 template is placed, lug holes are drilled, trial patella is placed, and it tracks normally. Osteophytes are removed off the posterior femur with the trial in place. All trials are removed and the cut bone surfaces prepared with pulsatile lavage. Cement is mixed and once ready for implantation, the size 5 tibial implant, size  5 posterior stabilized femoral component, and the size 35 patella are cemented in place and the patella is held with the clamp. The trial insert is placed and the knee held in full extension. The Exparel (20 ml mixed with 60 ml saline) is injected into the extensor mechanism, posterior capsule, medial and lateral gutters and subcutaneous tissues.  All extruded cement is removed and once the cement is hard the permanent 10 mm posterior stabilized rotating platform insert is placed into the tibial tray.      The wound is copiously irrigated with saline solution and the extensor mechanism closed over a hemovac drain with #1 V-loc suture. The tourniquet is released for a total tourniquet time of 40  minutes. Flexion against gravity is 140 degrees and the patella tracks normally. Subcutaneous tissue is closed with 2.0 vicryl and subcuticular with running 4.0 Monocryl. The incision is cleaned and dried and steri-strips and a bulky sterile dressing are applied. The limb is placed into a knee immobilizer and the patient is awakened and transported to recovery in stable condition.      Please note that a surgical assistant was a medical necessity for this procedure in order to perform it in a safe and expeditious manner. Surgical assistant was necessary to retract the ligaments and vital neurovascular structures to prevent injury to them and also necessary for proper positioning of the limb to allow for anatomic placement of the prosthesis.   Alexandria Plover Eren Ryser, MD    07/25/2017, 12:48 PM

## 2017-07-26 LAB — GLUCOSE, CAPILLARY
GLUCOSE-CAPILLARY: 175 mg/dL — AB (ref 65–99)
GLUCOSE-CAPILLARY: 150 mg/dL — AB (ref 65–99)
Glucose-Capillary: 221 mg/dL — ABNORMAL HIGH (ref 65–99)
Glucose-Capillary: 177 mg/dL — ABNORMAL HIGH (ref 65–99)

## 2017-07-26 LAB — CBC
HEMATOCRIT: 33.5 % — AB (ref 36.0–46.0)
Hemoglobin: 10.4 g/dL — ABNORMAL LOW (ref 12.0–15.0)
MCH: 28.7 pg (ref 26.0–34.0)
MCHC: 31 g/dL (ref 30.0–36.0)
MCV: 92.3 fL (ref 78.0–100.0)
PLATELETS: 267 10*3/uL (ref 150–400)
RBC: 3.63 MIL/uL — AB (ref 3.87–5.11)
RDW: 14.7 % (ref 11.5–15.5)
WBC: 10.7 10*3/uL — ABNORMAL HIGH (ref 4.0–10.5)

## 2017-07-26 LAB — BASIC METABOLIC PANEL
ANION GAP: 12 (ref 5–15)
BUN: 10 mg/dL (ref 6–20)
CO2: 23 mmol/L (ref 22–32)
Calcium: 8.8 mg/dL — ABNORMAL LOW (ref 8.9–10.3)
Chloride: 104 mmol/L (ref 101–111)
Creatinine, Ser: 0.83 mg/dL (ref 0.44–1.00)
GFR calc Af Amer: >60 mL/min (ref >60)
GFR calc non Af Amer: >60 mL/min (ref >60)
GLUCOSE: 198 mg/dL — AB (ref 65–99)
POTASSIUM: 4.4 mmol/L (ref 3.5–5.1)
Sodium: 139 mmol/L (ref 135–145)

## 2017-07-26 MED ORDER — HYDROMORPHONE HCL 2 MG PO TABS: 2.0000 mg | ORAL_TABLET | ORAL | Status: DC | PRN

## 2017-07-26 MED ORDER — HYDROMORPHONE HCL 2 MG PO TABS
4.0000 mg | ORAL_TABLET | ORAL | Status: DC | PRN
  Administered 2017-07-26 (×5): 6 mg via ORAL
  Filled 2017-07-26 (×5): qty 3

## 2017-07-26 MED ORDER — HYDROXYZINE HCL 25 MG PO TABS
25.0000 mg | ORAL_TABLET | Freq: Three times a day (TID) | ORAL | Status: DC | PRN
  Administered 2017-07-26 – 2017-07-27 (×2): 25 mg via ORAL
  Filled 2017-07-26 (×2): qty 1

## 2017-07-26 MED ORDER — OXYCODONE HCL 5 MG PO TABS
10.0000 mg | ORAL_TABLET | Freq: Four times a day (QID) | ORAL | Status: DC | PRN
  Administered 2017-07-26 – 2017-07-27 (×3): 10 mg via ORAL
  Filled 2017-07-26 (×3): qty 2

## 2017-07-26 MED ORDER — DIPHENHYDRAMINE HCL 12.5 MG/5ML PO ELIX: 12.5000 mg | ORAL_SOLUTION | ORAL | Status: DC | PRN

## 2017-07-26 NOTE — Plan of Care (Signed)
Reviewed plan of care, specifically safety measures, pain control, and importance of notifying RN with any questions or concerns. Pt attentive and verbalized understanding.  

## 2017-07-26 NOTE — Progress Notes (Signed)
Spoke with patient at bedside. Confirmed plan for OP PT, already arranged. Need a RW, contacted [facility] to deliver to the room. [phone]

## 2017-07-26 NOTE — Progress Notes (Signed)
   Subjective: 1 Day Post-Op Procedure(s) (LRB): RIGHT TOTAL KNEE ARTHROPLASTY (Right) Patient reports pain as mild.   Patient seen in rounds with Dr. Wynelle Link. Patient is well, and has had no acute complaints or problems. No issues overnight. Was not able to get up with therapy yesterday.  We will start therapy today.  Plan is to go Home after hospital stay.  Objective: Vital signs in last 24 hours: Temp:  [98 F (36.7 C)-99.2 F (37.3 C)] 98.7 F (37.1 C) (05/14 0510) Pulse Rate:  [84-103] 97 (05/14 0510) Resp:  [13-30] 15 (05/13 1600) BP: (122-156)/(66-109) 142/80 (05/14 0510) SpO2:  [89 %-100 %] 98 % (05/14 0510) Weight:  [140.6 kg (310 lb)] 140.6 kg (310 lb) (05/13 0947)  Intake/Output from previous day:  Intake/Output Summary (Last 24 hours) at 07/26/2017 0724 Last data filed at 07/26/2017 0510 Gross per 24 hour  Intake 3172.5 ml  Output 1870 ml  Net 1302.5 ml     Labs: Recent Labs    07/26/17 0522  HGB 10.4*   Recent Labs    07/26/17 0522  WBC 10.7*  RBC 3.63*  HCT 33.5*  PLT 267   Recent Labs    07/26/17 0522  NA 139  K 4.4  CL 104  CO2 23  BUN 10  CREATININE 0.83  GLUCOSE 198*  CALCIUM 8.8*    EXAM General - Patient is Alert and Oriented Extremity - Neurologically intact Intact pulses distally Dorsiflexion/Plantar flexion intact Compartment soft Dressing - dressing C/D/I Motor Function - intact, moving foot and toes well on exam.  Hemovac pulled without difficulty.  Past Medical History:  Diagnosis Date  . Abdominal pain   . Allergic rhinitis   . Anemia   . Anxiety   . Arthritis   . Asthma   . Bronchitis   . Diabetes mellitus    type 2  . GERD (gastroesophageal reflux disease)   . Hyperlipidemia   . Hypertension   . Obesity   . Peripheral neuropathy    denies has carpal tunnel  . PONV (postoperative nausea and vomiting)   . Shortness of breath dyspnea    since starting Trulicity diabetic med  . Sleep apnea    uses c-pap  machine    Assessment/Plan: 1 Day Post-Op Procedure(s) (LRB): RIGHT TOTAL KNEE ARTHROPLASTY (Right) Active Problems:   OA (osteoarthritis) of knee  Estimated body mass index is 43.24 kg/m as calculated from the following:   Height as of this encounter: 5\' 11"  (1.803 m).   Weight as of this encounter: 140.6 kg (310 lb). Advance diet Up with therapy D/C IV fluids when tolerating POs well  DVT Prophylaxis - Aspirin Weight-Bearing as tolerated  D/C O2 and Pulse OX and try on Room Air  Anticipated LOS equal to or greater than 2 midnights due to - Age 60 and older with one or more of the following:  - Obesity  - Expected need for hospital services (PT, OT, Nursing) required for safe discharge  - Anticipated need for postoperative skilled nursing care or inpatient rehab  - Active co-morbidities: Diabetes OR   - Unanticipated findings during/Post Surgery: None  - Patient is a high risk of re-admission due to: None   Will have her get up with therapy today. Plan for DC home tomorrow with outpatient therapy.   Ardeen Jourdain, PA-C Orthopaedic Surgery 07/26/2017, 7:24 AM

## 2017-07-26 NOTE — Evaluation (Signed)
Physical Therapy Evaluation Patient Details Name: Alexandria Ruiz MRN: [id number] DOB: January 26, 1956 Today's Date: 07/26/2017   History of Present Illness  Pt is a 62 year old female s/p R TKA with hx of obesity, neuropathy, DM, and L TKA  Clinical Impression  Pt is s/p TKA resulting in the deficits listed below (see PT Problem List).  Pt will benefit from skilled PT to increase their independence and safety with mobility to allow discharge to the venue listed below.  Pt assisted with ambulating in hallway POD #1 and plans to d/c home with spouse assist.  Pt reports plan for OP PT follow up.     Follow Up Recommendations Follow surgeon's recommendation for DC plan and follow-up therapies(plans for OP PT)    Equipment Recommendations  Rolling walker with 5" wheels    Recommendations for Other Services       Precautions / Restrictions Precautions Precautions: Knee Required Braces or Orthoses: Knee Immobilizer - Right Restrictions Weight Bearing Restrictions: No Other Position/Activity Restrictions: WBAT      Mobility  Bed Mobility Overal bed mobility: Needs Assistance Bed Mobility: Supine to Sit     Supine to sit: Min assist     General bed mobility comments: assist for R LE  Transfers Overall transfer level: Needs assistance Equipment used: Rolling walker (2 wheeled) Transfers: Sit to/from Stand Sit to Stand: Min guard         General transfer comment: verbal cues for UE and LE positioning, increased time and effort  Ambulation/Gait Ambulation/Gait assistance: Min guard Ambulation Distance (Feet): 40 Feet Assistive device: Rolling walker (2 wheeled) Gait Pattern/deviations: Step-to pattern;Decreased stance time - right;Antalgic     General Gait Details: verbal cues for sequence, RW positioning, step length  Stairs            Wheelchair Mobility    Modified Rankin (Stroke Patients Only)       Balance                                              Pertinent Vitals/Pain Pain Assessment: 0-10 Pain Score: 4  Pain Location: R knee Pain Descriptors / Indicators: Aching;Sore Pain Intervention(s): Limited activity within patient's tolerance;Repositioned;Monitored during session;Ice applied;RN gave pain meds during session    [place] expects to be discharged to:: Private residence Living Arrangements: Spouse/significant other   Type of Home: House Home Access: Stairs to enter Entrance Stairs-Rails: Can reach both Entrance Stairs-Number of Steps: 4 Home Layout: One level Home Equipment: Walker - 4 wheels      Prior Function Level of Independence: Independent               Hand Dominance        Extremity/Trunk Assessment        Lower Extremity Assessment Lower Extremity Assessment: RLE deficits/detail RLE Deficits / Details: unable to perform SLR, maintained KI, ROM TBA, able to perform ankle pumps       Communication   Communication: No difficulties  Cognition Arousal/Alertness: Awake/alert Behavior During Therapy: WFL for tasks assessed/performed Overall Cognitive Status: Within Functional Limits for tasks assessed                                        General Comments  Exercises     Assessment/Plan    PT Assessment Patient needs continued PT services  PT Problem List Decreased strength;Decreased mobility;Decreased range of motion;Decreased knowledge of use of DME;Obesity;Pain       PT Treatment Interventions DME instruction;Therapeutic activities;Stair training;Therapeutic exercise;Gait training;Patient/family education;Functional mobility training    PT Goals (Current goals can be found in the Care Plan section)  Acute Rehab PT Goals PT Goal Formulation: With patient Time For Goal Achievement: 07/30/17 Potential to Achieve Goals: Good    Frequency 7X/week   Barriers to discharge        Co-evaluation                AM-PAC PT "6 Clicks" Daily Activity  Outcome Measure Difficulty turning over in bed (including adjusting bedclothes, sheets and blankets)?: A Little Difficulty moving from lying on back to sitting on the side of the bed? : Unable Difficulty sitting down on and standing up from a chair with arms (e.g., wheelchair, bedside commode, etc,.)?: Unable Help needed moving to and from a bed to chair (including a wheelchair)?: A Little Help needed walking in hospital room?: A Little Help needed climbing 3-5 steps with a railing? : A Lot 6 Click Score: 13    End of Session Equipment Utilized During Treatment: Gait belt;Right knee immobilizer Activity Tolerance: Patient tolerated treatment well Patient left: in chair;with chair alarm set;with call bell/phone within reach   PT Visit Diagnosis: Difficulty in walking, not elsewhere classified (R26.2);Pain Pain - Right/Left: Right Pain - part of body: Knee    Time: 3295-1884 PT Time Calculation (min) (ACUTE ONLY): 19 min   Charges:   PT Evaluation $PT Eval Low Complexity: 1 Low     PT G Codes:       Carmelia Bake, PT, DPT 07/26/2017 Pager: [phone]  York Ram E 07/26/2017, 11:33 AM

## 2017-07-26 NOTE — Progress Notes (Signed)
Patient continues to place pillow underneath operative knee, despite continued education about not doing so. Knee immobilizer left in place to promote keeping leg straight.

## 2017-07-26 NOTE — Progress Notes (Signed)
Pt declined cpap again tonight stating that she is going to wait until she gets back home to use her home machine.  Pt was advised that RT is available all night and encouraged her to call, should she change her mind.

## 2017-07-26 NOTE — Progress Notes (Signed)
Physical Therapy Treatment Patient Details Name: Alexandria Ruiz MRN: [id number] DOB: 07-25-1955 Today's Date: 07/26/2017    History of Present Illness Pt is a 62 year old female s/p R TKA with hx of obesity, neuropathy, DM, and L TKA    PT Comments    Pt attempted to perform exercises in supine as tolerated however having increased pain so deferred further activity. Pt repositioned to comfort.  RN into room end of session.  Follow Up Recommendations  Follow surgeon's recommendation for DC plan and follow-up therapies     Equipment Recommendations  Rolling walker with 5" wheels    Recommendations for Other Services       Precautions / Restrictions Precautions Precautions: Knee Required Braces or Orthoses: Knee Immobilizer - Right Restrictions Other Position/Activity Restrictions: WBAT    Mobility                 Stairs             Wheelchair Mobility    Modified Rankin (Stroke Patients Only)       Balance                                            Cognition Arousal/Alertness: Awake/alert Behavior During Therapy: WFL for tasks assessed/performed Overall Cognitive Status: Within Functional Limits for tasks assessed                                        Exercises Total Joint Exercises Ankle Circles/Pumps: AROM;Both;10 reps Quad Sets: AROM;Both;5 reps Short Arc Quad: AAROM;5 reps;Right Hip ABduction/ADduction: AAROM;5 reps;Right    General Comments        Pertinent Vitals/Pain Pain Assessment: 0-10 Pain Score: 10-Worst pain ever Pain Location: R knee Pain Descriptors / Indicators: Throbbing;Stabbing Pain Intervention(s): Limited activity within patient's tolerance;Monitored during session;Repositioned    Home Living Family/patient expects to be discharged to:: Private residence Living Arrangements: Spouse/significant other   Type of Home: House Home Access: Stairs to enter Entrance  Stairs-Rails: Can reach both Home Layout: One level Home Equipment: Environmental consultant - 4 wheels      Prior Function Level of Independence: Independent          PT Goals (current goals can now be found in the care plan section) Acute Rehab PT Goals PT Goal Formulation: With patient Time For Goal Achievement: 07/30/17 Potential to Achieve Goals: Good Progress towards PT goals: Not progressing toward goals - comment(pain limiting)    Frequency    7X/week      PT Plan Current plan remains appropriate    Co-evaluation              AM-PAC PT "6 Clicks" Daily Activity  Outcome Measure  Difficulty turning over in bed (including adjusting bedclothes, sheets and blankets)?: Unable Difficulty moving from lying on back to sitting on the side of the bed? : Unable Difficulty sitting down on and standing up from a chair with arms (e.g., wheelchair, bedside commode, etc,.)?: Unable Help needed moving to and from a bed to chair (including a wheelchair)?: Total Help needed walking in hospital room?: Total Help needed climbing 3-5 steps with a railing? : Total 6 Click Score: 6    End of Session Equipment Utilized During Treatment: Gait belt;Right knee immobilizer Activity Tolerance: Patient limited  by pain Patient left: in bed;with nursing/sitter in room;with call bell/phone within reach Nurse Communication: Patient requests pain meds PT Visit Diagnosis: Pain Pain - Right/Left: Right Pain - part of body: Knee     Time: 4174-0814 PT Time Calculation (min) (ACUTE ONLY): 13 min  Charges:  $Therapeutic Exercise: 8-22 mins                    G Codes:       Carmelia Bake, PT, DPT 07/26/2017 Pager: [phone]   York Ram E 07/26/2017, 2:47 PM

## 2017-07-27 LAB — CBC
HCT: 31.6 % — ABNORMAL LOW (ref 36.0–46.0)
Hemoglobin: 9.9 g/dL — ABNORMAL LOW (ref 12.0–15.0)
MCH: 28.8 pg (ref 26.0–34.0)
MCHC: 31.3 g/dL (ref 30.0–36.0)
MCV: 91.9 fL (ref 78.0–100.0)
PLATELETS: 276 10*3/uL (ref 150–400)
RBC: 3.44 MIL/uL — ABNORMAL LOW (ref 3.87–5.11)
RDW: 15 % (ref 11.5–15.5)
WBC: 13.6 10*3/uL — AB (ref 4.0–10.5)

## 2017-07-27 LAB — BASIC METABOLIC PANEL
ANION GAP: 11 (ref 5–15)
BUN: 10 mg/dL (ref 6–20)
CALCIUM: 9 mg/dL (ref 8.9–10.3)
CO2: 25 mmol/L (ref 22–32)
Chloride: 100 mmol/L — ABNORMAL LOW (ref 101–111)
Creatinine, Ser: 0.73 mg/dL (ref 0.44–1.00)
GFR calc Af Amer: >60 mL/min (ref >60)
GLUCOSE: 145 mg/dL — AB (ref 65–99)
Potassium: 3.9 mmol/L (ref 3.5–5.1)
Sodium: 136 mmol/L (ref 135–145)

## 2017-07-27 LAB — GLUCOSE, CAPILLARY: Glucose-Capillary: 136 mg/dL — ABNORMAL HIGH (ref 65–99)

## 2017-07-27 MED ORDER — TRAMADOL HCL 50 MG PO TABS: 50.0000 mg | ORAL_TABLET | Freq: Four times a day (QID) | ORAL | Status: DC | PRN

## 2017-07-27 MED ORDER — HYDROMORPHONE HCL 2 MG PO TABS: 4.0000 mg | ORAL_TABLET | ORAL | Status: DC | PRN

## 2017-07-27 MED ORDER — OXYCODONE HCL 5 MG PO TABS: 5.0000 mg | ORAL_TABLET | Freq: Four times a day (QID) | ORAL | 0 refills | Status: DC | PRN

## 2017-07-27 NOTE — Progress Notes (Signed)
Physical Therapy Treatment Patient Details Name: Alexandria Ruiz MRN: [id number] DOB: [birth year] Today's Date: 07/27/2017    History of Present Illness Pt is a 62 year old female s/p R TKA with hx of obesity, neuropathy, DM, and L TKA    PT Comments    Pt ambulated in hallway and practiced safe stair technique.  Pt mobilizing slowly and with effort however min/guard for safety today.  Pt also performed LE exercises.  Pt provided with HEP handout.  Pt reports d/c home today and declines for PT to come back for second session.   Follow Up Recommendations  Follow surgeon's recommendation for DC plan and follow-up therapies     Equipment Recommendations  Rolling walker with 5" wheels    Recommendations for Other Services       Precautions / Restrictions Precautions Precautions: Knee Required Braces or Orthoses: Knee Immobilizer - Right Restrictions Other Position/Activity Restrictions: WBAT    Mobility  Bed Mobility Overal bed mobility: Needs Assistance Bed Mobility: Supine to Sit;Sit to Supine     Supine to sit: Min guard Sit to supine: Min guard   General bed mobility comments: pt able to self assist R LE with increased time  Transfers Overall transfer level: Needs assistance Equipment used: Rolling walker (2 wheeled) Transfers: Sit to/from Stand Sit to Stand: Min guard         General transfer comment: verbal cues for UE and LE positioning, increased time and effort  Ambulation/Gait Ambulation/Gait assistance: Min guard Ambulation Distance (Feet): 80 Feet Assistive device: Rolling walker (2 wheeled) Gait Pattern/deviations: Step-to pattern;Decreased stance time - right;Antalgic     General Gait Details: verbal cues for sequence, RW positioning, step length   Stairs Stairs: Yes Stairs assistance: Min guard Stair Management: Step to pattern;Forwards;Two rails Number of Stairs: 3 General stair comments: verbal cues for safety, sequence; pt reports  understanding   Wheelchair Mobility    Modified Rankin (Stroke Patients Only)       Balance                                            Cognition Arousal/Alertness: Awake/alert Behavior During Therapy: WFL for tasks assessed/performed Overall Cognitive Status: Within Functional Limits for tasks assessed                                        Exercises Total Joint Exercises Ankle Circles/Pumps: AROM;Both;10 reps Quad Sets: AROM;Both;10 reps Short Arc QuadSinclair Ship;Right;10 reps Heel Slides: AAROM;10 reps;Seated;Right Hip ABduction/ADduction: AAROM;Right;10 reps    General Comments        Pertinent Vitals/Pain Pain Assessment: 0-10 Pain Score: 6  Pain Location: R knee Pain Descriptors / Indicators: Throbbing;Stabbing Pain Intervention(s): Repositioned;Limited activity within patient's tolerance;Monitored during session    Home Living                      Prior Function            PT Goals (current goals can now be found in the care plan section) Progress towards PT goals: Progressing toward goals    Frequency    7X/week      PT Plan Current plan remains appropriate    Co-evaluation  AM-PAC PT "6 Clicks" Daily Activity  Outcome Measure  Difficulty turning over in bed (including adjusting bedclothes, sheets and blankets)?: A Lot Difficulty moving from lying on back to sitting on the side of the bed? : A Lot Difficulty sitting down on and standing up from a chair with arms (e.g., wheelchair, bedside commode, etc,.)?: A Lot Help needed moving to and from a bed to chair (including a wheelchair)?: A Little Help needed walking in hospital room?: A Little Help needed climbing 3-5 steps with a railing? : A Little 6 Click Score: 15    End of Session Equipment Utilized During Treatment: Gait belt;Right knee immobilizer Activity Tolerance: Patient tolerated treatment well Patient left: in bed;with  call bell/phone within reach   PT Visit Diagnosis: Pain;Difficulty in walking, not elsewhere classified (R26.2) Pain - Right/Left: Right Pain - part of body: Knee     Time: 5110-2111 PT Time Calculation (min) (ACUTE ONLY): 31 min  Charges:  $Gait Training: 8-22 mins $Therapeutic Exercise: 8-22 mins                    G Codes:       Carmelia Bake, PT, DPT 07/27/2017 Pager: [phone]   York Ram E 07/27/2017, 4:21 PM

## 2017-07-27 NOTE — Discharge Summary (Signed)
Physician Discharge Summary   Patient ID: Alexandria Ruiz MRN: [id number] DOB/AGE: Dec 22, 1955 62 y.o.  Admit date: 07/25/2017 Discharge date: 07/27/2017  Primary Diagnosis: Primary osteoarthritis right knee   Admission Diagnoses:  Past Medical History:  Diagnosis Date  . Abdominal pain   . Allergic rhinitis   . Anemia   . Anxiety   . Arthritis   . Asthma   . Bronchitis   . Diabetes mellitus    type 2  . GERD (gastroesophageal reflux disease)   . Hyperlipidemia   . Hypertension   . Obesity   . Peripheral neuropathy    denies has carpal tunnel  . PONV (postoperative nausea and vomiting)   . Shortness of breath dyspnea    since starting Trulicity diabetic med  . Sleep apnea    uses c-pap machine   Discharge Diagnoses:   Active Problems:   OA (osteoarthritis) of knee  Estimated body mass index is 43.24 kg/m as calculated from the following:   Height as of this encounter: _0  (1.803 m).   Weight as of this encounter: 140.6 kg (310 lb).  Procedure:  Procedure(s) (LRB): RIGHT TOTAL KNEE ARTHROPLASTY (Right)   Consults: None  HPI: The patient is known to have right knee pain and osteoarthritis. They are now month(s) out from cortisone injection. Symptoms reported include: pain, aching, popping, grinding, difficulty ambulating and difficulty arising from chair. The patient feels that they are doing poorly. Current treatment includes: bracing. The patient has reported only temporary improvement of their symptoms with Cortisone injections. Her right knee is giving her increasing problems. She had a cortisone shot over three months ago and it did not provided a tremendous amount of benefit and only for a short time. She said the knee is definitely limiting what she can and cannot do. Her left total knee is doing extremely well and she is very pleased with that a little bit over a year out. We discussed treatment options including repeat injection, which is not worth it  because she did not get any benefit from the last ones. She does not want to do anymore injections. We discussed pursuing total knee arthroplasty. She has reached a point where she would like to proceed with surgery at this time.    Laboratory Data: Admission on 07/25/2017, Discharged on 07/27/2017  Component Date Value Ref Range Status  . Glucose-Capillary 07/25/2017 92  65 - 99 mg/dL Final  . WBC 07/26/2017 10.7* 4.0 - 10.5 K/uL Final  . RBC 07/26/2017 3.63* 3.87 - 5.11 MIL/uL Final  . Hemoglobin 07/26/2017 10.4* 12.0 - 15.0 g/dL Final  . HCT 07/26/2017 33.5* 36.0 - 46.0 % Final  . MCV 07/26/2017 92.3  78.0 - 100.0 fL Final  . MCH 07/26/2017 28.7  26.0 - 34.0 pg Final  . MCHC 07/26/2017 31.0  30.0 - 36.0 g/dL Final  . RDW 07/26/2017 14.7  11.5 - 15.5 % Final  . Platelets 07/26/2017 267  150 - 400 K/uL Final   Performed at [facility], [place] [address], [place], [place] [address]  . Sodium 07/26/2017 139  135 - 145 mmol/L Final  . Potassium 07/26/2017 4.4  3.5 - 5.1 mmol/L Final  . Chloride 07/26/2017 104  101 - 111 mmol/L Final  . CO2 07/26/2017 23  22 - 32 mmol/L Final  . Glucose, Bld 07/26/2017 198* 65 - 99 mg/dL Final  . BUN 07/26/2017 10  6 - 20 mg/dL Final  . Creatinine, Ser 07/26/2017 0.83  0.44 -  1.00 mg/dL Final  . Calcium 07/26/2017 8.8* 8.9 - 10.3 mg/dL Final  . GFR calc non Af Amer 07/26/2017 >60  >60 mL/min Final  . GFR calc Af Amer 07/26/2017 >60  >60 mL/min Final   Comment: (NOTE) The eGFR has been calculated using the CKD EPI equation. This calculation has not been validated in all clinical situations. eGFR's persistently <60 mL/min signify possible Chronic Kidney Disease.   Georgiann Hahn gap 07/26/2017 12  5 - 15 Final   Performed at [facility], [place] [address] [place], [place] [address]  . Glucose-Capillary 07/25/2017 245* 65 - 99 mg/dL Final  . Glucose-Capillary 07/25/2017 192* 65 - 99 mg/dL Final  . Glucose-Capillary  07/26/2017 175* 65 - 99 mg/dL Final  . Glucose-Capillary 07/26/2017 221* 65 - 99 mg/dL Final  . Glucose-Capillary 07/26/2017 177* 65 - 99 mg/dL Final  . WBC 07/27/2017 13.6* 4.0 - 10.5 K/uL Final  . RBC 07/27/2017 3.44* 3.87 - 5.11 MIL/uL Final  . Hemoglobin 07/27/2017 9.9* 12.0 - 15.0 g/dL Final  . HCT 07/27/2017 31.6* 36.0 - 46.0 % Final  . MCV 07/27/2017 91.9  78.0 - 100.0 fL Final  . MCH 07/27/2017 28.8  26.0 - 34.0 pg Final  . MCHC 07/27/2017 31.3  30.0 - 36.0 g/dL Final  . RDW 07/27/2017 15.0  11.5 - 15.5 % Final  . Platelets 07/27/2017 276  150 - 400 K/uL Final   Performed at [facility], [place] [address], [place], [place] [address]  . Sodium 07/27/2017 136  135 - 145 mmol/L Final  . Potassium 07/27/2017 3.9  3.5 - 5.1 mmol/L Final  . Chloride 07/27/2017 100* 101 - 111 mmol/L Final  . CO2 07/27/2017 25  22 - 32 mmol/L Final  . Glucose, Bld 07/27/2017 145* 65 - 99 mg/dL Final  . BUN 07/27/2017 10  6 - 20 mg/dL Final  . Creatinine, Ser 07/27/2017 0.73  0.44 - 1.00 mg/dL Final  . Calcium 07/27/2017 9.0  8.9 - 10.3 mg/dL Final  . GFR calc non Af Amer 07/27/2017 >60  >60 mL/min Final  . GFR calc Af Amer 07/27/2017 >60  >60 mL/min Final   Comment: (NOTE) The eGFR has been calculated using the CKD EPI equation. This calculation has not been validated in all clinical situations. eGFR's persistently <60 mL/min signify possible Chronic Kidney Disease.   Georgiann Hahn gap 07/27/2017 11  5 - 15 Final   Performed at [facility], [place] [address], [place], [place] [address]  . Glucose-Capillary 07/26/2017 150* 65 - 99 mg/dL Final  . Glucose-Capillary 07/27/2017 136* 65 - 99 mg/dL Final  Hospital Outpatient Visit on 07/15/2017  Component Date Value Ref Range Status  . aPTT 07/15/2017 31  24 - 36 seconds Final   Performed at [facility], [place] [address], [place], [place] [address]  . WBC 07/15/2017 8.1  4.0 - 10.5 K/uL Final  . RBC  07/15/2017 4.00  3.87 - 5.11 MIL/uL Final  . Hemoglobin 07/15/2017 11.5* 12.0 - 15.0 g/dL Final  . HCT 07/15/2017 36.8  36.0 - 46.0 % Final  . MCV 07/15/2017 92.0  78.0 - 100.0 fL Final  . MCH 07/15/2017 28.8  26.0 - 34.0 pg Final  . MCHC 07/15/2017 31.3  30.0 - 36.0 g/dL Final  . RDW 07/15/2017 15.5  11.5 - 15.5 % Final  . Platelets 07/15/2017 310  150 - 400 K/uL Final   Performed at [facility], [place]  [address], [place], [place] [address]  . Sodium 07/15/2017 141  135 - 145 mmol/L Final  . Potassium 07/15/2017 3.7  3.5 - 5.1 mmol/L Final  . Chloride 07/15/2017 108  101 - 111 mmol/L Final  . CO2 07/15/2017 24  22 - 32 mmol/L Final  . Glucose, Bld 07/15/2017 91  65 - 99 mg/dL Final  . BUN 07/15/2017 21* 6 - 20 mg/dL Final  . Creatinine, Ser 07/15/2017 0.82  0.44 - 1.00 mg/dL Final  . Calcium 07/15/2017 9.1  8.9 - 10.3 mg/dL Final  . Total Protein 07/15/2017 7.1  6.5 - 8.1 g/dL Final  . Albumin 07/15/2017 3.7  3.5 - 5.0 g/dL Final  . AST 07/15/2017 21  15 - 41 U/L Final  . ALT 07/15/2017 17  14 - 54 U/L Final  . Alkaline Phosphatase 07/15/2017 71  38 - 126 U/L Final  . Total Bilirubin 07/15/2017 0.8  0.3 - 1.2 mg/dL Final  . GFR calc non Af Amer 07/15/2017 >60  >60 mL/min Final  . GFR calc Af Amer 07/15/2017 >60  >60 mL/min Final   Comment: (NOTE) The eGFR has been calculated using the CKD EPI equation. This calculation has not been validated in all clinical situations. eGFR's persistently <60 mL/min signify possible Chronic Kidney Disease.   Georgiann Hahn gap 07/15/2017 9  5 - 15 Final   Performed at [facility], [place] [address], [place], [place] [address]  . Prothrombin Time 07/15/2017 12.8  11.4 - 15.2 seconds Final  . INR 07/15/2017 0.97   Final   Performed at [place] [address], [place], [place] [address]  . ABO/RH(D) 07/15/2017 O POS   Final  . Antibody Screen 07/15/2017 NEG   Final  . Sample Expiration 07/15/2017  07/28/2017   Final  . Extend sample reason 07/15/2017    Final                   Value:NO TRANSFUSIONS OR PREGNANCY IN THE PAST 3 MONTHS Performed at [facility], [place] [address], [place], [place] [address]   . MRSA, PCR 07/15/2017 INVALID RESULTS, SPECIMEN SENT FOR CULTURE* NEGATIVE Final   Comment: RESULT CALLED TO, READ BACK BY AND VERIFIED WITH: DIXON, BRAD PA _0  ON 05.03.19 BY COHEN,K   . Staphylococcus aureus 07/15/2017 INVALID RESULTS, SPECIMEN SENT FOR CULTURE* NEGATIVE Final   Performed at [place] [address], [place], [place] [address]  . Glucose-Capillary 07/15/2017 103* 65 - 99 mg/dL Final  . Specimen Description 07/15/2017    Final                   Value:NOSE Performed at [facility], [place] [address], [place], [place] [address]   . Special Requests 07/15/2017    Final                   Value:NONE Performed at [facility], [place] [address], [place], [place] [address]   . Culture 07/15/2017    Final                   Value:NO MRSA DETECTED Performed at [facility], [place] [address], [place], [place] [address]   . Report Status 07/15/2017 07/17/2017 FINAL   Final     Hospital Course: Alexandria Ruiz is a 62 y.o. who was admitted to [facility]. They were brought to the operating room on 07/25/2017  and underwent Procedure(s): RIGHT TOTAL KNEE ARTHROPLASTY.  Patient tolerated the procedure well and was later transferred to the recovery room and then to the orthopaedic floor for postoperative care.  They were given PO and IV analgesics for pain control following their surgery.  They were given 24 hours of postoperative antibiotics of  Anti-infectives (From admission, onward)   Start     Dose/Rate Route Frequency Ordered Stop   07/26/17 1000  valACYclovir (VALTREX) tablet 1,000 mg  Status:  Discontinued     1,000 mg Oral Daily 07/25/17 1449 07/27/17 1459   07/25/17 1800   ceFAZolin (ANCEF) IVPB 2g/100 mL premix     2 g 200 mL/hr over 30 Minutes Intravenous Every 6 hours 07/25/17 1449 07/25/17 2349   07/25/17 0600  ceFAZolin (ANCEF) 3 g in dextrose 5 % 50 mL IVPB     3 g 100 mL/hr over 30 Minutes Intravenous On call to O.R. 07/24/17 5597 07/25/17 1215     and started on DVT prophylaxis in the form of Aspirin.   PT and OT were ordered for total joint protocol.  Discharge planning consulted to help with postop disposition and equipment needs.  Patient had a good night on the evening of surgery.  They started to get up OOB with therapy on day one. Hemovac drain was pulled without difficulty. She did have some trouble with pain management post op day one but improved with adjustment of medications. Continued to work with therapy into day two.  Dressing was changed on day two and the incision was clean and dry.  The patient had progressed with therapy and meeting their goals.  Incision was healing well.  Patient was seen in rounds and was ready to go home.   Diet: Cardiac diet Activity:WBAT Follow-up:in 2 weeks Disposition - Home Discharged Condition: stable   Discharge Instructions    Call MD / Call 911   Complete by:  As directed    If you experience chest pain or shortness of breath, CALL 911 and be transported to the hospital emergency room.  If you develope a fever above 101 F, pus (white drainage) or increased drainage or redness at the wound, or calf pain, call your surgeon's office.   Constipation Prevention   Complete by:  As directed    Drink plenty of fluids.  Prune juice may be helpful.  You may use a stool softener, such as Colace (over the counter) 100 mg twice a day.  Use MiraLax (over the counter) for constipation as needed.   Diet - low sodium heart healthy   Complete by:  As directed    Discharge instructions   Complete by:  As directed    .   Dr. Gaynelle Arabian Total Joint Specialist [facility] [address] [place],  [place] [address] [phone]  TOTAL KNEE REPLACEMENT POSTOPERATIVE DIRECTIONS  Knee Rehabilitation, Guidelines Following Surgery  Results after knee surgery are often greatly improved when you follow the exercise, range of motion and muscle strengthening exercises prescribed by your doctor. Safety measures are also important to protect the knee from further injury. Any time any of these exercises cause you to have increased pain or swelling in your knee joint, decrease the amount until you are comfortable again and slowly increase them. If you have problems or questions, call your caregiver or physical therapist for advice.   HOME CARE INSTRUCTIONS  Remove items at home which could result in a fall. This includes throw rugs or furniture  in walking pathways.  ICE to the affected knee every three hours for 30 minutes at a time and then as needed for pain and swelling.  Continue to use ice on the knee for pain and swelling from surgery. You may notice swelling that will progress down to the foot and ankle.  This is normal after surgery.  Elevate the leg when you are not up walking on it.   Continue to use the breathing machine which will help keep your temperature down.  It is common for your temperature to cycle up and down following surgery, especially at night when you are not up moving around and exerting yourself.  The breathing machine keeps your lungs expanded and your temperature down. Do not place pillow under knee, focus on keeping the knee straight while resting  DIET You may resume your previous home diet once your are discharged from the hospital.  DRESSING / WOUND CARE / SHOWERING You may change your dressing every day with sterile gauze.  Please use good hand washing techniques before changing the dressing.  Do not use any lotions or creams on the incision until instructed by your surgeon. You may start showering once you are discharged home but do not submerge the incision under water.  Just pat the incision dry and apply a dry gauze dressing on daily. Change the surgical dressing daily and reapply a dry dressing each time.  ACTIVITY Walk with your walker as instructed. Use walker as long as suggested by your caregivers. Avoid periods of inactivity such as sitting longer than an hour when not asleep. This helps prevent blood clots.  You may resume a sexual relationship in one month or when given the OK by your doctor.  You may return to work once you are cleared by your doctor.  Do not drive a car for 6 weeks or until released by you surgeon.  Do not drive while taking narcotics.  WEIGHT BEARING Weight bearing as tolerated with assist device (walker, cane, etc) as directed, use it as long as suggested by your surgeon or therapist, typically at least 4-6 weeks.  POSTOPERATIVE CONSTIPATION PROTOCOL Constipation - defined medically as fewer than three stools per week and severe constipation as less than one stool per week.  One of the most common issues patients have following surgery is constipation.  Even if you have a regular bowel pattern at home, your normal regimen is likely to be disrupted due to multiple reasons following surgery.  Combination of anesthesia, postoperative narcotics, change in appetite and fluid intake all can affect your bowels.  In order to avoid complications following surgery, here are some recommendations in order to help you during your recovery period.  Colace (docusate) - Pick up an over-the-counter form of Colace or another stool softener and take twice a day as long as you are requiring postoperative pain medications.  Take with a full glass of water daily.  If you experience loose stools or diarrhea, hold the colace until you stool forms back up.  If your symptoms do not get better within 1 week or if they get worse, check with your doctor.  Dulcolax (bisacodyl) - Pick up over-the-counter and take as directed by the product packaging as needed  to assist with the movement of your bowels.  Take with a full glass of water.  Use this product as needed if not relieved by Colace only.   MiraLax (polyethylene glycol) - Pick up over-the-counter to have on hand.  MiraLax is  a solution that will increase the amount of water in your bowels to assist with bowel movements.  Take as directed and can mix with a glass of water, juice, soda, coffee, or tea.  Take if you go more than two days without a movement. Do not use MiraLax more than once per day. Call your doctor if you are still constipated or irregular after using this medication for 7 days in a row.  If you continue to have problems with postoperative constipation, please contact the office for further assistance and recommendations.  If you experience "the worst abdominal pain ever" or develop nausea or vomiting, please contact the office immediatly for further recommendations for treatment.  ITCHING  If you experience itching with your medications, try taking only a single pain pill, or even half a pain pill at a time.  You can also use Benadryl over the counter for itching or also to help with sleep.   TED HOSE STOCKINGS Wear the elastic stockings on both legs for three weeks following surgery during the day but you may remove then at night for sleeping.  MEDICATIONS See your medication summary on the "After Visit Summary" that the nursing staff will review with you prior to discharge.  You may have some home medications which will be placed on hold until you complete the course of blood thinner medication.  It is important for you to complete the blood thinner medication as prescribed by your surgeon.  Continue your approved medications as instructed at time of discharge.  PRECAUTIONS If you experience chest pain or shortness of breath - call 911 immediately for transfer to the hospital emergency department.  If you develop a fever greater that 101 F, purulent drainage from wound, increased  redness or drainage from wound, foul odor from the wound/dressing, or calf pain - CONTACT YOUR SURGEON.                                                   FOLLOW-UP APPOINTMENTS Make sure you keep all of your appointments after your operation with your surgeon and caregivers. You should call the office at the above phone number and make an appointment for approximately two weeks after the date of your surgery or on the date instructed by your surgeon outlined in the "After Visit Summary".   RANGE OF MOTION AND STRENGTHENING EXERCISES  Rehabilitation of the knee is important following a knee injury or an operation. After just a few days of immobilization, the muscles of the thigh which control the knee become weakened and shrink (atrophy). Knee exercises are designed to build up the tone and strength of the thigh muscles and to improve knee motion. Often times heat used for twenty to thirty minutes before working out will loosen up your tissues and help with improving the range of motion but do not use heat for the first two weeks following surgery. These exercises can be done on a training (exercise) mat, on the floor, on a table or on a bed. Use what ever works the best and is most comfortable for you Knee exercises include:  Leg Lifts - While your knee is still immobilized in a splint or cast, you can do straight leg raises. Lift the leg to 60 degrees, hold for 3 sec, and slowly lower the leg. Repeat 10-20 times 2-3 times daily.  Perform this exercise against resistance later as your knee gets better.  Quad and Hamstring Sets - Tighten up the muscle on the front of the thigh (Quad) and hold for 5-10 sec. Repeat this 10-20 times hourly. Hamstring sets are done by pushing the foot backward against an object and holding for 5-10 sec. Repeat as with quad sets.  Leg Slides: Lying on your back, slowly slide your foot toward your buttocks, bending your knee up off the floor (only go as far as is comfortable).  Then slowly slide your foot back down until your leg is flat on the floor again. Angel Wings: Lying on your back spread your legs to the side as far apart as you can without causing discomfort.  A rehabilitation program following serious knee injuries can speed recovery and prevent re-injury in the future due to weakened muscles. Contact your doctor or a physical therapist for more information on knee rehabilitation.   IF YOU ARE TRANSFERRED TO A SKILLED REHAB FACILITY If the patient is transferred to a skilled rehab facility following release from the hospital, a list of the current medications will be sent to the facility for the patient to continue.  When discharged from the skilled rehab facility, please have the facility set up the patient's [place] prior to being released. Also, the skilled facility will be responsible for providing the patient with their medications at time of release from the facility to include their pain medication, the muscle relaxants, and their blood thinner medication. If the patient is still at the rehab facility at time of the two week follow up appointment, the skilled rehab facility will also need to assist the patient in arranging follow up appointment in our office and any transportation needs.  MAKE SURE YOU:  Understand these instructions.  Get help right away if you are not doing well or get worse.    Pick up stool softner and laxative for home use following surgery while on pain medications. Do not submerge incision under water. Please use good hand washing techniques while changing dressing each day. May shower starting three days after surgery. Please use a clean towel to pat the incision dry following showers. Continue to use ice for pain and swelling after surgery. Do not use any lotions or creams on the incision until instructed by your surgeon.   Increase activity slowly as tolerated   Complete by:  As directed      Allergies  as of 07/27/2017      Reactions   Hydrocodone Itching   Oxycodone Itching      Medication List    STOP taking these medications   cephALEXin 500 MG capsule Commonly known as:  [place] Compression Stockings Misc     TAKE these medications   albuterol 108 (90 Base) MCG/ACT inhaler Commonly known as:  PROVENTIL HFA;VENTOLIN HFA Inhale 2 puffs into the lungs every 6 (six) hours as needed for wheezing.   aspirin 325 MG EC tablet Take 1 tablet (325 mg total) by mouth 2 (two) times daily. What changed:    medication strength  See the new instructions.   atorvastatin 40 MG tablet Commonly known as:  LIPITOR Take 40 mg by mouth daily.   AZOR 5-40 MG tablet Generic drug:  amLODipine-olmesartan Take 1 tablet by mouth daily.   cetirizine 10 MG tablet Commonly known as:  ZYRTEC Take 10 mg by mouth every morning.   diclofenac 75 MG EC tablet Commonly known as:  VOLTAREN Take 75 mg by mouth 2 (two) times daily.   escitalopram 10 MG tablet Commonly known as:  LEXAPRO Take 10 mg by mouth every morning.   Eszopiclone 3 MG Tabs Take 3 mg by mouth at bedtime as needed.   FERREX 150 150 MG capsule Generic drug:  iron polysaccharides Take 150 mg by mouth 2 (two) times daily.   gabapentin 300 MG capsule Commonly known as:  NEURONTIN Take 1 capsule (300 mg total) by mouth 3 (three) times daily. Gabapentin 300 mg Protocol Take a 300 mg capsule three times a day for two weeks, Then a 300 mg capsule twice a day for two weeks, Then a 300 mg capsule once a day for two weeks, then discontinue the Gabapentin.   hydrOXYzine 25 MG tablet Commonly known as:  ATARAX/VISTARIL Take 25 mg by mouth 3 (three) times daily as needed for itching.   methocarbamol 500 MG tablet Commonly known as:  ROBAXIN Take 1 tablet (500 mg total) by mouth every 6 (six) hours as needed for muscle spasms.   oxyCODONE 5 MG immediate release tablet Commonly known as:  Oxy IR/ROXICODONE Take 1-2  tablets (5-10 mg total) by mouth every 6 (six) hours as needed for moderate pain or severe pain.   PENNSAID 2 % Soln Generic drug:  Diclofenac Sodium Apply 2 (two) pumps topically to affected area(s) twice a day   RABEprazole 20 MG tablet Commonly known as:  ACIPHEX Take 20 mg by mouth 2 (two) times daily.   SPIRIVA RESPIMAT 1.25 MCG/ACT Aers Generic drug:  Tiotropium Bromide Monohydrate   traMADol 50 MG tablet Commonly known as:  ULTRAM Take 1-2 tablets (50-100 mg total) by mouth every 6 (six) hours as needed for moderate pain (if hydromorphone ineffective).   triamcinolone cream 0.1 % Commonly known as:  KENALOG 1 APPLICATION TO AFFECTED AREA TWICE A DAY, SPARRINGLY AS NEEDED FOR RASH EXTERNALLY 30 DAYS   TRULICITY 1.5 QA/4.4LP Sopn Generic drug:  Dulaglutide Inject 0.5 mLs into the skin once a week. On Saturdays   valACYclovir 1000 MG tablet Commonly known as:  VALTREX Take 1,000 mg by mouth daily.   VYVANSE 70 MG capsule Generic drug:  lisdexamfetamine Take 70 mg by mouth daily.      Follow-up Information    Gaynelle Arabian, MD. Schedule an appointment as soon as possible for a visit on 08/09/2017.   Specialty:  Orthopedic Surgery Contact information: [address] [place] [address] [phone]        [facility] - Dme Follow up.   Why:  walker Contact information: [address] [place] [address] [phone]           Signed: Ardeen Jourdain, PA-C Orthopaedic Surgery 07/27/2017, 9:24 PM

## 2017-07-27 NOTE — Progress Notes (Signed)
Patient discharged to home w/ husband. Given all belongings, instructions, prescriptions. Verbalized understanding of all instructions. Escorted to pov via w/c.

## 2017-07-27 NOTE — Progress Notes (Signed)
   Subjective: 2 Days Post-Op Procedure(s) (LRB): RIGHT TOTAL KNEE ARTHROPLASTY (Right) Patient reports pain as moderate.   Patient seen in rounds with Dr. Wynelle Link. Patient is well, but has had some minor complaints of itching after switching pain medication to oxycodone. Reports itching has been well managed with Atarax. Voiding and positive flatus. Plan is to go Home after hospital stay.  Objective: Vital signs in last 24 hours: Temp:  [98.7 F (37.1 C)-99 F (37.2 C)] 98.7 F (37.1 C) (05/15 0525) Pulse Rate:  [91-102] 102 (05/15 0525) Resp:  [14-20] 20 (05/15 0525) BP: (138-153)/(77-92) 153/92 (05/15 0525) SpO2:  [92 %-93 %] 93 % (05/15 0525)  Intake/Output from previous day:  Intake/Output Summary (Last 24 hours) at 07/27/2017 0747 Last data filed at 07/27/2017 0600 Gross per 24 hour  Intake 1460 ml  Output 600 ml  Net 860 ml    Intake/Output this shift: No intake/output data recorded.  Labs: Recent Labs    07/26/17 0522 07/27/17 0500  HGB 10.4* 9.9*   Recent Labs    07/26/17 0522 07/27/17 0500  WBC 10.7* 13.6*  RBC 3.63* 3.44*  HCT 33.5* 31.6*  PLT 267 276   Recent Labs    07/26/17 0522 07/27/17 0500  NA 139 136  K 4.4 3.9  CL 104 100*  CO2 23 25  BUN 10 10  CREATININE 0.83 0.73  GLUCOSE 198* 145*  CALCIUM 8.8* 9.0   No results for input(s): LABPT, INR in the last 72 hours.  EXAM General - Patient is Alert, Appropriate and Oriented Extremity - Neurovascular intact Sensation intact distally Intact pulses distally Dorsiflexion/Plantar flexion intact Dressing/Incision - clean, dry, no drainage Motor Function - intact, moving foot and toes well on exam.   Past Medical History:  Diagnosis Date  . Abdominal pain   . Allergic rhinitis   . Anemia   . Anxiety   . Arthritis   . Asthma   . Bronchitis   . Diabetes mellitus    type 2  . GERD (gastroesophageal reflux disease)   . Hyperlipidemia   . Hypertension   . Obesity   . Peripheral  neuropathy    denies has carpal tunnel  . PONV (postoperative nausea and vomiting)   . Shortness of breath dyspnea    since starting Trulicity diabetic med  . Sleep apnea    uses c-pap machine    Assessment/Plan: 2 Days Post-Op Procedure(s) (LRB): RIGHT TOTAL KNEE ARTHROPLASTY (Right) Active Problems:   OA (osteoarthritis) of knee  Estimated body mass index is 43.24 kg/m as calculated from the following:   Height as of this encounter: 5\' 11"  (1.803 m).   Weight as of this encounter: 140.6 kg (310 lb). Up with therapy  DVT Prophylaxis - Aspirin Weight-Bearing as tolerated.  Possible discharge home today if meeting therapy goals.  Ardeen Jourdain, PA-C Orthopaedic Surgery 07/27/2017, 7:47 AM

## 2017-07-29 DIAGNOSIS — M25561 Pain in right knee: Secondary | ICD-10-CM | POA: Diagnosis not present

## 2017-08-03 ENCOUNTER — Ambulatory Visit (HOSPITAL_COMMUNITY)
Admission: RE | Admit: 2017-08-03 | Discharge: 2017-08-03 | Disposition: A | Discharge status: Home / Self Care | Payer: 59 | Source: Ambulatory Visit | Attending: Family Medicine | Admitting: Family Medicine

## 2017-08-03 ENCOUNTER — Other Ambulatory Visit (HOSPITAL_COMMUNITY): Payer: Self-pay | Admitting: Orthopedic Surgery

## 2017-08-03 DIAGNOSIS — M7989 Other specified soft tissue disorders: Secondary | ICD-10-CM | POA: Diagnosis not present

## 2017-08-03 DIAGNOSIS — M79604 Pain in right leg: Secondary | ICD-10-CM | POA: Diagnosis not present

## 2017-08-03 DIAGNOSIS — M79661 Pain in right lower leg: Secondary | ICD-10-CM | POA: Diagnosis present

## 2017-08-03 NOTE — Progress Notes (Signed)
Right lower extremity venous duplex completed. Preliminary results- No evidence of DVT, superficial thrombosis, or Baker's cyst. Toma Copier, RVS 08/03/2017 11:19 AM

## 2017-08-05 DIAGNOSIS — M25569 Pain in unspecified knee: Secondary | ICD-10-CM | POA: Diagnosis not present

## 2017-08-10 DIAGNOSIS — M25569 Pain in unspecified knee: Secondary | ICD-10-CM | POA: Diagnosis not present

## 2017-08-12 DIAGNOSIS — M25569 Pain in unspecified knee: Secondary | ICD-10-CM | POA: Diagnosis not present

## 2017-08-16 DIAGNOSIS — M25561 Pain in right knee: Secondary | ICD-10-CM | POA: Diagnosis not present

## 2017-08-18 DIAGNOSIS — M25561 Pain in right knee: Secondary | ICD-10-CM | POA: Diagnosis not present

## 2017-08-25 DIAGNOSIS — M25569 Pain in unspecified knee: Secondary | ICD-10-CM | POA: Diagnosis not present

## 2017-08-29 DIAGNOSIS — M25569 Pain in unspecified knee: Secondary | ICD-10-CM | POA: Diagnosis not present

## 2017-08-30 DIAGNOSIS — Z471 Aftercare following joint replacement surgery: Secondary | ICD-10-CM | POA: Diagnosis not present

## 2017-08-30 DIAGNOSIS — Z96651 Presence of right artificial knee joint: Secondary | ICD-10-CM | POA: Diagnosis not present

## 2017-09-01 DIAGNOSIS — M25569 Pain in unspecified knee: Secondary | ICD-10-CM | POA: Diagnosis not present

## 2017-09-05 DIAGNOSIS — G4733 Obstructive sleep apnea (adult) (pediatric): Secondary | ICD-10-CM | POA: Diagnosis not present

## 2017-09-26 DIAGNOSIS — M65341 Trigger finger, right ring finger: Secondary | ICD-10-CM | POA: Diagnosis not present

## 2017-10-13 DIAGNOSIS — G4733 Obstructive sleep apnea (adult) (pediatric): Secondary | ICD-10-CM | POA: Diagnosis not present

## 2017-11-21 DIAGNOSIS — M4317 Spondylolisthesis, lumbosacral region: Secondary | ICD-10-CM | POA: Diagnosis not present

## 2017-11-29 DIAGNOSIS — M5136 Other intervertebral disc degeneration, lumbar region: Secondary | ICD-10-CM | POA: Diagnosis not present

## 2017-11-29 DIAGNOSIS — M4317 Spondylolisthesis, lumbosacral region: Secondary | ICD-10-CM | POA: Diagnosis not present

## 2017-11-29 DIAGNOSIS — E119 Type 2 diabetes mellitus without complications: Secondary | ICD-10-CM | POA: Diagnosis not present

## 2017-12-08 DIAGNOSIS — M65341 Trigger finger, right ring finger: Secondary | ICD-10-CM | POA: Diagnosis not present

## 2017-12-20 DIAGNOSIS — G4733 Obstructive sleep apnea (adult) (pediatric): Secondary | ICD-10-CM | POA: Diagnosis not present

## 2017-12-20 DIAGNOSIS — R0602 Shortness of breath: Secondary | ICD-10-CM | POA: Diagnosis not present

## 2017-12-20 DIAGNOSIS — J309 Allergic rhinitis, unspecified: Secondary | ICD-10-CM | POA: Diagnosis not present

## 2017-12-20 DIAGNOSIS — R05 Cough: Secondary | ICD-10-CM | POA: Diagnosis not present

## 2017-12-22 DIAGNOSIS — M25541 Pain in joints of right hand: Secondary | ICD-10-CM | POA: Diagnosis not present

## 2017-12-27 DIAGNOSIS — M5136 Other intervertebral disc degeneration, lumbar region: Secondary | ICD-10-CM | POA: Diagnosis not present

## 2017-12-27 DIAGNOSIS — M47816 Spondylosis without myelopathy or radiculopathy, lumbar region: Secondary | ICD-10-CM | POA: Diagnosis not present

## 2018-01-11 DIAGNOSIS — E782 Mixed hyperlipidemia: Secondary | ICD-10-CM | POA: Diagnosis not present

## 2018-01-11 DIAGNOSIS — I10 Essential (primary) hypertension: Secondary | ICD-10-CM | POA: Diagnosis not present

## 2018-01-11 DIAGNOSIS — Z23 Encounter for immunization: Secondary | ICD-10-CM | POA: Diagnosis not present

## 2018-01-11 DIAGNOSIS — E119 Type 2 diabetes mellitus without complications: Secondary | ICD-10-CM | POA: Diagnosis not present

## 2018-02-13 DIAGNOSIS — Z86010 Personal history of colonic polyps: Secondary | ICD-10-CM | POA: Diagnosis not present

## 2018-02-13 DIAGNOSIS — K219 Gastro-esophageal reflux disease without esophagitis: Secondary | ICD-10-CM | POA: Diagnosis not present

## 2018-02-13 DIAGNOSIS — D123 Benign neoplasm of transverse colon: Secondary | ICD-10-CM | POA: Diagnosis not present

## 2018-02-13 DIAGNOSIS — K449 Diaphragmatic hernia without obstruction or gangrene: Secondary | ICD-10-CM | POA: Diagnosis not present

## 2018-02-13 DIAGNOSIS — K31819 Angiodysplasia of stomach and duodenum without bleeding: Secondary | ICD-10-CM | POA: Diagnosis not present

## 2018-02-13 DIAGNOSIS — D125 Benign neoplasm of sigmoid colon: Secondary | ICD-10-CM | POA: Diagnosis not present

## 2018-02-13 DIAGNOSIS — K635 Polyp of colon: Secondary | ICD-10-CM | POA: Diagnosis not present

## 2018-03-21 DIAGNOSIS — H353131 Nonexudative age-related macular degeneration, bilateral, early dry stage: Secondary | ICD-10-CM | POA: Diagnosis not present

## 2018-03-21 DIAGNOSIS — E119 Type 2 diabetes mellitus without complications: Secondary | ICD-10-CM | POA: Diagnosis not present

## 2018-04-03 DIAGNOSIS — G4733 Obstructive sleep apnea (adult) (pediatric): Secondary | ICD-10-CM | POA: Diagnosis not present

## 2018-05-02 DIAGNOSIS — M4316 Spondylolisthesis, lumbar region: Secondary | ICD-10-CM | POA: Diagnosis not present

## 2018-05-02 DIAGNOSIS — E119 Type 2 diabetes mellitus without complications: Secondary | ICD-10-CM | POA: Diagnosis not present

## 2018-05-02 DIAGNOSIS — M5136 Other intervertebral disc degeneration, lumbar region: Secondary | ICD-10-CM | POA: Diagnosis not present

## 2018-05-25 DIAGNOSIS — M5136 Other intervertebral disc degeneration, lumbar region: Secondary | ICD-10-CM | POA: Diagnosis not present

## 2018-06-05 DIAGNOSIS — Z01419 Encounter for gynecological examination (general) (routine) without abnormal findings: Secondary | ICD-10-CM | POA: Diagnosis not present

## 2018-06-05 DIAGNOSIS — Z1231 Encounter for screening mammogram for malignant neoplasm of breast: Secondary | ICD-10-CM | POA: Diagnosis not present

## 2018-07-07 DIAGNOSIS — J45909 Unspecified asthma, uncomplicated: Secondary | ICD-10-CM | POA: Diagnosis not present

## 2018-07-07 DIAGNOSIS — G4733 Obstructive sleep apnea (adult) (pediatric): Secondary | ICD-10-CM | POA: Diagnosis not present

## 2018-07-31 DIAGNOSIS — E119 Type 2 diabetes mellitus without complications: Secondary | ICD-10-CM | POA: Diagnosis not present

## 2018-07-31 DIAGNOSIS — I10 Essential (primary) hypertension: Secondary | ICD-10-CM | POA: Diagnosis not present

## 2018-07-31 DIAGNOSIS — E782 Mixed hyperlipidemia: Secondary | ICD-10-CM | POA: Diagnosis not present

## 2018-08-25 ENCOUNTER — Other Ambulatory Visit (HOSPITAL_COMMUNITY): Payer: Self-pay | Admitting: Gastroenterology

## 2018-08-25 DIAGNOSIS — K219 Gastro-esophageal reflux disease without esophagitis: Secondary | ICD-10-CM

## 2018-09-08 ENCOUNTER — Other Ambulatory Visit: Payer: Self-pay

## 2018-09-08 ENCOUNTER — Encounter (HOSPITAL_COMMUNITY)
Admission: RE | Admit: 2018-09-08 | Discharge: 2018-09-08 | Disposition: A | Discharge status: Home / Self Care | Payer: 59 | Source: Ambulatory Visit | Attending: Gastroenterology | Admitting: Gastroenterology

## 2018-09-08 DIAGNOSIS — K219 Gastro-esophageal reflux disease without esophagitis: Secondary | ICD-10-CM | POA: Insufficient documentation

## 2018-09-08 MED ORDER — TECHNETIUM TC 99M SULFUR COLLOID
2.0000 | Freq: Once | INTRAVENOUS | Status: AC | PRN
  Administered 2018-09-08: 2 via ORAL

## 2019-04-16 ENCOUNTER — Other Ambulatory Visit: Payer: Self-pay

## 2019-04-16 ENCOUNTER — Encounter (HOSPITAL_COMMUNITY): Payer: Self-pay

## 2019-04-16 ENCOUNTER — Ambulatory Visit (HOSPITAL_COMMUNITY)
Admission: EM | Admit: 2019-04-16 | Discharge: 2019-04-16 | Disposition: A | Discharge status: Home / Self Care | Payer: 59 | Attending: Family Medicine | Admitting: Family Medicine

## 2019-04-16 ENCOUNTER — Ambulatory Visit (INDEPENDENT_AMBULATORY_CARE_PROVIDER_SITE_OTHER): Payer: 59

## 2019-04-16 DIAGNOSIS — Z885 Allergy status to narcotic agent status: Secondary | ICD-10-CM | POA: Diagnosis not present

## 2019-04-16 DIAGNOSIS — Z20822 Contact with and (suspected) exposure to covid-19: Secondary | ICD-10-CM | POA: Insufficient documentation

## 2019-04-16 DIAGNOSIS — I700 Atherosclerosis of aorta: Secondary | ICD-10-CM | POA: Diagnosis not present

## 2019-04-16 DIAGNOSIS — E119 Type 2 diabetes mellitus without complications: Secondary | ICD-10-CM | POA: Diagnosis not present

## 2019-04-16 DIAGNOSIS — J189 Pneumonia, unspecified organism: Secondary | ICD-10-CM

## 2019-04-16 DIAGNOSIS — G4730 Sleep apnea, unspecified: Secondary | ICD-10-CM | POA: Insufficient documentation

## 2019-04-16 DIAGNOSIS — E785 Hyperlipidemia, unspecified: Secondary | ICD-10-CM | POA: Diagnosis not present

## 2019-04-16 DIAGNOSIS — Z87891 Personal history of nicotine dependence: Secondary | ICD-10-CM | POA: Diagnosis not present

## 2019-04-16 DIAGNOSIS — R0602 Shortness of breath: Secondary | ICD-10-CM

## 2019-04-16 DIAGNOSIS — J45909 Unspecified asthma, uncomplicated: Secondary | ICD-10-CM | POA: Insufficient documentation

## 2019-04-16 DIAGNOSIS — I119 Hypertensive heart disease without heart failure: Secondary | ICD-10-CM | POA: Insufficient documentation

## 2019-04-16 DIAGNOSIS — R918 Other nonspecific abnormal finding of lung field: Secondary | ICD-10-CM | POA: Diagnosis not present

## 2019-04-16 MED ORDER — LEVOFLOXACIN 750 MG PO TABS: 750.0000 mg | ORAL_TABLET | Freq: Every day | ORAL | 0 refills | Status: DC

## 2019-04-16 NOTE — Discharge Instructions (Addendum)
You have been tested for COVID-19 today. °If your test returns positive, you will receive a phone call from  regarding your results. °Negative test results are not called. °Both positive and negative results area always visible on MyChart. °If you do not have a MyChart account, sign up instructions are provided in your discharge papers. °Please do not hesitate to contact us should you have questions or concerns. ° °

## 2019-04-16 NOTE — ED Triage Notes (Signed)
Pt presents with ongoing shortness of breath for past few days.

## 2019-04-18 LAB — NOVEL CORONAVIRUS, NAA (HOSP ORDER, SEND-OUT TO REF LAB; TAT 18-24 HRS): SARS-CoV-2, NAA: NOT DETECTED

## 2019-04-18 NOTE — ED Provider Notes (Addendum)
[facility]   [id number] 04/16/19 Arrival Time: [id number]  ASSESSMENT & PLAN:  1. Shortness of breath   2. Community acquired pneumonia of right lower lobe of lung     I have personally viewed the imaging studies ordered this visit. RLL infiltrate.  We are out of rapid COVID tests. PCR testing sent.  No indication for hospitalization at this time. She would like a trial of outpatient management.  To begin: Meds ordered this encounter  Medications   levofloxacin (LEVAQUIN) 750 MG tablet    Sig: Take 1 tablet (750 mg total) by mouth daily.    Dispense:  10 tablet    Refill:  0    ECG: Performed today and interpreted by me: NSR; no STEMI; no significant changes when compared to ECG dated 07/15/2017.   F/U precautions given.   Follow-up Information    [facility].   Specialty: Emergency Medicine Why: If symptoms worsen in any way. Contact information: [address] [phone] [place].   Specialty: Urgent Care Contact information: [place] [phone]         Upon discharge SpO2 at rest is 98% on RA.  Reviewed expectations re: course of current medical issues. Questions answered. Outlined signs and symptoms indicating need for more acute intervention. Patient verbalized understanding. After Visit Summary given.   SUBJECTIVE:  Shortness of Breath   History from: patient. Alexandria Ruiz is a 64 y.o. female who presents with complaint of cold symptoms; she thinks over the past week. Occasional cough and chest congestion. Feeling SOB more frequently over the past couple of days; worse with exertion; better with rest. Symptoms are interfering with sleep. No fever reported. Normal PO intake without n/v. No associated CP reported. Typical duration of symptoms when  present: unable to quantify. Denies: irregular heart beat, lower extremity edema, near-syncope, orthopnea, palpitations, paroxysmal nocturnal dyspnea and syncope. Ambulatory without assistance. Self/OTC treatment: none reported. Illicit drug use: none.  Social History   Tobacco Use  Smoking Status Former Smoker   Packs/day: 0.50   Years: 30.00   Pack years: 15.00   Quit date: 10/29/2006   Years since quitting: 12.4  Smokeless Tobacco Never Used   Social History   Substance and Sexual Activity  Alcohol Use No     OBJECTIVE:  Vitals:   04/16/19 1228  BP: (!) 158/87  Pulse: (!) 104  Resp: (!) 26  Temp: 98.9 F (37.2 C)  TempSrc: Oral  SpO2: 91%    General appearance: alert, oriented, no acute distress Eyes: PERRLA; EOMI; conjunctivae normal HENT: normocephalic; atraumatic Neck: supple with FROM Lungs: recheck RR; 20; without labored respirations; slightly decreased lung sounds at R base ; no rales Heart: regular rate and rhythm without murmer Abdomen: soft Extremities: without edema; without calf swelling or tenderness; symmetrical without gross deformities Skin: warm and dry; without visualized rash or lesions Psychological: alert and cooperative; normal mood and affect  Labs:  Labs Reviewed  NOVEL CORONAVIRUS, NAA (HOSP ORDER, SEND-OUT TO REF LAB; TAT 18-24 HRS)    Imaging: DG Chest 2 View  Result Date: 04/16/2019 CLINICAL DATA:  Shortness of breath EXAM: CHEST - 2 VIEW COMPARISON:  01/06/2016 FINDINGS: Heart size is enlarged. Aortic atherosclerosis. Scarring within the left midlung and right lower lobe identified, similar to previous exam. Airspace density within the anterior right lower  lobe noted which may reflect underlying pneumonia. This is a new abnormality when compared with previous exam. IMPRESSION: 1. Anterior right lower lobe airspace opacity concerning for pneumonia. 2. Followup PA and lateral chest X-ray is recommended in 3-4 weeks following  trial of antibiotic therapy to ensure resolution and exclude underlying malignancy. 3. Cardiac enlargement and Aortic Atherosclerosis (ICD10-I70.0). Electronically Signed   By: Kerby Moors M.D.   On: 04/16/2019 13:00     Allergies  Allergen Reactions   Hydrocodone Itching   Oxycodone Itching    Past Medical History:  Diagnosis Date   Abdominal pain    Allergic rhinitis    Anemia    Anxiety    Arthritis    Asthma    Bronchitis    Diabetes mellitus    type 2   GERD (gastroesophageal reflux disease)    Hyperlipidemia    Hypertension    Obesity    Peripheral neuropathy    denies has carpal tunnel   PONV (postoperative nausea and vomiting)    Shortness of breath dyspnea    since starting Trulicity diabetic med   Sleep apnea    uses c-pap machine   Social History   Socioeconomic History   Marital status: Married    Spouse name: Not on file   Number of children: Not on file   Years of education: Not on file   Highest education level: Not on file  Occupational History   Not on file  Tobacco Use   Smoking status: Former Smoker    Packs/day: 0.50    Years: 30.00    Pack years: 15.00    Quit date: 10/29/2006    Years since quitting: 12.4   Smokeless tobacco: Never Used  Substance and Sexual Activity   Alcohol use: No   Drug use: No   Sexual activity: Not Currently  Other Topics Concern   Not on file  Social History Narrative   Not on file   Social Determinants of Health   Financial Resource Strain:    Difficulty of Paying Living Expenses: Not on file  Food Insecurity:    Worried About Charity fundraiser in the Last Year: Not on file   [employer] of Food in the Last Year: Not on file  Transportation Needs:    Lack of Transportation (Medical): Not on file   Lack of Transportation (Non-Medical): Not on file  Physical Activity:    Days of Exercise per Week: Not on file   Minutes of Exercise per Session: Not on file    Stress:    Feeling of Stress : Not on file  Social Connections:    Frequency of Communication with Friends and Family: Not on file   Frequency of Social Gatherings with Friends and Family: Not on file   Attends Religious Services: Not on file   Active Member of Clubs or Organizations: Not on file   Attends Archivist Meetings: Not on file   Marital Status: Not on file  Intimate Partner Violence:    Fear of Current or Ex-Partner: Not on file   Emotionally Abused: Not on file   Physically Abused: Not on file   Sexually Abused: Not on file   Family History  Adopted: Yes   Past Surgical History:  Procedure Laterality Date   ABDOMINAL HYSTERECTOMY     BREAST REDUCTION SURGERY     HAMMER TOE SURGERY     bilateral feet   JOINT REPLACEMENT     R  TKA Dr. Wynelle Link 07-25-17   NOSE SURGERY     sinus surgery   ORIF ANKLE FRACTURE  06/29/2011   Procedure: OPEN REDUCTION INTERNAL FIXATION (ORIF) ANKLE FRACTURE;  Surgeon: Sharmon Revere, MD;  Location: [facility];  Service: Orthopedics;  Laterality: Right;   right rotator cuff surgery - 11-2013     STERIOD INJECTION Right 06/23/2015   Procedure: RIGHT KNEE CORTISONE INJECTION;  Surgeon: Gaynelle Arabian, MD;  Location: [facility];  Service: Orthopedics;  Laterality: Right;   TOTAL KNEE ARTHROPLASTY Left 06/23/2015   Procedure: TOTAL LEFT KNEE ARTHROPLASTY;  Surgeon: Gaynelle Arabian, MD;  Location: [facility];  Service: Orthopedics;  Laterality: Left;   TOTAL KNEE ARTHROPLASTY Right 07/25/2017   Procedure: RIGHT TOTAL KNEE ARTHROPLASTY;  Surgeon: Gaynelle Arabian, MD;  Location: [facility];  Service: Orthopedics;  Laterality: Right;  Adductor Block     Vanessa Kick, MD 04/18/19 [id number]    Vanessa Kick, MD 04/18/19 [phone]

## 2019-05-23 ENCOUNTER — Ambulatory Visit (HOSPITAL_COMMUNITY)
Admission: EM | Admit: 2019-05-23 | Discharge: 2019-05-23 | Disposition: A | Discharge status: Home / Self Care | Payer: 59 | Source: Home / Self Care | Attending: Internal Medicine | Admitting: Internal Medicine

## 2019-05-23 ENCOUNTER — Emergency Department (HOSPITAL_COMMUNITY)
Admission: EM | Admit: 2019-05-23 | Discharge: 2019-05-24 | Disposition: A | Discharge status: Home / Self Care | Payer: 59 | Attending: Emergency Medicine | Admitting: Emergency Medicine

## 2019-05-23 ENCOUNTER — Encounter (HOSPITAL_COMMUNITY): Payer: Self-pay

## 2019-05-23 ENCOUNTER — Other Ambulatory Visit: Payer: Self-pay

## 2019-05-23 ENCOUNTER — Emergency Department (HOSPITAL_COMMUNITY): Payer: 59

## 2019-05-23 ENCOUNTER — Ambulatory Visit (INDEPENDENT_AMBULATORY_CARE_PROVIDER_SITE_OTHER): Payer: 59

## 2019-05-23 DIAGNOSIS — J189 Pneumonia, unspecified organism: Secondary | ICD-10-CM | POA: Diagnosis not present

## 2019-05-23 DIAGNOSIS — E1142 Type 2 diabetes mellitus with diabetic polyneuropathy: Secondary | ICD-10-CM | POA: Diagnosis not present

## 2019-05-23 DIAGNOSIS — R7989 Other specified abnormal findings of blood chemistry: Secondary | ICD-10-CM | POA: Diagnosis not present

## 2019-05-23 DIAGNOSIS — Z79899 Other long term (current) drug therapy: Secondary | ICD-10-CM | POA: Diagnosis not present

## 2019-05-23 DIAGNOSIS — Z87891 Personal history of nicotine dependence: Secondary | ICD-10-CM | POA: Diagnosis not present

## 2019-05-23 DIAGNOSIS — R5383 Other fatigue: Secondary | ICD-10-CM

## 2019-05-23 DIAGNOSIS — Z885 Allergy status to narcotic agent status: Secondary | ICD-10-CM | POA: Diagnosis not present

## 2019-05-23 DIAGNOSIS — I509 Heart failure, unspecified: Secondary | ICD-10-CM

## 2019-05-23 DIAGNOSIS — I110 Hypertensive heart disease with heart failure: Secondary | ICD-10-CM | POA: Diagnosis not present

## 2019-05-23 DIAGNOSIS — R0602 Shortness of breath: Secondary | ICD-10-CM

## 2019-05-23 DIAGNOSIS — G4733 Obstructive sleep apnea (adult) (pediatric): Secondary | ICD-10-CM | POA: Insufficient documentation

## 2019-05-23 DIAGNOSIS — Z7982 Long term (current) use of aspirin: Secondary | ICD-10-CM | POA: Insufficient documentation

## 2019-05-23 DIAGNOSIS — Z20822 Contact with and (suspected) exposure to covid-19: Secondary | ICD-10-CM | POA: Insufficient documentation

## 2019-05-23 DIAGNOSIS — Z96653 Presence of artificial knee joint, bilateral: Secondary | ICD-10-CM | POA: Diagnosis not present

## 2019-05-23 LAB — BRAIN NATRIURETIC PEPTIDE: B Natriuretic Peptide: 713.2 pg/mL — ABNORMAL HIGH (ref 0.0–100.0)

## 2019-05-23 LAB — COMPREHENSIVE METABOLIC PANEL WITH GFR
ALT: 44 U/L (ref 0–44)
AST: 48 U/L — ABNORMAL HIGH (ref 15–41)
Albumin: 3.4 g/dL — ABNORMAL LOW (ref 3.5–5.0)
Alkaline Phosphatase: 125 U/L (ref 38–126)
Anion gap: 11 (ref 5–15)
BUN: 14 mg/dL (ref 8–23)
CO2: 21 mmol/L — ABNORMAL LOW (ref 22–32)
Calcium: 8.9 mg/dL (ref 8.9–10.3)
Chloride: 108 mmol/L (ref 98–111)
Creatinine, Ser: 0.92 mg/dL (ref 0.44–1.00)
GFR calc Af Amer: >60 mL/min
GFR calc non Af Amer: >60 mL/min
Glucose, Bld: 144 mg/dL — ABNORMAL HIGH (ref 70–99)
Potassium: 3.4 mmol/L — ABNORMAL LOW (ref 3.5–5.1)
Sodium: 140 mmol/L (ref 135–145)
Total Bilirubin: 2.5 mg/dL — ABNORMAL HIGH (ref 0.3–1.2)
Total Protein: 6.7 g/dL (ref 6.5–8.1)

## 2019-05-23 LAB — CBC WITH DIFFERENTIAL/PLATELET
Abs Immature Granulocytes: 0.04 10*3/uL (ref 0.00–0.07)
Basophils Absolute: 0.1 10*3/uL (ref 0.0–0.1)
Basophils Relative: 1 %
Eosinophils Absolute: 0.2 10*3/uL (ref 0.0–0.5)
Eosinophils Relative: 3 %
HCT: 42.9 % (ref 36.0–46.0)
Hemoglobin: 13.2 g/dL (ref 12.0–15.0)
Immature Granulocytes: 0 %
Lymphocytes Relative: 31 %
Lymphs Abs: 3 10*3/uL (ref 0.7–4.0)
MCH: 27.5 pg (ref 26.0–34.0)
MCHC: 30.8 g/dL (ref 30.0–36.0)
MCV: 89.4 fL (ref 80.0–100.0)
Monocytes Absolute: 1.3 10*3/uL — ABNORMAL HIGH (ref 0.1–1.0)
Monocytes Relative: 13 %
Neutro Abs: 5.1 10*3/uL (ref 1.7–7.7)
Neutrophils Relative %: 52 %
Platelets: 320 10*3/uL (ref 150–400)
RBC: 4.8 MIL/uL (ref 3.87–5.11)
RDW: 17.3 % — ABNORMAL HIGH (ref 11.5–15.5)
WBC: 9.7 10*3/uL (ref 4.0–10.5)
nRBC: 0 % (ref 0.0–0.2)

## 2019-05-23 MED ORDER — IOHEXOL 350 MG/ML SOLN
100.0000 mL | Freq: Once | INTRAVENOUS | Status: AC | PRN
  Administered 2019-05-23: 20:00:00 100 mL via INTRAVENOUS

## 2019-05-23 MED ORDER — POTASSIUM CHLORIDE CRYS ER 20 MEQ PO TBCR
40.0000 meq | EXTENDED_RELEASE_TABLET | Freq: Once | ORAL | Status: AC
  Administered 2019-05-23: 40 meq via ORAL
  Filled 2019-05-23: qty 2

## 2019-05-23 MED ORDER — SODIUM CHLORIDE 0.9 % IV SOLN
1.0000 g | Freq: Once | INTRAVENOUS | Status: AC
  Administered 2019-05-23: 22:00:00 1 g via INTRAVENOUS
  Filled 2019-05-23: qty 10

## 2019-05-23 MED ORDER — FUROSEMIDE 10 MG/ML IJ SOLN
40.0000 mg | Freq: Once | INTRAMUSCULAR | Status: AC
  Administered 2019-05-23: 40 mg via INTRAVENOUS
  Filled 2019-05-23: qty 4

## 2019-05-23 NOTE — ED Notes (Signed)
PT currently using restroom

## 2019-05-23 NOTE — ED Notes (Signed)
Pt transported to CT ?

## 2019-05-23 NOTE — ED Triage Notes (Signed)
Pt states she has SOB sense February 2021 . Pt states she has been like this sense her last visit here in February.

## 2019-05-23 NOTE — ED Provider Notes (Signed)
[facility]    CSN: [id number] Arrival date & time: 05/23/19  1124      History   Chief Complaint Chief Complaint  Patient presents with  . Shortness of Breath    HPI Alexandria Ruiz is a 64 y.o. female with a history of diabetes mellitus type 2, obstructive sleep apnea on CPAP recently treated for pneumonia comes to urgent care with complaints of increasingly worsening shortness of breath both at rest and with activity.  Patient said symptoms have been progressive over the past few weeks and she has not noticed any improvement even after she was treated with a course of antibiotics.  She denies any orthopnea or paroxysmal nocturnal dyspnea.  She admits to having some lower extremity swelling.  No chest pain or chest pressure.  Patient is not on diuretics and has not been diagnosed with congestive heart failure in the past.  No dizziness, near syncope or syncopal episodes.  Patient denies any fever or chills.  No cough or sputum production.  HPI  Past Medical History:  Diagnosis Date  . Abdominal pain   . Allergic rhinitis   . Anemia   . Anxiety   . Arthritis   . Asthma   . Bronchitis   . Diabetes mellitus    type 2  . GERD (gastroesophageal reflux disease)   . Hyperlipidemia   . Hypertension   . Obesity   . Peripheral neuropathy    denies has carpal tunnel  . PONV (postoperative nausea and vomiting)   . Shortness of breath dyspnea    since starting Trulicity diabetic med  . Sleep apnea    uses c-pap machine    Patient Active Problem List   Diagnosis Date Noted  . Dyspnea 04/08/2016  . Asthma 04/08/2016  . OA (osteoarthritis) of knee 06/23/2015  . LVH (left ventricular hypertrophy) 01/01/2014  . Fracture of right ankle 06/30/2011  . Hypertension   . RHINOSINUSITIS, CHRONIC 08/20/2009  . DIABETES MELLITUS 08/19/2009  . HYPERLIPIDEMIA 08/19/2009  . Anemia 08/19/2009  . ANXIETY 08/19/2009  . HYPERTENSION 08/19/2009  . GERD 08/19/2009  . PRURITUS  08/19/2009  . CERVICALGIA 08/19/2009  . LOW BACK PAIN, CHRONIC 08/19/2009    Past Surgical History:  Procedure Laterality Date  . ABDOMINAL HYSTERECTOMY    . BREAST REDUCTION SURGERY    . HAMMER TOE SURGERY     bilateral feet  . JOINT REPLACEMENT     R TKA Dr. Wynelle Link 07-25-17  . NOSE SURGERY     sinus surgery  . ORIF ANKLE FRACTURE  06/29/2011   Procedure: OPEN REDUCTION INTERNAL FIXATION (ORIF) ANKLE FRACTURE;  Surgeon: Sharmon Revere, MD;  Location: [facility];  Service: Orthopedics;  Laterality: Right;  . right rotator cuff surgery - 11-2013    . STERIOD INJECTION Right 06/23/2015   Procedure: RIGHT KNEE CORTISONE INJECTION;  Surgeon: Gaynelle Arabian, MD;  Location: [facility];  Service: Orthopedics;  Laterality: Right;  . TOTAL KNEE ARTHROPLASTY Left 06/23/2015   Procedure: TOTAL LEFT KNEE ARTHROPLASTY;  Surgeon: Gaynelle Arabian, MD;  Location: [facility];  Service: Orthopedics;  Laterality: Left;  . TOTAL KNEE ARTHROPLASTY Right 07/25/2017   Procedure: RIGHT TOTAL KNEE ARTHROPLASTY;  Surgeon: Gaynelle Arabian, MD;  Location: [facility];  Service: Orthopedics;  Laterality: Right;  Adductor Block    OB History   No obstetric history on file.      Home Medications    Prior to Admission medications   Medication Sig Start Date End Date  Taking? Authorizing Provider  albuterol (PROVENTIL HFA;VENTOLIN HFA) 108 (90 BASE) MCG/ACT inhaler Inhale 2 puffs into the lungs every 6 (six) hours as needed for wheezing.    [provider]  amLODipine-olmesartan (AZOR) 5-40 MG per tablet Take 1 tablet by mouth daily.    [provider]  aspirin EC 325 MG EC tablet Take 1 tablet (325 mg total) by mouth 2 (two) times daily. 07/26/17   Constable, Amber, PA-C  atorvastatin (LIPITOR) 40 MG tablet Take 40 mg by mouth daily.    [provider]  cetirizine (ZYRTEC) 10 MG tablet Take 10 mg by mouth every morning.    [provider]  diclofenac (VOLTAREN) 75 MG EC tablet Take 75 mg by mouth 2  (two) times daily. 05/12/17   [provider]  escitalopram (LEXAPRO) 10 MG tablet Take 10 mg by mouth every morning.    [provider]  Eszopiclone 3 MG TABS Take 3 mg by mouth at bedtime as needed. 06/30/17   [provider]  FERREX 150 150 MG capsule Take 150 mg by mouth 2 (two) times daily. 07/04/17   [provider]  gabapentin (NEURONTIN) 300 MG capsule Take 1 capsule (300 mg total) by mouth 3 (three) times daily. Gabapentin 300 mg Protocol Take a 300 mg capsule three times a day for two weeks, Then a 300 mg capsule twice a day for two weeks, Then a 300 mg capsule once a day for two weeks, then discontinue the Gabapentin. 07/25/17   Constable, Amber, PA-C  hydrOXYzine (ATARAX/VISTARIL) 25 MG tablet Take 25 mg by mouth 3 (three) times daily as needed for itching.     [provider]  levofloxacin (LEVAQUIN) 750 MG tablet Take 1 tablet (750 mg total) by mouth daily. 04/16/19   Vanessa Kick, MD  methocarbamol (ROBAXIN) 500 MG tablet Take 1 tablet (500 mg total) by mouth every 6 (six) hours as needed for muscle spasms. 07/25/17   Constable, Amber, PA-C  oxyCODONE (OXY IR/ROXICODONE) 5 MG immediate release tablet Take 1-2 tablets (5-10 mg total) by mouth every 6 (six) hours as needed for moderate pain or severe pain. 07/27/17   Constable, Amber, PA-C  PENNSAID 2 % SOLN Apply 2 (two) pumps topically to affected area(s) twice a day 06/29/17   [provider]  RABEprazole (ACIPHEX) 20 MG tablet Take 20 mg by mouth 2 (two) times daily.     [provider]  SPIRIVA RESPIMAT 1.25 MCG/ACT AERS  03/10/16   [provider]  traMADol (ULTRAM) 50 MG tablet Take 1-2 tablets (50-100 mg total) by mouth every 6 (six) hours as needed for moderate pain (if hydromorphone ineffective). 07/25/17   Constable, Amber, PA-C  triamcinolone cream (KENALOG) 0.1 % 1 APPLICATION TO AFFECTED AREA TWICE A DAY, SPARRINGLY AS NEEDED FOR RASH EXTERNALLY 30 DAYS 06/01/17    [provider]  TRULICITY 1.5 0000000 SOPN Inject 0.5 mLs into the skin once a week. On Saturdays 06/26/17   [provider]  valACYclovir (VALTREX) 1000 MG tablet Take 1,000 mg by mouth daily.  12/25/13   [provider]  VYVANSE 70 MG capsule Take 70 mg by mouth daily. 06/08/17   [provider]    Family History Family History  Adopted: Yes    Social History Social History   Tobacco Use  . Smoking status: Former Smoker    Packs/day: 0.50    Years: 30.00    Pack years: 15.00    Quit date: 10/29/2006  Years since quitting: 12.5  . Smokeless tobacco: Never Used  Substance Use Topics  . Alcohol use: No  . Drug use: No     Allergies   Hydrocodone and Oxycodone   Review of Systems Review of Systems  Constitutional: Positive for activity change. Negative for chills, fatigue and fever.  HENT: Negative.   Respiratory: Positive for shortness of breath. Negative for chest tightness and wheezing.   Cardiovascular: Positive for leg swelling. Negative for chest pain and palpitations.  Gastrointestinal: Negative for abdominal pain, diarrhea, nausea and vomiting.  Genitourinary: Negative.   Musculoskeletal: Negative for arthralgias and gait problem.  Skin: Negative.   Neurological: Negative for dizziness, light-headedness and headaches.  Psychiatric/Behavioral: Negative for confusion and decreased concentration.  All other systems reviewed and are negative.    Physical Exam Triage Vital Signs ED Triage Vitals  Enc Vitals Group     BP 05/23/19 1144 (!) 159/97     Pulse Rate 05/23/19 1144 96     Resp 05/23/19 1144 20     Temp 05/23/19 1144 99.2 F (37.3 C)     Temp Source 05/23/19 1144 Oral     SpO2 05/23/19 1144 98 %     Weight 05/23/19 1145 298 lb (135.2 kg)     Height --      Head Circumference --      Peak Flow --      Pain Score 05/23/19 1249 0     Pain Loc --      Pain Edu? --      Excl. in [place]? --    No data  found.  Updated Vital Signs BP (!) 159/97 (BP Location: Right Arm)   Pulse 96   Temp 99.2 F (37.3 C) (Oral)   Resp 20   Wt 135.2 kg   SpO2 98%   BMI 41.56 kg/m   Visual Acuity Right Eye Distance:   Left Eye Distance:   Bilateral Distance:    Right Eye Near:   Left Eye Near:    Bilateral Near:     Physical Exam Vitals and nursing note reviewed.  Constitutional:      General: She is in acute distress.     Appearance: She is obese. She is ill-appearing.  Neck:     Thyroid: No thyromegaly.     Vascular: No hepatojugular reflux.     Trachea: No tracheal deviation.  Cardiovascular:     Rate and Rhythm: Normal rate and regular rhythm.  Pulmonary:     Breath sounds: Decreased breath sounds present. No wheezing, rhonchi or rales.  Chest:     Chest wall: No mass, crepitus or edema.  Abdominal:     Palpations: Abdomen is soft. There is no hepatomegaly or splenomegaly.  Musculoskeletal:     Cervical back: Normal range of motion.     Right lower leg: Edema present.     Left lower leg: Edema present.  Skin:    General: Skin is warm.     Capillary Refill: Capillary refill takes less than 2 seconds.  Neurological:     General: No focal deficit present.     Mental Status: She is alert.      UC Treatments / Results  Labs (all labs ordered are listed, but only abnormal results are displayed) Labs Reviewed - No data to display  EKG   Radiology DG Chest 2 View  Result Date: 05/23/2019 CLINICAL DATA:  Short of breath and fatigue. EXAM: CHEST - 2 VIEW COMPARISON:  04/16/2019 FINDINGS: Aortic atherosclerosis. Moderate cardiac enlargement is identified. Linear opacities within both lower lung zones and left midlung are again noted compatible with scarring and platelike atelectasis. Persistent opacity within the anterior right lower lobe is identified. IMPRESSION: 1. Persistent anterior right lower lobe opacity is noted. Findings are indeterminate. If the patient remains  symptomatic recommend further evaluation with CT of the chest to assess for underlying lung lesion. 2. Cardiac enlargement 3.  Aortic Atherosclerosis (ICD10-I70.0). Electronically Signed   By: Kerby Moors M.D.   On: 05/23/2019 12:31    Procedures Procedures (including critical care time)  Medications Ordered in UC Medications - No data to display  Initial Impression / Assessment and Plan / UC Course  I have reviewed the triage vital signs and the nursing notes.  Pertinent labs & imaging results that were available during my care of the patient were reviewed by me and considered in my medical decision making (see chart for details).     1.  Congestive heart failure NYHA class III, unspecified type: Patient's chest x-ray is remarkable for cardiomegaly and right lung opacity which is persistent for weeks after being treated for pneumonia.  Patient is clinically fluid overloaded.  She carries no formal diagnosis of congestive heart failure.  Patient will need hematologic/biochemical work-up.  Patient is advised to go to emergency department for further work-up and possibly in hospital stay for diuresis and further work-up of right lung capacity. Patient is agreeable to going to [facility] long hospital. Final Clinical Impressions(s) / UC Diagnoses   Final diagnoses:  Congestive heart failure, NYHA class 3, unspecified congestive heart failure type [facility])   Discharge Instructions   None    ED Prescriptions    None     PDMP not reviewed this encounter.   Chase Picket, MD 05/23/19 [phone]

## 2019-05-23 NOTE — ED Triage Notes (Signed)
Pt reports that she had increasingly worse SOb since Feb. Was seen at [facility] today and advised to go to the Ed for further eval.

## 2019-05-24 LAB — SARS CORONAVIRUS 2 (TAT 6-24 HRS): SARS Coronavirus 2: NEGATIVE

## 2019-05-24 MED ORDER — POTASSIUM CHLORIDE ER 10 MEQ PO TBCR: 10.0000 meq | EXTENDED_RELEASE_TABLET | Freq: Every day | ORAL | 0 refills | Status: DC

## 2019-05-24 MED ORDER — AZITHROMYCIN 250 MG PO TABS: 250.0000 mg | ORAL_TABLET | Freq: Every day | ORAL | 0 refills | Status: DC

## 2019-05-24 MED ORDER — FUROSEMIDE 20 MG PO TABS: 20.0000 mg | ORAL_TABLET | Freq: Every day | ORAL | 0 refills | Status: DC

## 2019-05-24 NOTE — ED Provider Notes (Signed)
[facility] DEPT Provider Note   CSN: [id number] Arrival date & time: 05/23/19  1650     History Chief Complaint  Patient presents with  . Shortness of Breath    Alexandria Ruiz is a 64 y.o. female.  Patient sent in for urgent care for shortness of breath since February.  Patient states that he has been getting worse.  Beginning in February patient was treated for a right lower lobe pneumonia.  Patient's Covid test at that time was negative.  Patient feels as if she has had some bilateral leg swelling.  Denies any chest pain.  No fevers no cough.  Patient in the in February was treated with Levaquin for that pneumonia.        Past Medical History:  Diagnosis Date  . Abdominal pain   . Allergic rhinitis   . Anemia   . Anxiety   . Arthritis   . Asthma   . Bronchitis   . Diabetes mellitus    type 2  . GERD (gastroesophageal reflux disease)   . Hyperlipidemia   . Hypertension   . Obesity   . Peripheral neuropathy    denies has carpal tunnel  . PONV (postoperative nausea and vomiting)   . Shortness of breath dyspnea    since starting Trulicity diabetic med  . Sleep apnea    uses c-pap machine    Patient Active Problem List   Diagnosis Date Noted  . Dyspnea 04/08/2016  . Asthma 04/08/2016  . OA (osteoarthritis) of knee 06/23/2015  . LVH (left ventricular hypertrophy) 01/01/2014  . Fracture of right ankle 06/30/2011  . Hypertension   . RHINOSINUSITIS, CHRONIC 08/20/2009  . DIABETES MELLITUS 08/19/2009  . HYPERLIPIDEMIA 08/19/2009  . Anemia 08/19/2009  . ANXIETY 08/19/2009  . HYPERTENSION 08/19/2009  . GERD 08/19/2009  . PRURITUS 08/19/2009  . CERVICALGIA 08/19/2009  . LOW BACK PAIN, CHRONIC 08/19/2009    Past Surgical History:  Procedure Laterality Date  . ABDOMINAL HYSTERECTOMY    . BREAST REDUCTION SURGERY    . HAMMER TOE SURGERY     bilateral feet  . JOINT REPLACEMENT     R TKA Dr. Wynelle Link 07-25-17  . NOSE SURGERY      sinus surgery  . ORIF ANKLE FRACTURE  06/29/2011   Procedure: OPEN REDUCTION INTERNAL FIXATION (ORIF) ANKLE FRACTURE;  Surgeon: Sharmon Revere, MD;  Location: [facility];  Service: Orthopedics;  Laterality: Right;  . right rotator cuff surgery - 11-2013    . STERIOD INJECTION Right 06/23/2015   Procedure: RIGHT KNEE CORTISONE INJECTION;  Surgeon: Gaynelle Arabian, MD;  Location: [facility];  Service: Orthopedics;  Laterality: Right;  . TOTAL KNEE ARTHROPLASTY Left 06/23/2015   Procedure: TOTAL LEFT KNEE ARTHROPLASTY;  Surgeon: Gaynelle Arabian, MD;  Location: [facility];  Service: Orthopedics;  Laterality: Left;  . TOTAL KNEE ARTHROPLASTY Right 07/25/2017   Procedure: RIGHT TOTAL KNEE ARTHROPLASTY;  Surgeon: Gaynelle Arabian, MD;  Location: [facility];  Service: Orthopedics;  Laterality: Right;  Adductor Block     OB History   No obstetric history on file.     Family History  Adopted: Yes    Social History   Tobacco Use  . Smoking status: Former Smoker    Packs/day: 0.50    Years: 30.00    Pack years: 15.00    Quit date: 10/29/2006    Years since quitting: 12.5  . Smokeless tobacco: Never Used  Substance Use Topics  . Alcohol use: No  .  Drug use: No    Home Medications Prior to Admission medications   Medication Sig Start Date End Date Taking? Authorizing Provider  albuterol (PROVENTIL HFA;VENTOLIN HFA) 108 (90 BASE) MCG/ACT inhaler Inhale 2 puffs into the lungs every 6 (six) hours as needed for wheezing.   Yes [provider]  albuterol (PROVENTIL) (2.5 MG/3ML) 0.083% nebulizer solution Take 2.5 mg by nebulization every 4 (four) hours as needed for wheezing or shortness of breath.  04/20/19  Yes [provider]  ALPRAZolam Duanne Moron) 0.5 MG tablet Take 0.25 mg by mouth daily as needed for anxiety.  04/22/19  Yes [provider]  amLODipine-olmesartan (AZOR) 5-40 MG per tablet Take 1 tablet by mouth daily.   Yes [provider]  aspirin EC 325 MG EC tablet Take 1 tablet  (325 mg total) by mouth 2 (two) times daily. Patient taking differently: Take 325 mg by mouth daily.  07/26/17  Yes Constable, Amber, PA-C  atorvastatin (LIPITOR) 40 MG tablet Take 40 mg by mouth 3 (three) times a week. Take on MWF Only   Yes [provider]  budesonide-formoterol (SYMBICORT) 160-4.5 MCG/ACT inhaler Inhale 2 puffs into the lungs 2 (two) times daily. 03/15/19  Yes [provider]  cetirizine (ZYRTEC) 10 MG tablet Take 10 mg by mouth every morning.   Yes [provider]  DULoxetine (CYMBALTA) 60 MG capsule Take 60 mg by mouth 2 (two) times daily. 03/22/19  Yes [provider]  FERREX 150 150 MG capsule Take 150 mg by mouth 2 (two) times daily. 07/04/17  Yes [provider]  PROAIR RESPICLICK 123XX123 (90 Base) MCG/ACT AEPB Inhale 2 puffs into the lungs every 4 (four) hours as needed (sob and wheezing).  04/20/19  Yes [provider]  RABEprazole (ACIPHEX) 20 MG tablet Take 20 mg by mouth 2 (two) times daily.    Yes [provider]  SPIRIVA RESPIMAT 1.25 MCG/ACT AERS Take 1.25 mcg by mouth daily.  03/10/16  Yes [provider]  sucralfate (CARAFATE) 1 g tablet Take 1 g by mouth 4 (four) times daily. 05/07/19  Yes [provider]  triamcinolone cream (KENALOG) 0.1 % Apply 1 application topically 2 (two) times daily as needed (rash).  06/01/17  Yes [provider]  TRULICITY 1.5 0000000 SOPN Inject 0.5 mLs into the skin once a week. On Saturdays 06/26/17  Yes [provider]  valACYclovir (VALTREX) 1000 MG tablet Take 1,000 mg by mouth daily.  12/25/13  Yes [provider]  VYVANSE 70 MG capsule Take 70 mg by mouth daily as needed (diet control).  06/08/17  Yes [provider]  azithromycin (ZITHROMAX) 250 MG tablet Take 1 tablet (250 mg total) by mouth daily. Take first 2 tablets together, then 1 every day until finished. 05/24/19   Fredia Sorrow, MD  furosemide (LASIX) 20 MG tablet  Take 1 tablet (20 mg total) by mouth daily. 05/24/19   Fredia Sorrow, MD  gabapentin (NEURONTIN) 300 MG capsule Take 1 capsule (300 mg total) by mouth 3 (three) times daily. Gabapentin 300 mg Protocol Take a 300 mg capsule three times a day for two weeks, Then a 300 mg capsule twice a day for two weeks, Then a 300 mg capsule once a day for two weeks, then discontinue the Gabapentin. Patient not taking: Reported on 05/23/2019 07/25/17   Ardeen Jourdain, PA-C  levofloxacin (LEVAQUIN) 750 MG tablet Take 1 tablet (750 mg total) by mouth daily. Patient not taking: Reported on 05/23/2019 04/16/19  Vanessa Kick, MD  methocarbamol (ROBAXIN) 500 MG tablet Take 1 tablet (500 mg total) by mouth every 6 (six) hours as needed for muscle spasms. Patient not taking: Reported on 05/23/2019 07/25/17   Ardeen Jourdain, PA-C  oxyCODONE (OXY IR/ROXICODONE) 5 MG immediate release tablet Take 1-2 tablets (5-10 mg total) by mouth every 6 (six) hours as needed for moderate pain or severe pain. Patient not taking: Reported on 05/23/2019 07/27/17   Ardeen Jourdain, PA-C  potassium chloride (KLOR-CON) 10 MEQ tablet Take 1 tablet (10 mEq total) by mouth daily. 05/24/19   Fredia Sorrow, MD  traMADol (ULTRAM) 50 MG tablet Take 1-2 tablets (50-100 mg total) by mouth every 6 (six) hours as needed for moderate pain (if hydromorphone ineffective). Patient not taking: Reported on 05/23/2019 07/25/17   Ardeen Jourdain, PA-C    Allergies    Hydrocodone and Oxycodone  Review of Systems   Review of Systems  Constitutional: Negative for chills and fever.  HENT: Negative for congestion, rhinorrhea and sore throat.   Eyes: Negative for visual disturbance.  Respiratory: Positive for shortness of breath. Negative for cough.   Cardiovascular: Positive for leg swelling. Negative for chest pain.  Gastrointestinal: Negative for abdominal pain, diarrhea, nausea and vomiting.  Genitourinary: Negative for dysuria.  Musculoskeletal:  Negative for back pain and neck pain.  Skin: Negative for rash.  Neurological: Negative for dizziness, light-headedness and headaches.  Hematological: Does not bruise/bleed easily.  Psychiatric/Behavioral: Negative for confusion.    Physical Exam Updated Vital Signs BP (!) 157/87 (BP Location: Left Arm)   Pulse (!) 110   Temp 98.1 F (36.7 C) (Oral)   Resp (!) 29   SpO2 98%   Physical Exam Vitals and nursing note reviewed.  Constitutional:      General: She is not in acute distress.    Appearance: Normal appearance. She is well-developed.  HENT:     Head: Normocephalic and atraumatic.  Eyes:     Extraocular Movements: Extraocular movements intact.     Conjunctiva/sclera: Conjunctivae normal.     Pupils: Pupils are equal, round, and reactive to light.  Cardiovascular:     Rate and Rhythm: Normal rate and regular rhythm.     Heart sounds: No murmur.  Pulmonary:     Effort: Pulmonary effort is normal. No respiratory distress.     Breath sounds: Normal breath sounds.  Abdominal:     Palpations: Abdomen is soft.     Tenderness: There is no abdominal tenderness.  Musculoskeletal:        General: Normal range of motion.     Cervical back: Normal range of motion and neck supple.     Right lower leg: Edema present.     Left lower leg: Edema present.     Comments: Very slight lower extremity edema.  Bilaterally and equal.  Skin:    General: Skin is warm and dry.     Capillary Refill: Capillary refill takes less than 2 seconds.  Neurological:     General: No focal deficit present.     Mental Status: She is alert and oriented to person, place, and time.     ED Results / Procedures / Treatments   Labs (all labs ordered are listed, but only abnormal results are displayed) Labs Reviewed  BRAIN NATRIURETIC PEPTIDE - Abnormal; Notable for the following components:      Result Value   B Natriuretic Peptide 713.2 (*)    All other components within normal limits  COMPREHENSIVE  METABOLIC  PANEL - Abnormal; Notable for the following components:   Potassium 3.4 (*)    CO2 21 (*)    Glucose, Bld 144 (*)    Albumin 3.4 (*)    AST 48 (*)    Total Bilirubin 2.5 (*)    All other components within normal limits  CBC WITH DIFFERENTIAL/PLATELET - Abnormal; Notable for the following components:   RDW 17.3 (*)    Monocytes Absolute 1.3 (*)    All other components within normal limits  SARS CORONAVIRUS 2 (TAT 6-24 HRS)    EKG EKG Interpretation  Date/Time:  Wednesday May 23 2019 17:02:11 EST Ventricular Rate:  103 PR Interval:    QRS Duration: 83 QT Interval:  370 QTC Calculation: 485 R Axis:   32 Text Interpretation: Sinus tachycardia Probable left atrial enlargement Nonspecific T abnormalities, lateral leads 12 Lead; Mason-Likar Confirmed by Fredia Sorrow [phone]) on 05/23/2019 5:22:01 PM   Radiology DG Chest 2 View  Result Date: 05/23/2019 CLINICAL DATA:  Short of breath and fatigue. EXAM: CHEST - 2 VIEW COMPARISON:  04/16/2019 FINDINGS: Aortic atherosclerosis. Moderate cardiac enlargement is identified. Linear opacities within both lower lung zones and left midlung are again noted compatible with scarring and platelike atelectasis. Persistent opacity within the anterior right lower lobe is identified. IMPRESSION: 1. Persistent anterior right lower lobe opacity is noted. Findings are indeterminate. If the patient remains symptomatic recommend further evaluation with CT of the chest to assess for underlying lung lesion. 2. Cardiac enlargement 3.  Aortic Atherosclerosis (ICD10-I70.0). Electronically Signed   By: Kerby Moors M.D.   On: 05/23/2019 12:31   CT Angio Chest PE W/Cm &/Or Wo Cm  Result Date: 05/23/2019 CLINICAL DATA:  Shortness of breath.  Pulmonary embolism suspected. EXAM: CT ANGIOGRAPHY CHEST WITH CONTRAST TECHNIQUE: Multidetector CT imaging of the chest was performed using the standard protocol during bolus administration of intravenous contrast.  Multiplanar CT image reconstructions and MIPs were obtained to evaluate the vascular anatomy. CONTRAST:  188mL OMNIPAQUE IOHEXOL 350 MG/ML SOLN COMPARISON:  Chest radiography same day.  CT angiography 01/16/2016. FINDINGS: Cardiovascular: Pulmonary arterial opacification is moderate in the upper lobes and good in the lower lobes, based on early bolus timing. I do not see any pulmonary emboli. Think the study affectively excludes large central emboli. Heart is enlarged. No pericardial effusion. There is aortic atherosclerosis. Mediastinum/Nodes: No mass or lymphadenopathy. Lungs/Pleura: Small pleural effusion on the right. Mild atelectasis or infiltrate in the right middle lobe. Scattered areas of atelectasis or scarring in the lower lobes. Upper Abdomen: Right renal cyst Musculoskeletal: Ordinary thoracic degenerative changes. Review of the MIP images confirms the above findings. IMPRESSION: Small right effusion with dependent pulmonary atelectasis on the right. Cardiomegaly. No visible pulmonary emboli. Early contrast bolus timing. I think this study excludes large central emboli. I do not see positive evidence of a small peripheral embolus. Newly seen atelectasis/infiltrate in the right middle lobe. Linear atelectasis or scarring in the lower lobes, slightly more prominent than noted in 2017. Aortic Atherosclerosis (ICD10-I70.0). Electronically Signed   By: Nelson Chimes M.D.   On: 05/23/2019 20:36    Procedures Procedures (including critical care time)  Medications Ordered in ED Medications  iohexol (OMNIPAQUE) 350 MG/ML injection 100 mL (100 mLs Intravenous Contrast Given 05/23/19 2020)  potassium chloride SA (KLOR-CON) CR tablet 40 mEq (40 mEq Oral Given 05/23/19 2141)  furosemide (LASIX) injection 40 mg (40 mg Intravenous Given 05/23/19 2144)  cefTRIAXone (ROCEPHIN) 1 g in sodium chloride 0.9 % 100  mL IVPB (0 g Intravenous Stopped 05/23/19 2347)    ED Course  I have reviewed the triage vital signs  and the nursing notes.  Pertinent labs & imaging results that were available during my care of the patient were reviewed by me and considered in my medical decision making (see chart for details).    MDM Rules/Calculators/A&P                      Patient with extensive work-up for the shortness of breath.  CT angio was done.  No evidence of any blood clots.  No evidence of any pulmonary edema raised concern for right middle lobe pneumonia.  Back in February it was a right lower lobe pneumonia.  Covid testing is pending.  Patient's BNP was elevated potassium just slightly below 3.4.  Patient was given some Lasix and some potassium.  Patient ambulated with pulse ox sat below 90%.  No significant shortness of breath.  We will discharge home with a low-dose of Lasix for 2 weeks have her follow-up with cardiology and her primary care doctor.  Have her take some potassium for a few days.  We will start her on a Z-Pak for the pneumonia.  Patient received Rocephin years.  And Covid testing is pending.  Patient stable for discharge home  Final Clinical Impression(s) / ED Diagnoses Final diagnoses:  SOB (shortness of breath)  Community acquired pneumonia of right middle lobe of lung  Elevated brain natriuretic peptide (BNP) level    Rx / DC Orders ED Discharge Orders         Ordered    potassium chloride (KLOR-CON) 10 MEQ tablet  Daily     05/24/19 0015    furosemide (LASIX) 20 MG tablet  Daily     05/24/19 0015    azithromycin (ZITHROMAX) 250 MG tablet  Daily     05/24/19 0015           Fredia Sorrow, MD 05/24/19 [phone]

## 2019-05-24 NOTE — Discharge Instructions (Addendum)
Had extensive work-up here for the shortness of breath.  Distinct findings.  Your BNP was elevated which can be on some pulmonary edema or fluid on your lungs.  However CT angio chest showing the fluid.  Also did not show any blood clots in the lungs.  Questionable pneumonia right middle lobe.  This may be persistent from the previous illness.  Repeat Covid testing is pending.  Only notified if is positive.  In the meantime take Zithromax for the right middle lobe pneumonia.  Which is a new location from the pneumonia that was present back in February.  Potassium slightly low so take the potassium supplement as directed.  Trial of low-dose Lasix take as directed.  Make an appointment to follow-up with your primary care doctor as well as her cardiologist.  Return for any new or worse symptoms.

## 2019-06-11 ENCOUNTER — Encounter: Payer: Self-pay | Admitting: Family Medicine

## 2019-07-04 ENCOUNTER — Ambulatory Visit (INDEPENDENT_AMBULATORY_CARE_PROVIDER_SITE_OTHER): Payer: 59 | Admitting: Internal Medicine

## 2019-07-04 ENCOUNTER — Encounter: Payer: Self-pay | Admitting: Internal Medicine

## 2019-07-04 ENCOUNTER — Other Ambulatory Visit: Payer: Self-pay

## 2019-07-04 VITALS — BP 116/74 | HR 103 | Temp 96.8°F | Ht 71.0 in | Wt 290.0 lb

## 2019-07-04 DIAGNOSIS — G4733 Obstructive sleep apnea (adult) (pediatric): Secondary | ICD-10-CM

## 2019-07-04 DIAGNOSIS — R0600 Dyspnea, unspecified: Secondary | ICD-10-CM

## 2019-07-04 DIAGNOSIS — I10 Essential (primary) hypertension: Secondary | ICD-10-CM | POA: Diagnosis not present

## 2019-07-04 DIAGNOSIS — R9431 Abnormal electrocardiogram [ECG] [EKG]: Secondary | ICD-10-CM

## 2019-07-04 DIAGNOSIS — R0602 Shortness of breath: Secondary | ICD-10-CM

## 2019-07-04 DIAGNOSIS — R0609 Other forms of dyspnea: Secondary | ICD-10-CM

## 2019-07-04 DIAGNOSIS — R072 Precordial pain: Secondary | ICD-10-CM | POA: Diagnosis not present

## 2019-07-04 DIAGNOSIS — E785 Hyperlipidemia, unspecified: Secondary | ICD-10-CM

## 2019-07-04 NOTE — Patient Instructions (Signed)
Medication Instructions:  CONTINUE WITH CURRENT MEDICATIONS. NO CHANGES.  *If you need a refill on your cardiac medications before your next appointment, please call your pharmacy*    Testing/Procedures: Your physician has requested that you have a lexiscan myoview. For further information please visit [email or web address]. Please follow instruction sheet, as given.  Done at [place]: At [facility], you and your health needs are our priority.  As part of our continuing mission to provide you with exceptional heart care, we have created designated Provider Care Teams.  These Care Teams include your primary Cardiologist (physician) and Advanced Practice Providers (APPs -  Physician Assistants and Nurse Practitioners) who all work together to provide you with the care you need, when you need it.  We recommend signing up for the patient portal called "MyChart".  Sign up information is provided on this After Visit Summary.  MyChart is used to connect with patients for Virtual Visits (Telemedicine).  Patients are able to view lab/test results, encounter notes, upcoming appointments, etc.  Non-urgent messages can be sent to your provider as well.   To learn more about what you can do with MyChart, go to [email or web address].    Your next appointment:   3-4 week(s)  The format for your next appointment:   Either In Person or Virtual  Provider:   Cherlynn Kaiser, MD

## 2019-07-04 NOTE — Progress Notes (Signed)
Cardiology Office Note:    Date:  07/04/2019   ID:  Alexandria Ruiz, DOB 11/15/1955, MRN [id number]  PCP:  Maury Dus, MD  Cardiologist:  No primary care provider on file.  Electrophysiologist:  None   Referring MD: Maury Dus, MD   Chief Complaint: Shortness of breath, dyspnea on exertion  History of Present Illness:    Alexandria Ruiz is a 64 y.o. female with a history of HTN, HLD, DM2, OSA on CPAP.  She presents today for evaluation of shortness of breath and dyspnea on exertion.  She tells me that 5 years ago she was evaluated for shortness of breath, and testing was unremarkable and no follow-up was recommended.  About 6 months ago her shortness of breath started again, she feels it escalated in January or February 2021.  She was prescribed Lasix from the emergency department which she does not feel helped too much.  Her primary care physician increase the dose to 40 mg daily recently.  When she walks from her desk to her car she has shortness of breath, struggles to breathe, and feels she is huffing and puffing by the time she gets in the car.  She has been treated for pneumonia twice.  She also endorses chest pain.  She does feel that sucralfate helps with her chest pain.  She denies PND, orthopnea, leg swelling, palpitations, syncope.   Past Medical History:  Diagnosis Date  . Abdominal pain   . Allergic rhinitis   . Anemia   . Anxiety   . Arthritis   . Asthma   . Bronchitis   . Diabetes mellitus    type 2  . GERD (gastroesophageal reflux disease)   . Hyperlipidemia   . Hypertension   . Obesity   . Peripheral neuropathy    denies has carpal tunnel  . PONV (postoperative nausea and vomiting)   . Shortness of breath dyspnea    since starting Trulicity diabetic med  . Sleep apnea    uses c-pap machine    Past Surgical History:  Procedure Laterality Date  . ABDOMINAL HYSTERECTOMY    . BREAST REDUCTION SURGERY    . HAMMER TOE SURGERY     bilateral  feet  . JOINT REPLACEMENT     R TKA Dr. Wynelle Link 07-25-17  . NOSE SURGERY     sinus surgery  . ORIF ANKLE FRACTURE  06/29/2011   Procedure: OPEN REDUCTION INTERNAL FIXATION (ORIF) ANKLE FRACTURE;  Surgeon: Sharmon Revere, MD;  Location: [facility];  Service: Orthopedics;  Laterality: Right;  . right rotator cuff surgery - 11-2013    . STERIOD INJECTION Right 06/23/2015   Procedure: RIGHT KNEE CORTISONE INJECTION;  Surgeon: Gaynelle Arabian, MD;  Location: [facility];  Service: Orthopedics;  Laterality: Right;  . TOTAL KNEE ARTHROPLASTY Left 06/23/2015   Procedure: TOTAL LEFT KNEE ARTHROPLASTY;  Surgeon: Gaynelle Arabian, MD;  Location: [facility];  Service: Orthopedics;  Laterality: Left;  . TOTAL KNEE ARTHROPLASTY Right 07/25/2017   Procedure: RIGHT TOTAL KNEE ARTHROPLASTY;  Surgeon: Gaynelle Arabian, MD;  Location: [facility];  Service: Orthopedics;  Laterality: Right;  Adductor Block    Current Medications: Current Meds  Medication Sig  . albuterol (PROVENTIL HFA;VENTOLIN HFA) 108 (90 BASE) MCG/ACT inhaler Inhale 2 puffs into the lungs every 6 (six) hours as needed for wheezing.  Marland Kitchen albuterol (PROVENTIL) (2.5 MG/3ML) 0.083% nebulizer solution Take 2.5 mg by nebulization every 4 (four) hours as needed for wheezing or shortness of breath.   Marland Kitchen  ALPRAZolam (XANAX) 0.5 MG tablet Take 0.25 mg by mouth daily as needed for anxiety.   Marland Kitchen amLODipine-olmesartan (AZOR) 5-40 MG per tablet Take 1 tablet by mouth daily.  Marland Kitchen atorvastatin (LIPITOR) 40 MG tablet Take 40 mg by mouth 3 (three) times a week. Take on MWF Only  . budesonide-formoterol (SYMBICORT) 160-4.5 MCG/ACT inhaler Inhale 2 puffs into the lungs 2 (two) times daily.  . DULoxetine (CYMBALTA) 60 MG capsule Take 60 mg by mouth 2 (two) times daily.  Marland Kitchen FERREX 150 150 MG capsule Take 150 mg by mouth 2 (two) times daily.  . furosemide (LASIX) 20 MG tablet Take 1 tablet (20 mg total) by mouth daily.  . potassium chloride (KLOR-CON) 10 MEQ tablet Take 1 tablet (10 mEq total) by  mouth daily.  . RABEprazole (ACIPHEX) 20 MG tablet Take 20 mg by mouth 2 (two) times daily.   Marland Kitchen SPIRIVA RESPIMAT 1.25 MCG/ACT AERS Take 1.25 mcg by mouth daily.   . sucralfate (CARAFATE) 1 g tablet Take 1 g by mouth 4 (four) times daily.  . TRULICITY 1.5 0000000 SOPN Inject 0.5 mLs into the skin once a week. On Saturdays  . valACYclovir (VALTREX) 1000 MG tablet Take 1,000 mg by mouth daily.   . [DISCONTINUED] aspirin EC 325 MG EC tablet Take 1 tablet (325 mg total) by mouth 2 (two) times daily. (Patient taking differently: Take 325 mg by mouth daily. )  . [DISCONTINUED] azithromycin (ZITHROMAX) 250 MG tablet Take 1 tablet (250 mg total) by mouth daily. Take first 2 tablets together, then 1 every day until finished.  . [DISCONTINUED] cetirizine (ZYRTEC) 10 MG tablet Take 10 mg by mouth every morning.  . [DISCONTINUED] gabapentin (NEURONTIN) 300 MG capsule Take 1 capsule (300 mg total) by mouth 3 (three) times daily. Gabapentin 300 mg Protocol Take a 300 mg capsule three times a day for two weeks, Then a 300 mg capsule twice a day for two weeks, Then a 300 mg capsule once a day for two weeks, then discontinue the Gabapentin.  . [DISCONTINUED] levofloxacin (LEVAQUIN) 750 MG tablet Take 1 tablet (750 mg total) by mouth daily.  . [DISCONTINUED] methocarbamol (ROBAXIN) 500 MG tablet Take 1 tablet (500 mg total) by mouth every 6 (six) hours as needed for muscle spasms.  . [DISCONTINUED] oxyCODONE (OXY IR/ROXICODONE) 5 MG immediate release tablet Take 1-2 tablets (5-10 mg total) by mouth every 6 (six) hours as needed for moderate pain or severe pain.  . [DISCONTINUED] PROAIR RESPICLICK 123XX123 (90 Base) MCG/ACT AEPB Inhale 2 puffs into the lungs every 4 (four) hours as needed (sob and wheezing).   . [DISCONTINUED] traMADol (ULTRAM) 50 MG tablet Take 1-2 tablets (50-100 mg total) by mouth every 6 (six) hours as needed for moderate pain (if hydromorphone ineffective).  . [DISCONTINUED] triamcinolone cream  (KENALOG) 0.1 % Apply 1 application topically 2 (two) times daily as needed (rash).   . [DISCONTINUED] VYVANSE 70 MG capsule Take 70 mg by mouth daily as needed (diet control).      Allergies:   Hydrocodone and Oxycodone   Social History   Socioeconomic History  . Marital status: Married    Spouse name: Not on file  . Number of children: Not on file  . Years of education: Not on file  . Highest education level: Not on file  Occupational History  . Not on file  Tobacco Use  . Smoking status: Former Smoker    Packs/day: 0.50    Years: 30.00    Pack  years: 15.00    Quit date: 10/29/2006    Years since quitting: 12.7  . Smokeless tobacco: Never Used  Substance and Sexual Activity  . Alcohol use: No  . Drug use: No  . Sexual activity: Not Currently  Other Topics Concern  . Not on file  Social History Narrative  . Not on file   Social Determinants of Health   Financial Resource Strain:   . Difficulty of Paying Living Expenses:   Food Insecurity:   . Worried About Charity fundraiser in the Last Year:   . Arboriculturist in the Last Year:   Transportation Needs:   . Film/video editor (Medical):   Marland Kitchen Lack of Transportation (Non-Medical):   Physical Activity:   . Days of Exercise per Week:   . Minutes of Exercise per Session:   Stress:   . Feeling of Stress :   Social Connections:   . Frequency of Communication with Friends and Family:   . Frequency of Social Gatherings with Friends and Family:   . Attends Religious Services:   . Active Member of Clubs or Organizations:   . Attends Archivist Meetings:   Marland Kitchen Marital Status:      Family History: The patient's family history is not on file. She was adopted.  ROS:   Please see the history of present illness.    All other systems reviewed and are negative.  EKGs/Labs/Other Studies Reviewed:    The following studies were reviewed today:  EKG: Sinus tachycardia, left atrial enlargement, and possible  anterior infarct  I have independently reviewed the images from CTA chest 05/23/2019.  No central PE, bolus timing is early, no definite significant coronary artery calcifications.  There is contrast reflux into the IVC which may suggest increased right-sided pressures.  Recent Labs: 05/23/2019: ALT 44; B Natriuretic Peptide 713.2; BUN 14; Creatinine, Ser 0.92; Hemoglobin 13.2; Platelets 320; Potassium 3.4; Sodium 140  Recent Lipid Panel No results found for: CHOL, TRIG, HDL, CHOLHDL, VLDL, LDLCALC, LDLDIRECT  Physical Exam:    VS:  BP 116/74 (BP Location: Left Arm, Patient Position: Sitting, Cuff Size: Large)   Pulse (!) 103   Temp (!) 96.8 F (36 C)   Ht 5\' 11"  (1.803 m)   Wt 290 lb (131.5 kg)   BMI 40.45 kg/m     Wt Readings from Last 5 Encounters:  07/04/19 290 lb (131.5 kg)  05/23/19 298 lb (135.2 kg)  07/25/17 (!) 310 lb (140.6 kg)  07/15/17 (!) 310 lb (140.6 kg)  05/06/16 (!) 330 lb (149.7 kg)     Constitutional: No acute distress Eyes: sclera non-icteric, normal conjunctiva and lids ENMT: Mask in place Cardiovascular: regular rhythm, normal rate, no murmurs. S1 and S2 normal. Radial pulses normal bilaterally. No jugular venous distention.  Respiratory: clear to auscultation bilaterally GI : normal bowel sounds, soft and nontender. No distention.   MSK: extremities warm, well perfused. No edema.  NEURO: grossly nonfocal exam, moves all extremities. PSYCH: alert and oriented x 3, normal mood and affect.   ASSESSMENT:    1. Dyspnea on exertion   2. Precordial pain   3. Shortness of breath   4. Essential hypertension   5. Hyperlipidemia, unspecified hyperlipidemia type   6. OSA (obstructive sleep apnea)   7. Abnormal EKG    PLAN:    Dyspnea on exertion Shortness of breath Precordial pain - Plan: MYOCARDIAL PERFUSION IMAGING   She has exertional dyspnea and atypical chest pain.  She tells me a work-up was normal 5 years ago however that work-up is not available  for review.  We will be best to exclude ischemia with her risk factors of diabetes, hypertension, hyperlipidemia, we will order a stress Myoview.  She also has sleep apnea, exertional dyspnea, and referring physician questions whether she has an element of heart failure.  There is also contrast reflux into the IVC on her recent CT scan, suggesting potential elevated right-sided/pulmonary pressures.  Would be best to obtain an echocardiogram to evaluate myocardial structure and systolic function as well as pulmonary pressures and diastolic function.  Can continue Lasix 40 mg daily at this time if helpful.  Essential hypertension-continue amlodipine olmesartan  Hyperlipidemia, unspecified hyperlipidemia type-continue atorvastatin  OSA (obstructive sleep apnea)-continue CPAP   Total time of encounter: 60 minutes total time of encounter, including 35 minutes spent in face-to-face patient care on the date of this encounter. This time includes coordination of care and counseling regarding above mentioned problem list. Remainder of non-face-to-face time involved reviewing chart documents/testing relevant to the patient encounter and documentation in the medical record. I have independently reviewed documentation from referring provider.   Cherlynn Kaiser, MD [place]  [facility]    Medication Adjustments/Labs and Tests Ordered: Current medicines are reviewed at length with the patient today.  Concerns regarding medicines are outlined above.  Orders Placed This Encounter  Procedures  . MYOCARDIAL PERFUSION IMAGING  . EKG 12-Lead   No orders of the defined types were placed in this encounter.   Patient Instructions  Medication Instructions:  CONTINUE WITH CURRENT MEDICATIONS. NO CHANGES.  *If you need a refill on your cardiac medications before your next appointment, please call your pharmacy*    Testing/Procedures: Your physician has requested that you have a lexiscan myoview.  For further information please visit [email or web address]. Please follow instruction sheet, as given.  Done at [place]: At [facility], you and your health needs are our priority.  As part of our continuing mission to provide you with exceptional heart care, we have created designated Provider Care Teams.  These Care Teams include your primary Cardiologist (physician) and Advanced Practice Providers (APPs -  Physician Assistants and Nurse Practitioners) who all work together to provide you with the care you need, when you need it.  We recommend signing up for the patient portal called "MyChart".  Sign up information is provided on this After Visit Summary.  MyChart is used to connect with patients for Virtual Visits (Telemedicine).  Patients are able to view lab/test results, encounter notes, upcoming appointments, etc.  Non-urgent messages can be sent to your provider as well.   To learn more about what you can do with MyChart, go to [email or web address].    Your next appointment:   3-4 week(s)  The format for your next appointment:   Either In Person or Virtual  Provider:   Cherlynn Kaiser, MD

## 2019-07-19 ENCOUNTER — Telehealth (HOSPITAL_COMMUNITY): Payer: Self-pay

## 2019-07-19 NOTE — Telephone Encounter (Signed)
etailed instructions left on the patient;s voicemail. Asked to call back with any questions. S.Jia Mohamed EMTP

## 2019-07-24 ENCOUNTER — Ambulatory Visit: Payer: 59 | Admitting: Interventional Cardiology

## 2019-07-24 ENCOUNTER — Ambulatory Visit (HOSPITAL_COMMUNITY): Payer: 59

## 2019-07-25 ENCOUNTER — Ambulatory Visit (HOSPITAL_COMMUNITY): Payer: 59

## 2019-07-31 ENCOUNTER — Telehealth: Payer: Self-pay | Admitting: *Deleted

## 2019-07-31 DIAGNOSIS — R0600 Dyspnea, unspecified: Secondary | ICD-10-CM

## 2019-07-31 DIAGNOSIS — R0602 Shortness of breath: Secondary | ICD-10-CM

## 2019-07-31 DIAGNOSIS — R0609 Other forms of dyspnea: Secondary | ICD-10-CM

## 2019-07-31 DIAGNOSIS — R072 Precordial pain: Secondary | ICD-10-CM

## 2019-07-31 NOTE — Telephone Encounter (Signed)
.   Left message for patient to call back. Would like to reschedule 5/21 f/u appointment til after test are completed.   nuclear test will be 6/8,6/9   placed an order for echo th be done as well - will try for one the above days.    request for patient call back to discus changes with appointments

## 2019-07-31 NOTE — Telephone Encounter (Signed)
-----   Message from Elouise Munroe, MD sent at 07/21/2019 10:47 AM EDT ----- Regarding: please order echo Please order an echo on this patient for SOB and dyspnea on exertion and chest pain.  GA

## 2019-08-02 NOTE — Telephone Encounter (Signed)
Patient received message and called test schedule and f/u appointment reschedul to 08/24/19

## 2019-08-03 ENCOUNTER — Telehealth: Payer: 59 | Admitting: Internal Medicine

## 2019-08-21 ENCOUNTER — Ambulatory Visit (HOSPITAL_COMMUNITY): Payer: 59 | Attending: Cardiology

## 2019-08-21 ENCOUNTER — Other Ambulatory Visit: Payer: Self-pay

## 2019-08-21 ENCOUNTER — Ambulatory Visit (HOSPITAL_BASED_OUTPATIENT_CLINIC_OR_DEPARTMENT_OTHER): Payer: 59

## 2019-08-21 DIAGNOSIS — R0602 Shortness of breath: Secondary | ICD-10-CM | POA: Insufficient documentation

## 2019-08-21 DIAGNOSIS — R072 Precordial pain: Secondary | ICD-10-CM | POA: Diagnosis not present

## 2019-08-21 DIAGNOSIS — R0600 Dyspnea, unspecified: Secondary | ICD-10-CM

## 2019-08-21 DIAGNOSIS — R9431 Abnormal electrocardiogram [ECG] [EKG]: Secondary | ICD-10-CM | POA: Insufficient documentation

## 2019-08-21 DIAGNOSIS — R0609 Other forms of dyspnea: Secondary | ICD-10-CM

## 2019-08-21 LAB — ECHOCARDIOGRAM COMPLETE
Height: 71 in
Weight: 4640 oz

## 2019-08-21 MED ORDER — TECHNETIUM TC 99M TETROFOSMIN IV KIT
31.6000 | PACK | Freq: Once | INTRAVENOUS | Status: AC | PRN
  Administered 2019-08-21: 31.6 via INTRAVENOUS
  Filled 2019-08-21: qty 32

## 2019-08-21 MED ORDER — REGADENOSON 0.4 MG/5ML IV SOLN
0.4000 mg | Freq: Once | INTRAVENOUS | Status: AC
  Administered 2019-08-21: 0.4 mg via INTRAVENOUS

## 2019-08-21 MED ORDER — PERFLUTREN LIPID MICROSPHERE
1.0000 mL | INTRAVENOUS | Status: AC | PRN
  Administered 2019-08-21: 2 mL via INTRAVENOUS

## 2019-08-22 ENCOUNTER — Ambulatory Visit (HOSPITAL_COMMUNITY): Payer: 59 | Attending: Cardiovascular Disease

## 2019-08-22 LAB — MYOCARDIAL PERFUSION IMAGING
LV dias vol: 200 mL (ref 46–106)
LV sys vol: 140 mL
Peak HR: 96 {beats}/min
Rest HR: 90 {beats}/min
SDS: 2
SRS: 0
SSS: 2
TID: 1.02

## 2019-08-22 MED ORDER — TECHNETIUM TC 99M TETROFOSMIN IV KIT
32.3000 | PACK | Freq: Once | INTRAVENOUS | Status: AC | PRN
  Administered 2019-08-22: 32.3 via INTRAVENOUS
  Filled 2019-08-22: qty 33

## 2019-08-24 ENCOUNTER — Other Ambulatory Visit: Payer: Self-pay

## 2019-08-24 ENCOUNTER — Encounter: Payer: Self-pay | Admitting: Internal Medicine

## 2019-08-24 ENCOUNTER — Ambulatory Visit (INDEPENDENT_AMBULATORY_CARE_PROVIDER_SITE_OTHER): Payer: 59 | Admitting: Internal Medicine

## 2019-08-24 VITALS — BP 116/74 | HR 100 | Ht 71.0 in | Wt 291.6 lb

## 2019-08-24 DIAGNOSIS — R0609 Other forms of dyspnea: Secondary | ICD-10-CM

## 2019-08-24 DIAGNOSIS — I517 Cardiomegaly: Secondary | ICD-10-CM

## 2019-08-24 DIAGNOSIS — R0600 Dyspnea, unspecified: Secondary | ICD-10-CM | POA: Diagnosis not present

## 2019-08-24 DIAGNOSIS — I10 Essential (primary) hypertension: Secondary | ICD-10-CM | POA: Diagnosis not present

## 2019-08-24 DIAGNOSIS — E785 Hyperlipidemia, unspecified: Secondary | ICD-10-CM | POA: Diagnosis not present

## 2019-08-24 DIAGNOSIS — G4733 Obstructive sleep apnea (adult) (pediatric): Secondary | ICD-10-CM

## 2019-08-24 MED ORDER — METOPROLOL SUCCINATE ER 25 MG PO TB24: 25.0000 mg | ORAL_TABLET | Freq: Every day | ORAL | 4 refills | Status: DC

## 2019-08-24 NOTE — Progress Notes (Signed)
Cardiology Office Note:    Date:  08/24/2019   ID:  CHERREE CONERLY, DOB [birth year], MRN [id number]  PCP:  Maury Dus, MD  Cardiologist:  No primary care provider on file.  Electrophysiologist:  None   Referring MD: Maury Dus, MD   Chief Complaint: Follow-up stress test  History of Present Illness:    MADISIN HASAN is a 64 y.o. female with a history of HTN, HLD, DM2, OSA on CPAP.  She presents today for evaluation of shortness of breath and dyspnea on exertion.  She tells me that 5 years ago she was evaluated for shortness of breath, and testing was unremarkable and no follow-up was recommended.  About 8 months ago her shortness of breath started again, she feels it escalated in January or February 2021.  She was prescribed Lasix from the emergency department which she does not feel helped too much.  Her primary care physician increase the dose to 40 mg daily.  She feels that this was of modest benefit.  We performed stress testing which demonstrated a reduced ejection fraction without ischemia or infarction on perfusion imaging.  Echocardiogram also suggested reduced ejection fraction with global hypokinesis.  She denies chest pain but continues to describe shortness of breath with brief periods of exertion including walking from her desk to her car.  We discussed today evaluation and management of heart failure with reduced ejection fraction including medical therapy, defining coronary anatomy, and follow-up imaging.  All questions answered to the best my ability.  Past Medical History:  Diagnosis Date   Abdominal pain    Allergic rhinitis    Anemia    Anxiety    Arthritis    Asthma    Bronchitis    Diabetes mellitus    type 2   GERD (gastroesophageal reflux disease)    Hyperlipidemia    Hypertension    Obesity    Peripheral neuropathy    denies has carpal tunnel   PONV (postoperative nausea and vomiting)    Shortness of breath dyspnea    since  starting [employer] diabetic med   Sleep apnea    uses c-pap machine    Past Surgical History:  Procedure Laterality Date   ABDOMINAL HYSTERECTOMY     BREAST REDUCTION SURGERY     HAMMER TOE SURGERY     bilateral feet   JOINT REPLACEMENT     R TKA Dr. Wynelle Link 07-25-17   NOSE SURGERY     sinus surgery   ORIF ANKLE FRACTURE  06/29/2011   Procedure: OPEN REDUCTION INTERNAL FIXATION (ORIF) ANKLE FRACTURE;  Surgeon: Sharmon Revere, MD;  Location: [facility];  Service: Orthopedics;  Laterality: Right;   right rotator cuff surgery - 11-2013     STERIOD INJECTION Right 06/23/2015   Procedure: RIGHT KNEE CORTISONE INJECTION;  Surgeon: Gaynelle Arabian, MD;  Location: [facility];  Service: Orthopedics;  Laterality: Right;   TOTAL KNEE ARTHROPLASTY Left 06/23/2015   Procedure: TOTAL LEFT KNEE ARTHROPLASTY;  Surgeon: Gaynelle Arabian, MD;  Location: [facility];  Service: Orthopedics;  Laterality: Left;   TOTAL KNEE ARTHROPLASTY Right 07/25/2017   Procedure: RIGHT TOTAL KNEE ARTHROPLASTY;  Surgeon: Gaynelle Arabian, MD;  Location: [facility];  Service: Orthopedics;  Laterality: Right;  Adductor Block    Current Medications: Current Meds  Medication Sig   albuterol (PROVENTIL HFA;VENTOLIN HFA) 108 (90 BASE) MCG/ACT inhaler Inhale 2 puffs into the lungs every 6 (six) hours as needed for wheezing.   albuterol (PROVENTIL) (2.5 MG/3ML) 0.083%  nebulizer solution Take 2.5 mg by nebulization every 4 (four) hours as needed for wheezing or shortness of breath.    ALPRAZolam (XANAX) 0.5 MG tablet Take 0.25 mg by mouth daily as needed for anxiety.    amLODipine-olmesartan (AZOR) 5-40 MG per tablet Take 1 tablet by mouth daily.   atorvastatin (LIPITOR) 40 MG tablet Take 40 mg by mouth 3 (three) times a week. Take on MWF Only   budesonide-formoterol (SYMBICORT) 160-4.5 MCG/ACT inhaler Inhale 2 puffs into the lungs 2 (two) times daily.   DULoxetine (CYMBALTA) 60 MG capsule Take 60 mg by mouth 2 (two) times daily.    FERREX 150 150 MG capsule Take 150 mg by mouth 2 (two) times daily.   furosemide (LASIX) 20 MG tablet Take 1 tablet (20 mg total) by mouth daily.   potassium chloride (KLOR-CON) 10 MEQ tablet Take 1 tablet (10 mEq total) by mouth daily.   RABEprazole (ACIPHEX) 20 MG tablet Take 20 mg by mouth 2 (two) times daily.    SPIRIVA RESPIMAT 1.25 MCG/ACT AERS Take 1.25 mcg by mouth daily.    sucralfate (CARAFATE) 1 g tablet Take 1 g by mouth 4 (four) times daily.   TRULICITY 1.5 JJ/8.8CZ SOPN Inject 0.5 mLs into the skin once a week. On Saturdays   valACYclovir (VALTREX) 1000 MG tablet Take 1,000 mg by mouth daily.      Allergies:   Hydrocodone and Oxycodone   Social History   Socioeconomic History   Marital status: Married    Spouse name: Not on file   Number of children: Not on file   Years of education: Not on file   Highest education level: Not on file  Occupational History   Not on file  Tobacco Use   Smoking status: Former Smoker    Packs/day: 0.50    Years: 30.00    Pack years: 15.00    Quit date: 10/29/2006    Years since quitting: 12.8   Smokeless tobacco: Never Used  Vaping Use   Vaping Use: Never used  Substance and Sexual Activity   Alcohol use: No   Drug use: No   Sexual activity: Not Currently  Other Topics Concern   Not on file  Social History Narrative   Not on file   Social Determinants of Health   Financial Resource Strain:    Difficulty of Paying Living Expenses:   Food Insecurity:    Worried About Charity fundraiser in the Last Year:    Arboriculturist in the Last Year:   Transportation Needs:    Film/video editor (Medical):    Lack of Transportation (Non-Medical):   Physical Activity:    Days of Exercise per Week:    Minutes of Exercise per Session:   Stress:    Feeling of Stress :   Social Connections:    Frequency of Communication with Friends and Family:    Frequency of Social Gatherings with Friends and  Family:    Attends Religious Services:    Active Member of Clubs or Organizations:    Attends Archivist Meetings:    Marital Status:      Family History: The patient's family history is not on file. She was adopted.  ROS:   Please see the history of present illness.    All other systems reviewed and are negative.  EKGs/Labs/Other Studies Reviewed:    The following studies were reviewed today:  EKG:  n/a  I have independently reviewed the  images from CTA chest 05/23/2019, no definite coronary artery calcifications.  Contrast reflux into the IVC suggests increased right-sided pressures.  Recent Labs: 05/23/2019: ALT 44; B Natriuretic Peptide 713.2; BUN 14; Creatinine, Ser 0.92; Hemoglobin 13.2; Platelets 320; Potassium 3.4; Sodium 140  Recent Lipid Panel No results found for: CHOL, TRIG, HDL, CHOLHDL, VLDL, LDLCALC, LDLDIRECT  Physical Exam:    VS:  BP 116/74    Pulse 100    Ht '5\' 11"'$  (1.803 m)    Wt 291 lb 9.6 oz (132.3 kg)    SpO2 95%    BMI 40.67 kg/m     Wt Readings from Last 5 Encounters:  08/24/19 291 lb 9.6 oz (132.3 kg)  08/21/19 290 lb (131.5 kg)  07/04/19 290 lb (131.5 kg)  05/23/19 298 lb (135.2 kg)  07/25/17 (!) 310 lb (140.6 kg)     Constitutional: No acute distress Eyes: sclera non-icteric, normal conjunctiva and lids ENMT: normal dentition, moist mucous membranes Cardiovascular: regular rhythm, normal rate, no murmurs. S1 and S2 normal. Radial pulses normal bilaterally. No jugular venous distention.  Respiratory: clear to auscultation bilaterally GI : normal bowel sounds, soft and nontender. No distention.   MSK: extremities warm, well perfused.  Mild bilateral pedal edema.  NEURO: grossly nonfocal exam, moves all extremities. PSYCH: alert and oriented x 3, normal mood and affect.   ASSESSMENT:    1. Dyspnea on exertion   2. LVH (left ventricular hypertrophy)    PLAN:    Dyspnea on exertion - Plan: CT CORONARY MORPH W/CTA COR W/SCORE  W/CA W/CM &/OR WO/CM, CT CORONARY FRACTIONAL FLOW RESERVE DATA PREP, CT CORONARY FRACTIONAL FLOW RESERVE FLUID ANALYSIS, Basic metabolic panel  LVH (left ventricular hypertrophy) - Plan: CT CORONARY MORPH W/CTA COR W/SCORE W/CA W/CM &/OR WO/CM, CT CORONARY FRACTIONAL FLOW RESERVE DATA PREP, CT CORONARY FRACTIONAL FLOW RESERVE FLUID ANALYSIS, Basic metabolic panel   We will initiate metoprolol succinate 25 mg daily.  We will follow-up in 2 weeks and initiate Entresto at that point if blood pressure tolerates.  She and I have discussed in great detail the evaluation of coronary artery anatomy.  I have offered her conventional coronary angiography and described the risks and details of the procedure thoroughly.  Patient would not like to undergo coronary angiography at this time, we have discussed that perhaps coronary CTA would be a reasonable alternative particularly in the absence of ischemia or infarction on nuclear study.  I would like for her to undergo a right heart catheterization, she is not eager to proceed with this urgently and would like to see the results of coronary CT first.  I would be interested in pulmonary pressures given shortness of breath and evidence of contrast reflux into the IVC on CTA chest suggesting elevated right-sided pressures.  Can continue Lasix at current dose.  For hypertension continue amlodipine olmesartan, will be adding metoprolol today.  Hyperlipidemia-continue atorvastatin.  OSA-continue CPAP, she denies any issues with therapy.   Total time of encounter: 40 minutes total time of encounter, including 30 minutes spent in face-to-face patient care on the date of this encounter. This time includes coordination of care and counseling regarding above mentioned problem list. Remainder of non-face-to-face time involved reviewing chart documents/testing relevant to the patient encounter and documentation in the medical record. I have independently reviewed  documentation from referring provider.   Cherlynn Kaiser, MD [place]   [facility]    Medication Adjustments/Labs and Tests Ordered: Current medicines are reviewed at length with the  patient today.  Concerns regarding medicines are outlined above.  Orders Placed This Encounter  Procedures   CT CORONARY MORPH W/CTA COR W/SCORE W/CA W/CM &/OR WO/CM   CT CORONARY FRACTIONAL FLOW RESERVE DATA PREP   CT CORONARY FRACTIONAL FLOW RESERVE FLUID ANALYSIS   Basic metabolic panel   Meds ordered this encounter  Medications   metoprolol succinate (TOPROL XL) 25 MG 24 hr tablet    Sig: Take 1 tablet (25 mg total) by mouth daily.    Dispense:  30 tablet    Refill:  4    Patient Instructions  Medication Instructions:  Metoprolol Succinate 25 mg 1 Tablet Daily *If you need a refill on your cardiac medications before your next appointment, please call your pharmacy*   Lab Work: [facility] to be done June 22nd or June 23rd. If you have labs (blood work) drawn today and your tests are completely normal, you will receive your results only by:  [place] (if you have MyChart) OR  A paper copy in the mail If you have any lab test that is abnormal or we need to change your treatment, we will call you to review the results.   Testing/Procedures: [facility]. Will be scheduled once insurance Authorized. For coronary CTA.   Follow-Up: At [facility], you and your health needs are our priority.  As part of our continuing mission to provide you with exceptional heart care, we have created designated Provider Care Teams.  These Care Teams include your primary Cardiologist (physician) and Advanced Practice Providers (APPs -  Physician Assistants and Nurse Practitioners) who all work together to provide you with the care you need, when you need it.  We recommend signing up for the patient portal called "MyChart".  Sign up information is provided on this After Visit  Summary.  MyChart is used to connect with patients for Virtual Visits (Telemedicine).  Patients are able to view lab/test results, encounter notes, upcoming appointments, etc.  Non-urgent messages can be sent to your provider as well.   To learn more about what you can do with MyChart, go to [email or web address].    Your next appointment:   2 week(s)  The format for your next appointment:   In Person  Provider:   Cherlynn Kaiser, MD      Your cardiac CT will be scheduled at  the below locations:   [facility] [address] [place], [place] [address] [phone]    [facility], please arrive at the [facility] main entrance of [facility] 30 minutes prior to test start time. Proceed to the [facility] Radiology Department (first floor) to check-in and test prep.   Please follow these instructions carefully (unless otherwise directed):    On the Night Before the Test:  Be sure to Drink plenty of water.  Do not consume any caffeinated/decaffeinated beverages or chocolate 12 hours prior to your test.  Do not take any antihistamines 12 hours prior to your test   On the Day of the Test:  Drink plenty of water. Do not drink any water within one hour of the test.  Do not eat any food 4 hours prior to the test.  You may take your regular medications prior to the test.   Take metoprolol (Lopressor) 100 mg two hours prior to test.  HOLD Furosemide/Hydrochlorothiazide morning of the test.  FEMALES- please wear underwire-free bra if available        After the Test:  Drink plenty of water.  After receiving IV contrast, you may experience a mild flushed feeling. This is normal.  On occasion, you may experience a mild rash up to 24 hours after the test. This is not dangerous. If this occurs, you can take Benadryl 25 mg and increase your fluid intake.  If you experience trouble breathing, this can be serious. If it is severe call 911  IMMEDIATELY. If it is mild, please call our office.  If you take any of these medications: Glipizide/Metformin, Avandament, Glucavance, please do not take 48 hours after completing test unless otherwise instructed.   Once we have confirmed authorization from your insurance company, we will call you to set up a date and time for your test.   For non-scheduling related questions, please contact the cardiac imaging nurse navigator should you have any questions/concerns: Marchia Bond, Cardiac Imaging Nurse Navigator Burley Saver, Interim Cardiac Imaging Nurse [place] and Vascular Services Direct Office Dial: [phone]   For scheduling needs, including cancellations and rescheduling, please call [phone].

## 2019-08-24 NOTE — Patient Instructions (Addendum)
Medication Instructions:  Metoprolol Succinate 25 mg 1 Tablet Daily *If you need a refill on your cardiac medications before your next appointment, please call your pharmacy*   Lab Work: [facility] to be done June 22nd or June 23rd. If you have labs (blood work) drawn today and your tests are completely normal, you will receive your results only by: [place] MyChart Message (if you have MyChart) OR . A paper copy in the mail If you have any lab test that is abnormal or we need to change your treatment, we will call you to review the results.   Testing/Procedures: [facility]. Will be scheduled once insurance Authorized. For coronary CTA.   Follow-Up: At [facility], you and your health needs are our priority.  As part of our continuing mission to provide you with exceptional heart care, we have created designated Provider Care Teams.  These Care Teams include your primary Cardiologist (physician) and Advanced Practice Providers (APPs -  Physician Assistants and Nurse Practitioners) who all work together to provide you with the care you need, when you need it.  We recommend signing up for the patient portal called "MyChart".  Sign up information is provided on this After Visit Summary.  MyChart is used to connect with patients for Virtual Visits (Telemedicine).  Patients are able to view lab/test results, encounter notes, upcoming appointments, etc.  Non-urgent messages can be sent to your provider as well.   To learn more about what you can do with MyChart, go to [email or web address].    Your next appointment:   2 week(s)  The format for your next appointment:   In Person  Provider:   Cherlynn Kaiser, MD      Your cardiac CT will be scheduled at  the below locations:   [facility] [address] [place], [place] [address] [phone]    [facility], please arrive at the [facility] main entrance of [facility] 30 minutes prior to  test start time. Proceed to the [facility] Radiology Department (first floor) to check-in and test prep.   Please follow these instructions carefully (unless otherwise directed):    On the Night Before the Test: . Be sure to Drink plenty of water. . Do not consume any caffeinated/decaffeinated beverages or chocolate 12 hours prior to your test. . Do not take any antihistamines 12 hours prior to your test   On the Day of the Test: . Drink plenty of water. Do not drink any water within one hour of the test. . Do not eat any food 4 hours prior to the test. . You may take your regular medications prior to the test.  . Take metoprolol (Lopressor) 100 mg two hours prior to test. . HOLD Furosemide/Hydrochlorothiazide morning of the test. . FEMALES- please wear underwire-free bra if available        After the Test: . Drink plenty of water. . After receiving IV contrast, you may experience a mild flushed feeling. This is normal. . On occasion, you may experience a mild rash up to 24 hours after the test. This is not dangerous. If this occurs, you can take Benadryl 25 mg and increase your fluid intake. . If you experience trouble breathing, this can be serious. If it is severe call 911 IMMEDIATELY. If it is mild, please call our office. . If you take any of these medications: Glipizide/Metformin, Avandament, Glucavance, please do not take 48 hours after completing test unless otherwise instructed.  Once we have confirmed authorization from your insurance company, we will call you to set up a date and time for your test.   For non-scheduling related questions, please contact the cardiac imaging nurse navigator should you have any questions/concerns: Marchia Bond, Cardiac Imaging Nurse Navigator Burley Saver, Interim Cardiac Imaging Nurse [place] and Vascular Services Direct Office Dial: [phone]   For scheduling needs, including cancellations and rescheduling, please  call [phone].

## 2019-09-14 ENCOUNTER — Telehealth: Payer: Self-pay | Admitting: Internal Medicine

## 2019-09-14 NOTE — Telephone Encounter (Signed)
The patient called in with complaints of continued shortness of breath. She stated that she recently saw Dr. Margaretann Loveless for this reason. She stated that she has not had any weight gain nor noticeable edema.   She did state that her Furosemide was increased to 60 mg by her PCP but has not seen any increase in urination.   Albuterol has not been helping.  She stated that when she takes a deep breath she can feel a tightness in her chest.   She has refused to go to an urgent care or [facility] stating that she wants Dr. Margaretann Loveless to order something to fix it. She has been advised that with her shortness of breath, which is very audible on the phone, that she should be seen today. She stated that this has been an ongoing problem for a while now and she refuses to go to any of the urgent cares.   Spoke with Dr. Margaretann Loveless and the patient will be called from a remote health monitoring team from the heart failure at home program.  The patient has been made aware and verbalized her understanding.

## 2019-09-14 NOTE — Telephone Encounter (Signed)
Per Dr. Margaretann Loveless, the patient will be contacted tonight from the heart failure at home program. The patient has verbalized their understanding.

## 2019-09-14 NOTE — Telephone Encounter (Signed)
Pt c/o Shortness Of Breath: STAT if SOB developed within the last 24 hours or pt is noticeably SOB on the phone  1. Are you currently SOB (can you hear that pt is SOB on the phone)? yes  2. How long have you been experiencing SOB? months  3. Are you SOB when sitting or when up moving around? When moving around  4. Are you currently experiencing any other symptoms? Patient states she can't take 2 or 3 steps without being out of breath. She is very emotional and states she does not know how much longer she can stay like this. She states she would like something prescribed to help.

## 2019-09-14 NOTE — Telephone Encounter (Signed)
I contacted Ms. Brigandi around 2:45 PM today regarding worsening shortness of breath for 2 weeks.  She has had protracted shortness of breath which has been slowly worsening over at least the last 7 months.  She has been noted to have a reduced ejection fraction on echocardiogram and nuclear stress test.  She is awaiting coronary CTA to define coronary anatomy, and eventually right heart catheterization to evaluate for pulmonary hypertension.  Her primary care physician increased her Lasix to 60 mg daily but she has not noticed a difference in her symptoms or urine output.  She denies lower extremity swelling.  She is audibly short of breath on the phone and notes that she is afraid to walk because of shortness of breath.  I have recommended to the patient that she come to the emergency department for evaluation by cardiology.  She really does not want to come to the hospital.  I have offered her an acute care visit in the office if there is availability, she would like to pursue this plan.  She is uncertain why she needs an office visit, however I described that physical exam may inform further treatment.  She is agreeable.

## 2019-09-19 ENCOUNTER — Ambulatory Visit: Payer: 59 | Admitting: Internal Medicine

## 2019-09-19 ENCOUNTER — Encounter: Payer: Self-pay | Admitting: Internal Medicine

## 2019-09-19 ENCOUNTER — Other Ambulatory Visit: Payer: Self-pay

## 2019-09-19 VITALS — BP 124/90 | HR 90 | Ht 71.0 in | Wt 305.0 lb

## 2019-09-19 DIAGNOSIS — I5023 Acute on chronic systolic (congestive) heart failure: Secondary | ICD-10-CM

## 2019-09-19 DIAGNOSIS — G4733 Obstructive sleep apnea (adult) (pediatric): Secondary | ICD-10-CM

## 2019-09-19 DIAGNOSIS — I517 Cardiomegaly: Secondary | ICD-10-CM

## 2019-09-19 DIAGNOSIS — E785 Hyperlipidemia, unspecified: Secondary | ICD-10-CM

## 2019-09-19 DIAGNOSIS — R0609 Other forms of dyspnea: Secondary | ICD-10-CM

## 2019-09-19 DIAGNOSIS — I10 Essential (primary) hypertension: Secondary | ICD-10-CM | POA: Diagnosis not present

## 2019-09-19 DIAGNOSIS — R0600 Dyspnea, unspecified: Secondary | ICD-10-CM | POA: Diagnosis not present

## 2019-09-19 NOTE — Progress Notes (Signed)
Cardiology Office Note:    Date:  09/19/2019   ID:  Alexandria Ruiz, DOB [birth year], MRN [id number]  PCP:  Maury Dus, MD  Cardiologist:  Elouise Munroe, MD  Electrophysiologist:  None   Referring MD: Maury Dus, MD   Chief Complaint: DOE  History of Present Illness:    Alexandria Ruiz is a 64 y.o. female with a history of HTN, HLD, DM2, OSA on CPAP.  She presents today for evaluation of shortness of breath and dyspnea on exertion.  She tells me that 5 years ago she was evaluated for shortness of breath, and testing was unremarkable and no follow-up was recommended.  About 8 months ago her shortness of breath started again, she feels it escalated in January or February 2021.   We performed a nuclear perfusion stress test and an echo, both of which showed reduced EF. She continues to be short of breath and this escalated a few days ago. I recruited the assistance of [facility] failure at home program, who when to see her Friday evening. She tells me that she felt slightly better after first dose of lasix and had good UOP, however on Saturday and Sunday did not have much output and did not feel much better.   We again discussed the options for evaluating her SOB and DOE. We had initially agreed on CCTA and then eventual RHC to evaluate low EF and symptoms. With worsening of symptoms, we discussed going directly to right and left heart catheterization.   She denies current chest pain or palpitations.   Past Medical History:  Diagnosis Date  . Abdominal pain   . Allergic rhinitis   . Anemia   . Anxiety   . Arthritis   . Asthma   . Bronchitis   . Diabetes mellitus    type 2  . GERD (gastroesophageal reflux disease)   . Hyperlipidemia   . Hypertension   . Obesity   . Peripheral neuropathy    denies has carpal tunnel  . PONV (postoperative nausea and vomiting)   . Shortness of breath dyspnea    since starting Trulicity diabetic med  . Sleep apnea    uses  c-pap machine    Past Surgical History:  Procedure Laterality Date  . ABDOMINAL HYSTERECTOMY    . BREAST REDUCTION SURGERY    . HAMMER TOE SURGERY     bilateral feet  . JOINT REPLACEMENT     R TKA Dr. Wynelle Link 07-25-17  . NOSE SURGERY     sinus surgery  . ORIF ANKLE FRACTURE  06/29/2011   Procedure: OPEN REDUCTION INTERNAL FIXATION (ORIF) ANKLE FRACTURE;  Surgeon: Sharmon Revere, MD;  Location: [facility];  Service: Orthopedics;  Laterality: Right;  . right rotator cuff surgery - 11-2013    . STERIOD INJECTION Right 06/23/2015   Procedure: RIGHT KNEE CORTISONE INJECTION;  Surgeon: Gaynelle Arabian, MD;  Location: [facility];  Service: Orthopedics;  Laterality: Right;  . TOTAL KNEE ARTHROPLASTY Left 06/23/2015   Procedure: TOTAL LEFT KNEE ARTHROPLASTY;  Surgeon: Gaynelle Arabian, MD;  Location: [facility];  Service: Orthopedics;  Laterality: Left;  . TOTAL KNEE ARTHROPLASTY Right 07/25/2017   Procedure: RIGHT TOTAL KNEE ARTHROPLASTY;  Surgeon: Gaynelle Arabian, MD;  Location: [facility];  Service: Orthopedics;  Laterality: Right;  Adductor Block    Current Medications: Current Meds  Medication Sig  . albuterol (PROVENTIL HFA;VENTOLIN HFA) 108 (90 BASE) MCG/ACT inhaler Inhale 2 puffs into the lungs every 6 (six) hours as  needed for wheezing.  Marland Kitchen albuterol (PROVENTIL) (2.5 MG/3ML) 0.083% nebulizer solution Take 2.5 mg by nebulization every 4 (four) hours as needed for wheezing or shortness of breath.   . ALPRAZolam (XANAX) 0.5 MG tablet Take 0.25 mg by mouth daily as needed for anxiety.   Marland Kitchen amLODipine-olmesartan (AZOR) 5-40 MG per tablet Take 1 tablet by mouth daily.  Marland Kitchen atorvastatin (LIPITOR) 40 MG tablet Take 40 mg by mouth 3 (three) times a week. Take on MWF Only  . budesonide-formoterol (SYMBICORT) 160-4.5 MCG/ACT inhaler Inhale 2 puffs into the lungs 2 (two) times daily.  . DULoxetine (CYMBALTA) 60 MG capsule Take 60 mg by mouth 2 (two) times daily.  Marland Kitchen FERREX 150 150 MG capsule Take 150 mg by mouth 2 (two) times  daily.  . furosemide (LASIX) 20 MG tablet Take 1 tablet (20 mg total) by mouth daily.  . metoprolol succinate (TOPROL XL) 25 MG 24 hr tablet Take 1 tablet (25 mg total) by mouth daily.  . potassium chloride (KLOR-CON) 10 MEQ tablet Take 1 tablet (10 mEq total) by mouth daily.  . RABEprazole (ACIPHEX) 20 MG tablet Take 20 mg by mouth 2 (two) times daily.   Marland Kitchen SPIRIVA RESPIMAT 1.25 MCG/ACT AERS Take 1.25 mcg by mouth daily.   . sucralfate (CARAFATE) 1 g tablet Take 1 g by mouth 4 (four) times daily.  . TRULICITY 1.5 WR/6.0AV SOPN Inject 0.5 mLs into the skin once a week. On Saturdays  . valACYclovir (VALTREX) 1000 MG tablet Take 1,000 mg by mouth daily.      Allergies:   Hydrocodone and Oxycodone   Social History   Socioeconomic History  . Marital status: Married    Spouse name: Not on file  . Number of children: Not on file  . Years of education: Not on file  . Highest education level: Not on file  Occupational History  . Not on file  Tobacco Use  . Smoking status: Former Smoker    Packs/day: 0.50    Years: 30.00    Pack years: 15.00    Quit date: 10/29/2006    Years since quitting: 12.8  . Smokeless tobacco: Never Used  Vaping Use  . Vaping Use: Never used  Substance and Sexual Activity  . Alcohol use: No  . Drug use: No  . Sexual activity: Not Currently  Other Topics Concern  . Not on file  Social History Narrative  . Not on file   Social Determinants of Health   Financial Resource Strain:   . Difficulty of Paying Living Expenses:   Food Insecurity:   . Worried About Charity fundraiser in the Last Year:   . Arboriculturist in the Last Year:   Transportation Needs:   . Film/video editor (Medical):   Marland Kitchen Lack of Transportation (Non-Medical):   Physical Activity:   . Days of Exercise per Week:   . Minutes of Exercise per Session:   Stress:   . Feeling of Stress :   Social Connections:   . Frequency of Communication with Friends and Family:   . Frequency of  Social Gatherings with Friends and Family:   . Attends Religious Services:   . Active Member of Clubs or Organizations:   . Attends Archivist Meetings:   Marland Kitchen Marital Status:      Family History: The patient's family history is not on file. She was adopted.  ROS:   Please see the history of present illness.  All other systems reviewed and are negative.  EKGs/Labs/Other Studies Reviewed:    The following studies were reviewed today:  EKG:  NSR, left atrial enlargement, nonspecific T wave abnormality  Recent Labs: 05/23/2019: ALT 44; B Natriuretic Peptide 713.2; BUN 14; Creatinine, Ser 0.92; Hemoglobin 13.2; Platelets 320; Potassium 3.4; Sodium 140  Recent Lipid Panel No results found for: CHOL, TRIG, HDL, CHOLHDL, VLDL, LDLCALC, LDLDIRECT  Physical Exam:    VS:  BP 124/90 (BP Location: Right Arm, Patient Position: Sitting, Cuff Size: Large)   Pulse 90   Ht 5\' 11"  (1.803 m)   Wt (!) 305 lb (138.3 kg)   SpO2 95%   BMI 42.54 kg/m     Wt Readings from Last 5 Encounters:  09/19/19 (!) 305 lb (138.3 kg)  08/24/19 291 lb 9.6 oz (132.3 kg)  08/21/19 290 lb (131.5 kg)  07/04/19 290 lb (131.5 kg)  05/23/19 298 lb (135.2 kg)     Constitutional: No acute distress Eyes: sclera non-icteric, normal conjunctiva and lids ENMT: normal dentition, moist mucous membranes Cardiovascular: regular rhythm, normal rate, no murmurs. S1 and S2 normal. Radial pulses normal bilaterally. No jugular venous distention.  Respiratory: clear to auscultation bilaterally GI : normal bowel sounds, soft and nontender. No distention.   MSK: extremities warm, well perfused. No edema.  NEURO: grossly nonfocal exam, moves all extremities. PSYCH: alert and oriented x 3, normal mood and affect.   ASSESSMENT:    1. Acute on chronic systolic heart failure ([place])   2. Dyspnea on exertion   3. Essential hypertension   4. LVH (left ventricular hypertrophy)   5. Hyperlipidemia, unspecified  hyperlipidemia type   6. OSA (obstructive sleep apnea)    PLAN:    Acute on chronic systolic heart failure ([place])  - we discussed in detail performing a L/RHC to better understand the nature of her reduced EF as well as DOE that is unresponsive to IV diuretic therapy. Risks, benefits and alternatives discussed at length. Patient is willing to proceed. INFORMED CONSENT: I have reviewed the risks, indications, and alternatives to cardiac catheterization, possible angioplasty, and stenting with the patient. Risks include but are not limited to bleeding, infection, vascular injury, stroke, myocardial infection, arrhythmia, kidney injury, radiation-related injury in the case of prolonged fluoroscopy use, emergency cardiac surgery, and death. The patient understands the risks of serious complication is 1-2 in 6222 with diagnostic cardiac cath and 1-2% or less with angioplasty/stenting.    Dyspnea on exertion  Essential hypertension - continue amlodipine olmesartan, metoprolol, lasix.   LVH (left ventricular hypertrophy)  Hyperlipidemia, unspecified hyperlipidemia type - continue atorvastatin  OSA (obstructive sleep apnea)  Total time of encounter: 35 minutes total time of encounter, including 25 minutes spent in face-to-face patient care on the date of this encounter. This time includes coordination of care and counseling regarding above mentioned problem list. Remainder of non-face-to-face time involved reviewing chart documents/testing relevant to the patient encounter and documentation in the medical record. I have independently reviewed documentation from referring provider.   Cherlynn Kaiser, MD [place]  [facility]    Medication Adjustments/Labs and Tests Ordered: Current medicines are reviewed at length with the patient today.  Concerns regarding medicines are outlined above.  Orders Placed This Encounter  Procedures  . Basic metabolic panel  . CBC  . EKG 12-Lead   No  orders of the defined types were placed in this encounter.   Patient Instructions  Medication Instructions:   no changes  *If you need  a refill on your cardiac medications before your next appointment, please call your pharmacy*   Lab Work: Bmp Cbc - today for cardiac cath  If you have labs (blood work) drawn today and your tests are completely normal, you will receive your results only by: Marland Kitchen MyChart Message (if you have MyChart) OR . A paper copy in the mail If you have any lab test that is abnormal or we need to change your treatment, we will call you to review the results.   Testing/Procedures: Will be schedule at [facility] hospital- cardiac Cath lab Your physician has requested that you have a cardiac catheterization. Cardiac catheterization is used to diagnose and/or treat various heart conditions. Doctors may recommend this procedure for a number of different reasons. The most common reason is to evaluate chest pain. Chest pain can be a symptom of coronary artery disease (CAD), and cardiac catheterization can show whether plaque is narrowing or blocking your heart's arteries. This procedure is also used to evaluate the valves, as well as measure the blood flow and oxygen levels in different parts of your heart. For further information please visit HugeFiesta.[email or web address]. Please follow instruction sheet, as given.    Follow-Up: At [facility], you and your health needs are our priority.  As part of our continuing mission to provide you with exceptional heart care, we have created designated Provider Care Teams.  These Care Teams include your primary Cardiologist (physician) and Advanced Practice Providers (APPs -  Physician Assistants and Nurse Practitioners) who all work together to provide you with the care you need, when you need it.     Your next appointment:   4 week(s)  The format for your next appointment:   In Person  Provider:   Cherlynn Kaiser, MD   Other  Instructions      [place] [address] Dept: [phone] Loc: [place]  09/19/2019  You are scheduled for a Cardiac Catheterization on Thursday, July 15 with Dr. Peter Martinique.  1. Please arrive at the [facility] (Main Entrance A) at [facility]: [address] [place], [place] [address] at 5:30 AM (This time is two hours before your procedure to ensure your preparation). Free valet parking service is available.   Special note: Every effort is made to have your procedure done on time. Please understand that emergencies sometimes delay scheduled procedures.  2. Diet: Do not eat solid foods after midnight.  The patient may have clear liquids until 5am upon the day of the procedure.  3. Labs: You will need to have blood drawn  Today  -BMP , CBC . You do not need to be fasting.  4. Medication instructions in preparation for your procedure:   Contrast Allergy: No   DO NOT TAKE YOUR FUROSEMIDE THE MORNING OF  THE TEST    On the morning of your procedure, take your Aspirin  81 and any morning medicines NOT listed above.  You may use sips of water.  5. Plan for one night stay--bring personal belongings. 6. Bring a current list of your medications and current insurance cards. 7. You MUST have a responsible person to drive you home. 8. Someone MUST be with you the first 24 hours after you arrive home or your discharge will be delayed. 9. Please wear clothes that are easy to get on and off and wear slip-on shoes.  Thank you for allowing  [place] to care for you!   -- [place] [facility]

## 2019-09-19 NOTE — Patient Instructions (Signed)
Medication Instructions:   no changes  *If you need a refill on your cardiac medications before your next appointment, please call your pharmacy*   Lab Work: Bmp Cbc - today for cardiac cath  If you have labs (blood work) drawn today and your tests are completely normal, you will receive your results only by: Marland Kitchen MyChart Message (if you have MyChart) OR . A paper copy in the mail If you have any lab test that is abnormal or we need to change your treatment, we will call you to review the results.   Testing/Procedures: Will be schedule at [facility] hospital- cardiac Cath lab Your physician has requested that you have a cardiac catheterization. Cardiac catheterization is used to diagnose and/or treat various heart conditions. Doctors may recommend this procedure for a number of different reasons. The most common reason is to evaluate chest pain. Chest pain can be a symptom of coronary artery disease (CAD), and cardiac catheterization can show whether plaque is narrowing or blocking your heart's arteries. This procedure is also used to evaluate the valves, as well as measure the blood flow and oxygen levels in different parts of your heart. For further information please visit HugeFiesta.[email or web address]. Please follow instruction sheet, as given.    Follow-Up: At [facility], you and your health needs are our priority.  As part of our continuing mission to provide you with exceptional heart care, we have created designated Provider Care Teams.  These Care Teams include your primary Cardiologist (physician) and Advanced Practice Providers (APPs -  Physician Assistants and Nurse Practitioners) who all work together to provide you with the care you need, when you need it.     Your next appointment:   4 week(s)  The format for your next appointment:   In Person  Provider:   Cherlynn Kaiser, MD   Other Instructions      [place] [address] Dept: [phone] Loc: [place]  09/19/2019  You are scheduled for a Cardiac Catheterization on Thursday, July 15 with Dr. Peter Martinique.  1. Please arrive at the [facility] (Main Entrance A) at [facility]: [address] [place], [place] [address] at 5:30 AM (This time is two hours before your procedure to ensure your preparation). Free valet parking service is available.   Special note: Every effort is made to have your procedure done on time. Please understand that emergencies sometimes delay scheduled procedures.  2. Diet: Do not eat solid foods after midnight.  The patient may have clear liquids until 5am upon the day of the procedure.  3. Labs: You will need to have blood drawn  Today  -BMP , CBC . You do not need to be fasting.  4. Medication instructions in preparation for your procedure:   Contrast Allergy: No   DO NOT TAKE YOUR FUROSEMIDE THE MORNING OF  THE TEST    On the morning of your procedure, take your Aspirin  81 and any morning medicines NOT listed above.  You may use sips of water.  5. Plan for one night stay--bring personal belongings. 6. Bring a current list of your medications and current insurance cards. 7. You MUST have a responsible person to drive you home. 8. Someone MUST be with you the first 24 hours after you arrive home or your discharge will be delayed. 9. Please wear clothes that are easy to get on and  off and wear slip-on shoes.  Thank you for allowing [place] to care for you!   --  Invasive Cardiovascular services

## 2019-09-19 NOTE — H&P (View-Only) (Signed)
Cardiology Office Note:    Date:  09/19/2019   ID:  Alexandria Ruiz, DOB June 13, 1955, MRN [id number]  PCP:  Maury Dus, MD  Cardiologist:  Elouise Munroe, MD  Electrophysiologist:  None   Referring MD: Maury Dus, MD   Chief Complaint: DOE  History of Present Illness:    Alexandria Ruiz is a 64 y.o. female with a history of HTN, HLD, DM2, OSA on CPAP.  She presents today for evaluation of shortness of breath and dyspnea on exertion.  She tells me that 5 years ago she was evaluated for shortness of breath, and testing was unremarkable and no follow-up was recommended.  About 8 months ago her shortness of breath started again, she feels it escalated in January or February 2021.   We performed a nuclear perfusion stress test and an echo, both of which showed reduced EF. She continues to be short of breath and this escalated a few days ago. I recruited the assistance of [facility] failure at home program, who when to see her Friday evening. She tells me that she felt slightly better after first dose of lasix and had good UOP, however on Saturday and Sunday did not have much output and did not feel much better.   We again discussed the options for evaluating her SOB and DOE. We had initially agreed on CCTA and then eventual RHC to evaluate low EF and symptoms. With worsening of symptoms, we discussed going directly to right and left heart catheterization.   She denies current chest pain or palpitations.   Past Medical History:  Diagnosis Date  . Abdominal pain   . Allergic rhinitis   . Anemia   . Anxiety   . Arthritis   . Asthma   . Bronchitis   . Diabetes mellitus    type 2  . GERD (gastroesophageal reflux disease)   . Hyperlipidemia   . Hypertension   . Obesity   . Peripheral neuropathy    denies has carpal tunnel  . PONV (postoperative nausea and vomiting)   . Shortness of breath dyspnea    since starting Trulicity diabetic med  . Sleep apnea    uses  c-pap machine    Past Surgical History:  Procedure Laterality Date  . ABDOMINAL HYSTERECTOMY    . BREAST REDUCTION SURGERY    . HAMMER TOE SURGERY     bilateral feet  . JOINT REPLACEMENT     R TKA Dr. Wynelle Link 07-25-17  . NOSE SURGERY     sinus surgery  . ORIF ANKLE FRACTURE  06/29/2011   Procedure: OPEN REDUCTION INTERNAL FIXATION (ORIF) ANKLE FRACTURE;  Surgeon: Sharmon Revere, MD;  Location: [facility];  Service: Orthopedics;  Laterality: Right;  . right rotator cuff surgery - 11-2013    . STERIOD INJECTION Right 06/23/2015   Procedure: RIGHT KNEE CORTISONE INJECTION;  Surgeon: Gaynelle Arabian, MD;  Location: [facility];  Service: Orthopedics;  Laterality: Right;  . TOTAL KNEE ARTHROPLASTY Left 06/23/2015   Procedure: TOTAL LEFT KNEE ARTHROPLASTY;  Surgeon: Gaynelle Arabian, MD;  Location: [facility];  Service: Orthopedics;  Laterality: Left;  . TOTAL KNEE ARTHROPLASTY Right 07/25/2017   Procedure: RIGHT TOTAL KNEE ARTHROPLASTY;  Surgeon: Gaynelle Arabian, MD;  Location: [facility];  Service: Orthopedics;  Laterality: Right;  Adductor Block    Current Medications: Current Meds  Medication Sig  . albuterol (PROVENTIL HFA;VENTOLIN HFA) 108 (90 BASE) MCG/ACT inhaler Inhale 2 puffs into the lungs every 6 (six) hours as  needed for wheezing.  Marland Kitchen albuterol (PROVENTIL) (2.5 MG/3ML) 0.083% nebulizer solution Take 2.5 mg by nebulization every 4 (four) hours as needed for wheezing or shortness of breath.   . ALPRAZolam (XANAX) 0.5 MG tablet Take 0.25 mg by mouth daily as needed for anxiety.   Marland Kitchen amLODipine-olmesartan (AZOR) 5-40 MG per tablet Take 1 tablet by mouth daily.  Marland Kitchen atorvastatin (LIPITOR) 40 MG tablet Take 40 mg by mouth 3 (three) times a week. Take on MWF Only  . budesonide-formoterol (SYMBICORT) 160-4.5 MCG/ACT inhaler Inhale 2 puffs into the lungs 2 (two) times daily.  . DULoxetine (CYMBALTA) 60 MG capsule Take 60 mg by mouth 2 (two) times daily.  Marland Kitchen FERREX 150 150 MG capsule Take 150 mg by mouth 2 (two) times  daily.  . furosemide (LASIX) 20 MG tablet Take 1 tablet (20 mg total) by mouth daily.  . metoprolol succinate (TOPROL XL) 25 MG 24 hr tablet Take 1 tablet (25 mg total) by mouth daily.  . potassium chloride (KLOR-CON) 10 MEQ tablet Take 1 tablet (10 mEq total) by mouth daily.  . RABEprazole (ACIPHEX) 20 MG tablet Take 20 mg by mouth 2 (two) times daily.   Marland Kitchen SPIRIVA RESPIMAT 1.25 MCG/ACT AERS Take 1.25 mcg by mouth daily.   . sucralfate (CARAFATE) 1 g tablet Take 1 g by mouth 4 (four) times daily.  . TRULICITY 1.5 JJ/0.0XF SOPN Inject 0.5 mLs into the skin once a week. On Saturdays  . valACYclovir (VALTREX) 1000 MG tablet Take 1,000 mg by mouth daily.      Allergies:   Hydrocodone and Oxycodone   Social History   Socioeconomic History  . Marital status: Married    Spouse name: Not on file  . Number of children: Not on file  . Years of education: Not on file  . Highest education level: Not on file  Occupational History  . Not on file  Tobacco Use  . Smoking status: Former Smoker    Packs/day: 0.50    Years: 30.00    Pack years: 15.00    Quit date: 10/29/2006    Years since quitting: 12.8  . Smokeless tobacco: Never Used  Vaping Use  . Vaping Use: Never used  Substance and Sexual Activity  . Alcohol use: No  . Drug use: No  . Sexual activity: Not Currently  Other Topics Concern  . Not on file  Social History Narrative  . Not on file   Social Determinants of Health   Financial Resource Strain:   . Difficulty of Paying Living Expenses:   Food Insecurity:   . Worried About Charity fundraiser in the Last Year:   . Arboriculturist in the Last Year:   Transportation Needs:   . Film/video editor (Medical):   Marland Kitchen Lack of Transportation (Non-Medical):   Physical Activity:   . Days of Exercise per Week:   . Minutes of Exercise per Session:   Stress:   . Feeling of Stress :   Social Connections:   . Frequency of Communication with Friends and Family:   . Frequency of  Social Gatherings with Friends and Family:   . Attends Religious Services:   . Active Member of Clubs or Organizations:   . Attends Archivist Meetings:   Marland Kitchen Marital Status:      Family History: The patient's family history is not on file. She was adopted.  ROS:   Please see the history of present illness.  All other systems reviewed and are negative.  EKGs/Labs/Other Studies Reviewed:    The following studies were reviewed today:  EKG:  NSR, left atrial enlargement, nonspecific T wave abnormality  Recent Labs: 05/23/2019: ALT 44; B Natriuretic Peptide 713.2; BUN 14; Creatinine, Ser 0.92; Hemoglobin 13.2; Platelets 320; Potassium 3.4; Sodium 140  Recent Lipid Panel No results found for: CHOL, TRIG, HDL, CHOLHDL, VLDL, LDLCALC, LDLDIRECT  Physical Exam:    VS:  BP 124/90 (BP Location: Right Arm, Patient Position: Sitting, Cuff Size: Large)   Pulse 90   Ht 5\' 11"  (1.803 m)   Wt (!) 305 lb (138.3 kg)   SpO2 95%   BMI 42.54 kg/m     Wt Readings from Last 5 Encounters:  09/19/19 (!) 305 lb (138.3 kg)  08/24/19 291 lb 9.6 oz (132.3 kg)  08/21/19 290 lb (131.5 kg)  07/04/19 290 lb (131.5 kg)  05/23/19 298 lb (135.2 kg)     Constitutional: No acute distress Eyes: sclera non-icteric, normal conjunctiva and lids ENMT: normal dentition, moist mucous membranes Cardiovascular: regular rhythm, normal rate, no murmurs. S1 and S2 normal. Radial pulses normal bilaterally. No jugular venous distention.  Respiratory: clear to auscultation bilaterally GI : normal bowel sounds, soft and nontender. No distention.   MSK: extremities warm, well perfused. No edema.  NEURO: grossly nonfocal exam, moves all extremities. PSYCH: alert and oriented x 3, normal mood and affect.   ASSESSMENT:    1. Acute on chronic systolic heart failure ([place])   2. Dyspnea on exertion   3. Essential hypertension   4. LVH (left ventricular hypertrophy)   5. Hyperlipidemia, unspecified  hyperlipidemia type   6. OSA (obstructive sleep apnea)    PLAN:    Acute on chronic systolic heart failure ([place])  - we discussed in detail performing a L/RHC to better understand the nature of her reduced EF as well as DOE that is unresponsive to IV diuretic therapy. Risks, benefits and alternatives discussed at length. Patient is willing to proceed. INFORMED CONSENT: I have reviewed the risks, indications, and alternatives to cardiac catheterization, possible angioplasty, and stenting with the patient. Risks include but are not limited to bleeding, infection, vascular injury, stroke, myocardial infection, arrhythmia, kidney injury, radiation-related injury in the case of prolonged fluoroscopy use, emergency cardiac surgery, and death. The patient understands the risks of serious complication is 1-2 in 7829 with diagnostic cardiac cath and 1-2% or less with angioplasty/stenting.    Dyspnea on exertion  Essential hypertension - continue amlodipine olmesartan, metoprolol, lasix.   LVH (left ventricular hypertrophy)  Hyperlipidemia, unspecified hyperlipidemia type - continue atorvastatin  OSA (obstructive sleep apnea)  Total time of encounter: 35 minutes total time of encounter, including 25 minutes spent in face-to-face patient care on the date of this encounter. This time includes coordination of care and counseling regarding above mentioned problem list. Remainder of non-face-to-face time involved reviewing chart documents/testing relevant to the patient encounter and documentation in the medical record. I have independently reviewed documentation from referring provider.   Cherlynn Kaiser, MD [place]  [facility]    Medication Adjustments/Labs and Tests Ordered: Current medicines are reviewed at length with the patient today.  Concerns regarding medicines are outlined above.  Orders Placed This Encounter  Procedures  . Basic metabolic panel  . CBC  . EKG 12-Lead   No  orders of the defined types were placed in this encounter.   Patient Instructions  Medication Instructions:   no changes  *If you need  a refill on your cardiac medications before your next appointment, please call your pharmacy*   Lab Work: Bmp Cbc - today for cardiac cath  If you have labs (blood work) drawn today and your tests are completely normal, you will receive your results only by: Marland Kitchen MyChart Message (if you have MyChart) OR . A paper copy in the mail If you have any lab test that is abnormal or we need to change your treatment, we will call you to review the results.   Testing/Procedures: Will be schedule at [facility] hospital- cardiac Cath lab Your physician has requested that you have a cardiac catheterization. Cardiac catheterization is used to diagnose and/or treat various heart conditions. Doctors may recommend this procedure for a number of different reasons. The most common reason is to evaluate chest pain. Chest pain can be a symptom of coronary artery disease (CAD), and cardiac catheterization can show whether plaque is narrowing or blocking your heart's arteries. This procedure is also used to evaluate the valves, as well as measure the blood flow and oxygen levels in different parts of your heart. For further information please visit [email or web address]. Please follow instruction sheet, as given.    Follow-Up: At [facility], you and your health needs are our priority.  As part of our continuing mission to provide you with exceptional heart care, we have created designated Provider Care Teams.  These Care Teams include your primary Cardiologist (physician) and Advanced Practice Providers (APPs -  Physician Assistants and Nurse Practitioners) who all work together to provide you with the care you need, when you need it.     Your next appointment:   4 week(s)  The format for your next appointment:   In Person  Provider:   Cherlynn Kaiser, MD   Other  Instructions      [address] [place] [address] Dept: [phone] Loc: [place]  09/19/2019  You are scheduled for a Cardiac Catheterization on Thursday, July 15 with Dr. Peter Martinique.  1. Please arrive at the [facility] (Main Entrance A) at [facility]: [address] [place], [place] [address] at 5:30 AM (This time is two hours before your procedure to ensure your preparation). Free valet parking service is available.   Special note: Every effort is made to have your procedure done on time. Please understand that emergencies sometimes delay scheduled procedures.  2. Diet: Do not eat solid foods after midnight.  The patient may have clear liquids until 5am upon the day of the procedure.  3. Labs: You will need to have blood drawn  Today  -BMP , CBC . You do not need to be fasting.  4. Medication instructions in preparation for your procedure:   Contrast Allergy: No   DO NOT TAKE YOUR FUROSEMIDE THE MORNING OF  THE TEST    On the morning of your procedure, take your Aspirin  81 and any morning medicines NOT listed above.  You may use sips of water.  5. Plan for one night stay--bring personal belongings. 6. Bring a current list of your medications and current insurance cards. 7. You MUST have a responsible person to drive you home. 8. Someone MUST be with you the first 24 hours after you arrive home or your discharge will be delayed. 9. Please wear clothes that are easy to get on and off and wear slip-on shoes.  Thank you for allowing  [place] to care for you!   -- [place] [facility]

## 2019-09-20 LAB — CBC
Hematocrit: 37.2 % (ref 34.0–46.6)
Hemoglobin: 12.3 g/dL (ref 11.1–15.9)
MCH: 29.5 pg (ref 26.6–33.0)
MCHC: 33.1 g/dL (ref 31.5–35.7)
MCV: 89 fL (ref 79–97)
Platelets: 262 10*3/uL (ref 150–450)
RBC: 4.17 x10E6/uL (ref 3.77–5.28)
RDW: 15.4 % (ref 11.7–15.4)
WBC: 8.8 10*3/uL (ref 3.4–10.8)

## 2019-09-20 LAB — BASIC METABOLIC PANEL
BUN/Creatinine Ratio: 15 (ref 12–28)
BUN: 19 mg/dL (ref 8–27)
CO2: 23 mmol/L (ref 20–29)
Calcium: 9.4 mg/dL (ref 8.7–10.3)
Chloride: 103 mmol/L (ref 96–106)
Creatinine, Ser: 1.3 mg/dL — ABNORMAL HIGH (ref 0.57–1.00)
GFR calc Af Amer: 51 mL/min/{1.73_m2} — ABNORMAL LOW (ref >59)
GFR calc non Af Amer: 44 mL/min/{1.73_m2} — ABNORMAL LOW (ref >59)
Glucose: 96 mg/dL (ref 65–99)
Potassium: 4.2 mmol/L (ref 3.5–5.2)
Sodium: 141 mmol/L (ref 134–144)

## 2019-09-24 ENCOUNTER — Other Ambulatory Visit: Payer: Self-pay | Admitting: *Deleted

## 2019-09-24 DIAGNOSIS — R0602 Shortness of breath: Secondary | ICD-10-CM

## 2019-09-24 DIAGNOSIS — R0609 Other forms of dyspnea: Secondary | ICD-10-CM

## 2019-09-24 DIAGNOSIS — R0600 Dyspnea, unspecified: Secondary | ICD-10-CM

## 2019-09-24 DIAGNOSIS — I5023 Acute on chronic systolic (congestive) heart failure: Secondary | ICD-10-CM

## 2019-09-24 NOTE — Progress Notes (Signed)
ORDER PLACED

## 2019-09-25 ENCOUNTER — Telehealth: Payer: Self-pay | Admitting: *Deleted

## 2019-09-25 NOTE — Telephone Encounter (Addendum)
Pt contacted pre-catheterization scheduled at [facility] for: Thursday September 27, 2019 7:30 AM Verified arrival time and place: [facility]) at: 5:30 AM   No solid food after midnight prior to cath, clear liquids until 5 AM day of procedure.  Hold: Lasix/KCl-AM of procedure  Except hold medications AM meds can be  taken pre-cath with sips of water including: ASA 81 mg   Confirmed patient has responsible adult to drive home post procedure and observe 24 hours after arriving home: yes  You are allowed ONE visitor in the waiting room during your procedure. Both you and your visitor must wear a mask once you enter the hospital.      COVID-19 Pre-Screening Questions:  . In the past 7 to 10 days have you had a new cough, shortness of breath, headache, congestion, fever (100 or greater) unexplained body aches, new sore throat, or sudden loss of taste or sense of smell? no . In the past 7 to 10 days have you been around anyone with known Covid 19? no   Reviewed procedure/mask/visitor instructions, COVID-19 screening questions with patient.  Pt states she is fully vaccinated, see immunization history.

## 2019-09-26 ENCOUNTER — Ambulatory Visit (HOSPITAL_COMMUNITY): Payer: 59

## 2019-09-27 ENCOUNTER — Encounter (HOSPITAL_COMMUNITY): Payer: Self-pay | Admitting: Cardiology

## 2019-09-27 ENCOUNTER — Inpatient Hospital Stay: Payer: Self-pay

## 2019-09-27 ENCOUNTER — Inpatient Hospital Stay (HOSPITAL_COMMUNITY)
Admission: RE | Admit: 2019-09-27 | Discharge: 2019-09-29 | DRG: 287 | Disposition: A | Discharge status: Home / Self Care | Payer: 59 | Source: Ambulatory Visit | Attending: Cardiology | Admitting: Cardiology

## 2019-09-27 ENCOUNTER — Encounter (HOSPITAL_COMMUNITY)
Admission: RE | Disposition: A | Discharge status: Home / Self Care | Payer: Self-pay | Source: Ambulatory Visit | Attending: Cardiology

## 2019-09-27 DIAGNOSIS — I5043 Acute on chronic combined systolic (congestive) and diastolic (congestive) heart failure: Secondary | ICD-10-CM | POA: Diagnosis present

## 2019-09-27 DIAGNOSIS — R0600 Dyspnea, unspecified: Secondary | ICD-10-CM | POA: Diagnosis present

## 2019-09-27 DIAGNOSIS — Z6841 Body Mass Index (BMI) 40.0 and over, adult: Secondary | ICD-10-CM

## 2019-09-27 DIAGNOSIS — G4733 Obstructive sleep apnea (adult) (pediatric): Secondary | ICD-10-CM | POA: Diagnosis present

## 2019-09-27 DIAGNOSIS — I5023 Acute on chronic systolic (congestive) heart failure: Secondary | ICD-10-CM

## 2019-09-27 DIAGNOSIS — E119 Type 2 diabetes mellitus without complications: Secondary | ICD-10-CM

## 2019-09-27 DIAGNOSIS — E669 Obesity, unspecified: Secondary | ICD-10-CM | POA: Diagnosis present

## 2019-09-27 DIAGNOSIS — Z885 Allergy status to narcotic agent status: Secondary | ICD-10-CM

## 2019-09-27 DIAGNOSIS — I110 Hypertensive heart disease with heart failure: Secondary | ICD-10-CM | POA: Diagnosis present

## 2019-09-27 DIAGNOSIS — I272 Pulmonary hypertension, unspecified: Secondary | ICD-10-CM | POA: Diagnosis present

## 2019-09-27 DIAGNOSIS — I428 Other cardiomyopathies: Secondary | ICD-10-CM | POA: Diagnosis present

## 2019-09-27 DIAGNOSIS — K219 Gastro-esophageal reflux disease without esophagitis: Secondary | ICD-10-CM | POA: Diagnosis present

## 2019-09-27 DIAGNOSIS — E785 Hyperlipidemia, unspecified: Secondary | ICD-10-CM | POA: Diagnosis present

## 2019-09-27 DIAGNOSIS — Z96653 Presence of artificial knee joint, bilateral: Secondary | ICD-10-CM | POA: Diagnosis present

## 2019-09-27 DIAGNOSIS — J45909 Unspecified asthma, uncomplicated: Secondary | ICD-10-CM | POA: Diagnosis present

## 2019-09-27 DIAGNOSIS — I10 Essential (primary) hypertension: Secondary | ICD-10-CM

## 2019-09-27 DIAGNOSIS — R0609 Other forms of dyspnea: Secondary | ICD-10-CM

## 2019-09-27 DIAGNOSIS — Z87891 Personal history of nicotine dependence: Secondary | ICD-10-CM | POA: Diagnosis not present

## 2019-09-27 DIAGNOSIS — Z79899 Other long term (current) drug therapy: Secondary | ICD-10-CM | POA: Diagnosis not present

## 2019-09-27 DIAGNOSIS — I517 Cardiomegaly: Secondary | ICD-10-CM | POA: Diagnosis present

## 2019-09-27 DIAGNOSIS — E1142 Type 2 diabetes mellitus with diabetic polyneuropathy: Secondary | ICD-10-CM | POA: Diagnosis present

## 2019-09-27 DIAGNOSIS — I161 Hypertensive emergency: Secondary | ICD-10-CM | POA: Diagnosis present

## 2019-09-27 DIAGNOSIS — R0602 Shortness of breath: Secondary | ICD-10-CM

## 2019-09-27 HISTORY — PX: RIGHT/LEFT HEART CATH AND CORONARY ANGIOGRAPHY: CATH118266

## 2019-09-27 LAB — POCT I-STAT EG7
Acid-base deficit: 2 mmol/L (ref 0.0–2.0)
Acid-base deficit: 3 mmol/L — ABNORMAL HIGH (ref 0.0–2.0)
Bicarbonate: 21.4 mmol/L (ref 20.0–28.0)
Bicarbonate: 22.8 mmol/L (ref 20.0–28.0)
Calcium, Ion: 1.16 mmol/L (ref 1.15–1.40)
Calcium, Ion: 1.25 mmol/L (ref 1.15–1.40)
HCT: 42 % (ref 36.0–46.0)
HCT: 44 % (ref 36.0–46.0)
Hemoglobin: 14.3 g/dL (ref 12.0–15.0)
Hemoglobin: 15 g/dL (ref 12.0–15.0)
O2 Saturation: 31 %
O2 Saturation: 35 %
Potassium: 4 mmol/L (ref 3.5–5.1)
Potassium: 4.3 mmol/L (ref 3.5–5.1)
Sodium: 142 mmol/L (ref 135–145)
Sodium: 143 mmol/L (ref 135–145)
TCO2: 22 mmol/L (ref 22–32)
TCO2: 24 mmol/L (ref 22–32)
pCO2, Ven: 36.4 mmHg — ABNORMAL LOW (ref 44.0–60.0)
pCO2, Ven: 37.1 mmHg — ABNORMAL LOW (ref 44.0–60.0)
pH, Ven: 7.376 (ref 7.250–7.430)
pH, Ven: 7.397 (ref 7.250–7.430)
pO2, Ven: 20 mmHg — CL (ref 32.0–45.0)
pO2, Ven: 21 mmHg — CL (ref 32.0–45.0)

## 2019-09-27 LAB — MAGNESIUM
Magnesium: 1.9 mg/dL (ref 1.7–2.4)
Magnesium: 2.3 mg/dL (ref 1.7–2.4)

## 2019-09-27 LAB — CBC
HCT: 37.5 % (ref 36.0–46.0)
Hemoglobin: 11.6 g/dL — ABNORMAL LOW (ref 12.0–15.0)
MCH: 28.6 pg (ref 26.0–34.0)
MCHC: 30.9 g/dL (ref 30.0–36.0)
MCV: 92.4 fL (ref 80.0–100.0)
Platelets: 262 10*3/uL (ref 150–400)
RBC: 4.06 MIL/uL (ref 3.87–5.11)
RDW: 15.9 % — ABNORMAL HIGH (ref 11.5–15.5)
WBC: 7.1 10*3/uL (ref 4.0–10.5)
nRBC: 0 % (ref 0.0–0.2)

## 2019-09-27 LAB — BASIC METABOLIC PANEL
Anion gap: 10 (ref 5–15)
Anion gap: 10 (ref 5–15)
BUN: 14 mg/dL (ref 8–23)
BUN: 15 mg/dL (ref 8–23)
CO2: 23 mmol/L (ref 22–32)
CO2: 22 mmol/L (ref 22–32)
Calcium: 8.9 mg/dL (ref 8.9–10.3)
Calcium: 8.8 mg/dL — ABNORMAL LOW (ref 8.9–10.3)
Chloride: 108 mmol/L (ref 98–111)
Chloride: 105 mmol/L (ref 98–111)
Creatinine, Ser: 1.1 mg/dL — ABNORMAL HIGH (ref 0.44–1.00)
Creatinine, Ser: 1.21 mg/dL — ABNORMAL HIGH (ref 0.44–1.00)
GFR calc Af Amer: >60 mL/min (ref >60)
GFR calc Af Amer: 56 mL/min — ABNORMAL LOW (ref >60)
GFR calc non Af Amer: 54 mL/min — ABNORMAL LOW (ref >60)
GFR calc non Af Amer: 48 mL/min — ABNORMAL LOW (ref >60)
Glucose, Bld: 198 mg/dL — ABNORMAL HIGH (ref 70–99)
Glucose, Bld: 174 mg/dL — ABNORMAL HIGH (ref 70–99)
Potassium: 3.8 mmol/L (ref 3.5–5.1)
Potassium: 3.7 mmol/L (ref 3.5–5.1)
Sodium: 141 mmol/L (ref 135–145)
Sodium: 137 mmol/L (ref 135–145)

## 2019-09-27 LAB — HEMOGLOBIN A1C
Hgb A1c MFr Bld: 5.9 % — ABNORMAL HIGH (ref 4.8–5.6)
Mean Plasma Glucose: 122.63 mg/dL

## 2019-09-27 LAB — POCT I-STAT 7, (LYTES, BLD GAS, ICA,H+H)
Acid-base deficit: 4 mmol/L — ABNORMAL HIGH (ref 0.0–2.0)
Bicarbonate: 19.8 mmol/L — ABNORMAL LOW (ref 20.0–28.0)
Calcium, Ion: 1.25 mmol/L (ref 1.15–1.40)
HCT: 44 % (ref 36.0–46.0)
Hemoglobin: 15 g/dL (ref 12.0–15.0)
O2 Saturation: 93 %
Potassium: 4.3 mmol/L (ref 3.5–5.1)
Sodium: 142 mmol/L (ref 135–145)
TCO2: 21 mmol/L — ABNORMAL LOW (ref 22–32)
pCO2 arterial: 32.8 mmHg (ref 32.0–48.0)
pH, Arterial: 7.389 (ref 7.350–7.450)
pO2, Arterial: 67 mmHg — ABNORMAL LOW (ref 83.0–108.0)

## 2019-09-27 LAB — COOXEMETRY PANEL
Carboxyhemoglobin: 2 % — ABNORMAL HIGH (ref 0.5–1.5)
Methemoglobin: 0.9 % (ref 0.0–1.5)
O2 Saturation: 73.4 %
Total hemoglobin: 12.1 g/dL (ref 12.0–16.0)

## 2019-09-27 LAB — GLUCOSE, CAPILLARY
Glucose-Capillary: 147 mg/dL — ABNORMAL HIGH (ref 70–99)
Glucose-Capillary: 134 mg/dL — ABNORMAL HIGH (ref 70–99)
Glucose-Capillary: 94 mg/dL (ref 70–99)
Glucose-Capillary: 136 mg/dL — ABNORMAL HIGH (ref 70–99)
Glucose-Capillary: 133 mg/dL — ABNORMAL HIGH (ref 70–99)

## 2019-09-27 LAB — T4, FREE: Free T4: 1.66 ng/dL — ABNORMAL HIGH (ref 0.61–1.12)

## 2019-09-27 LAB — TSH: TSH: 1.215 u[IU]/mL (ref 0.350–4.500)

## 2019-09-27 LAB — MRSA PCR SCREENING: MRSA by PCR: NEGATIVE

## 2019-09-27 SURGERY — RIGHT/LEFT HEART CATH AND CORONARY ANGIOGRAPHY
Anesthesia: LOCAL

## 2019-09-27 MED ORDER — MILRINONE LACTATE IN DEXTROSE 20-5 MG/100ML-% IV SOLN
0.2500 ug/kg/min | INTRAVENOUS | Status: DC
  Administered 2019-09-27 – 2019-09-28 (×3): 0.25 ug/kg/min via INTRAVENOUS
  Filled 2019-09-27 (×3): qty 100

## 2019-09-27 MED ORDER — SODIUM CHLORIDE 0.9% FLUSH
3.0000 mL | INTRAVENOUS | Status: DC | PRN
  Administered 2019-09-28: 3 mL via INTRAVENOUS

## 2019-09-27 MED ORDER — HEPARIN (PORCINE) IN NACL 1000-0.9 UT/500ML-% IV SOLN
INTRAVENOUS | Status: AC
  Filled 2019-09-27: qty 1000

## 2019-09-27 MED ORDER — FUROSEMIDE 10 MG/ML IJ SOLN: 80.0000 mg | Freq: Two times a day (BID) | INTRAMUSCULAR | Status: DC

## 2019-09-27 MED ORDER — SODIUM CHLORIDE 0.9% FLUSH
3.0000 mL | Freq: Two times a day (BID) | INTRAVENOUS | Status: DC
  Administered 2019-09-27 – 2019-09-28 (×4): 3 mL via INTRAVENOUS

## 2019-09-27 MED ORDER — FUROSEMIDE 10 MG/ML IJ SOLN
40.0000 mg | Freq: Two times a day (BID) | INTRAMUSCULAR | Status: DC
  Administered 2019-09-27: 40 mg via INTRAVENOUS
  Filled 2019-09-27: qty 4

## 2019-09-27 MED ORDER — MIDAZOLAM HCL 2 MG/2ML IJ SOLN
INTRAMUSCULAR | Status: AC
  Filled 2019-09-27: qty 2

## 2019-09-27 MED ORDER — FENTANYL CITRATE (PF) 100 MCG/2ML IJ SOLN
INTRAMUSCULAR | Status: AC
  Filled 2019-09-27: qty 2

## 2019-09-27 MED ORDER — VERAPAMIL HCL 2.5 MG/ML IV SOLN
INTRAVENOUS | Status: DC | PRN
  Administered 2019-09-27: 10 mL via INTRA_ARTERIAL

## 2019-09-27 MED ORDER — NITROGLYCERIN IN D5W 200-5 MCG/ML-% IV SOLN
0.0000 ug/min | INTRAVENOUS | Status: DC
  Administered 2019-09-27: 50 ug/min via INTRAVENOUS

## 2019-09-27 MED ORDER — TIOTROPIUM BROMIDE MONOHYDRATE 1.25 MCG/ACT IN AERS: 2.0000 | INHALATION_SPRAY | Freq: Every day | RESPIRATORY_TRACT | Status: DC

## 2019-09-27 MED ORDER — HEPARIN SODIUM (PORCINE) 1000 UNIT/ML IJ SOLN
INTRAMUSCULAR | Status: DC | PRN
  Administered 2019-09-27: 6000 [IU] via INTRAVENOUS

## 2019-09-27 MED ORDER — ESTRADIOL 1 MG PO TABS
1.0000 mg | ORAL_TABLET | Freq: Every day | ORAL | Status: DC
  Administered 2019-09-27: 1 mg via ORAL
  Filled 2019-09-27 (×4): qty 1

## 2019-09-27 MED ORDER — HEPARIN (PORCINE) IN NACL 1000-0.9 UT/500ML-% IV SOLN
INTRAVENOUS | Status: DC | PRN
  Administered 2019-09-27 (×2): 500 mL

## 2019-09-27 MED ORDER — CHLORHEXIDINE GLUCONATE CLOTH 2 % EX PADS
6.0000 | MEDICATED_PAD | Freq: Every day | CUTANEOUS | Status: DC
  Administered 2019-09-27 – 2019-09-29 (×3): 6 via TOPICAL

## 2019-09-27 MED ORDER — ENOXAPARIN SODIUM 30 MG/0.3ML ~~LOC~~ SOLN: 30.0000 mg | SUBCUTANEOUS | Status: DC

## 2019-09-27 MED ORDER — NITROGLYCERIN IN D5W 200-5 MCG/ML-% IV SOLN
INTRAVENOUS | Status: AC
  Filled 2019-09-27: qty 250

## 2019-09-27 MED ORDER — POTASSIUM CHLORIDE CRYS ER 20 MEQ PO TBCR
20.0000 meq | EXTENDED_RELEASE_TABLET | Freq: Two times a day (BID) | ORAL | Status: DC
  Administered 2019-09-27 – 2019-09-29 (×6): 20 meq via ORAL
  Filled 2019-09-27 (×5): qty 1

## 2019-09-27 MED ORDER — LIDOCAINE HCL (PF) 1 % IJ SOLN
INTRAMUSCULAR | Status: AC
  Filled 2019-09-27: qty 30

## 2019-09-27 MED ORDER — ONDANSETRON HCL 4 MG/2ML IJ SOLN
4.0000 mg | Freq: Four times a day (QID) | INTRAMUSCULAR | Status: DC | PRN
  Administered 2019-09-27: 4 mg via INTRAVENOUS

## 2019-09-27 MED ORDER — SODIUM CHLORIDE 0.9 % IV SOLN: 250.0000 mL | INTRAVENOUS | Status: DC | PRN

## 2019-09-27 MED ORDER — SACUBITRIL-VALSARTAN 24-26 MG PO TABS
1.0000 | ORAL_TABLET | Freq: Two times a day (BID) | ORAL | Status: DC
  Administered 2019-09-27 – 2019-09-29 (×5): 1 via ORAL
  Filled 2019-09-27 (×6): qty 1

## 2019-09-27 MED ORDER — LIDOCAINE HCL (PF) 1 % IJ SOLN
INTRAMUSCULAR | Status: DC | PRN
  Administered 2019-09-27 (×2): 2 mL

## 2019-09-27 MED ORDER — INSULIN ASPART 100 UNIT/ML ~~LOC~~ SOLN: 0.0000 [IU] | Freq: Every day | SUBCUTANEOUS | Status: DC

## 2019-09-27 MED ORDER — ATORVASTATIN CALCIUM 40 MG PO TABS
40.0000 mg | ORAL_TABLET | ORAL | Status: DC
  Administered 2019-09-28: 40 mg via ORAL
  Filled 2019-09-27 (×3): qty 1

## 2019-09-27 MED ORDER — MAGNESIUM SULFATE 2 GM/50ML IV SOLN
2.0000 g | Freq: Once | INTRAVENOUS | Status: AC
  Administered 2019-09-27: 2 g via INTRAVENOUS
  Filled 2019-09-27: qty 50

## 2019-09-27 MED ORDER — FENTANYL CITRATE (PF) 100 MCG/2ML IJ SOLN
INTRAMUSCULAR | Status: DC | PRN
  Administered 2019-09-27: 25 ug via INTRAVENOUS

## 2019-09-27 MED ORDER — HEPARIN SODIUM (PORCINE) 1000 UNIT/ML IJ SOLN
INTRAMUSCULAR | Status: AC
  Filled 2019-09-27: qty 1

## 2019-09-27 MED ORDER — SODIUM CHLORIDE 0.9% FLUSH: 3.0000 mL | INTRAVENOUS | Status: DC | PRN

## 2019-09-27 MED ORDER — MIDAZOLAM HCL 2 MG/2ML IJ SOLN
INTRAMUSCULAR | Status: DC | PRN
  Administered 2019-09-27: 1 mg via INTRAVENOUS

## 2019-09-27 MED ORDER — FUROSEMIDE 10 MG/ML IJ SOLN
INTRAMUSCULAR | Status: DC | PRN
  Administered 2019-09-27: 80 mg via INTRAVENOUS

## 2019-09-27 MED ORDER — SODIUM CHLORIDE 0.9% FLUSH
10.0000 mL | INTRAVENOUS | Status: DC | PRN
  Administered 2019-09-28: 10 mL

## 2019-09-27 MED ORDER — INSULIN ASPART 100 UNIT/ML ~~LOC~~ SOLN
0.0000 [IU] | Freq: Three times a day (TID) | SUBCUTANEOUS | Status: DC
  Administered 2019-09-27: 1 [IU] via SUBCUTANEOUS
  Administered 2019-09-28: 3 [IU] via SUBCUTANEOUS
  Administered 2019-09-28: 2 [IU] via SUBCUTANEOUS
  Administered 2019-09-28 – 2019-09-29 (×3): 1 [IU] via SUBCUTANEOUS

## 2019-09-27 MED ORDER — NITROGLYCERIN IN D5W 200-5 MCG/ML-% IV SOLN
INTRAVENOUS | Status: AC | PRN
  Administered 2019-09-27: 10 ug/min via INTRAVENOUS

## 2019-09-27 MED ORDER — VALACYCLOVIR HCL 500 MG PO TABS
500.0000 mg | ORAL_TABLET | Freq: Every day | ORAL | Status: DC
  Administered 2019-09-27 – 2019-09-29 (×3): 500 mg via ORAL
  Filled 2019-09-27 (×3): qty 1

## 2019-09-27 MED ORDER — SODIUM CHLORIDE 0.9 % WEIGHT BASED INFUSION: 3.0000 mL/kg/h | INTRAVENOUS | Status: DC

## 2019-09-27 MED ORDER — IOHEXOL 350 MG/ML SOLN
INTRAVENOUS | Status: DC | PRN
  Administered 2019-09-27: 65 mL

## 2019-09-27 MED ORDER — FUROSEMIDE 10 MG/ML IJ SOLN
INTRAMUSCULAR | Status: AC
  Filled 2019-09-27: qty 8

## 2019-09-27 MED ORDER — POLYSACCHARIDE IRON COMPLEX 150 MG PO CAPS
150.0000 mg | ORAL_CAPSULE | Freq: Two times a day (BID) | ORAL | Status: DC
  Administered 2019-09-27 – 2019-09-29 (×5): 150 mg via ORAL
  Filled 2019-09-27 (×6): qty 1

## 2019-09-27 MED ORDER — ACETAMINOPHEN 325 MG PO TABS
650.0000 mg | ORAL_TABLET | ORAL | Status: DC | PRN
  Administered 2019-09-27 – 2019-09-28 (×3): 650 mg via ORAL
  Filled 2019-09-27 (×3): qty 2

## 2019-09-27 MED ORDER — SPIRONOLACTONE 12.5 MG HALF TABLET
12.5000 mg | ORAL_TABLET | Freq: Every day | ORAL | Status: DC
  Administered 2019-09-27 – 2019-09-29 (×3): 12.5 mg via ORAL
  Filled 2019-09-27 (×3): qty 1

## 2019-09-27 MED ORDER — SODIUM CHLORIDE 0.9 % WEIGHT BASED INFUSION: 1.0000 mL/kg/h | INTRAVENOUS | Status: DC

## 2019-09-27 MED ORDER — UMECLIDINIUM BROMIDE 62.5 MCG/INH IN AEPB
1.0000 | INHALATION_SPRAY | Freq: Every day | RESPIRATORY_TRACT | Status: DC
  Administered 2019-09-27 – 2019-09-28 (×2): 1 via RESPIRATORY_TRACT
  Filled 2019-09-27 (×2): qty 7

## 2019-09-27 MED ORDER — SUCRALFATE 1 G PO TABS
1.0000 g | ORAL_TABLET | Freq: Four times a day (QID) | ORAL | Status: DC
  Administered 2019-09-27 (×2): 1 g via ORAL
  Filled 2019-09-27 (×10): qty 1

## 2019-09-27 MED ORDER — ENOXAPARIN SODIUM 40 MG/0.4ML ~~LOC~~ SOLN
40.0000 mg | SUBCUTANEOUS | Status: DC
  Administered 2019-09-28 – 2019-09-29 (×2): 40 mg via SUBCUTANEOUS
  Filled 2019-09-27 (×2): qty 0.4

## 2019-09-27 MED ORDER — MOMETASONE FURO-FORMOTEROL FUM 200-5 MCG/ACT IN AERO
2.0000 | INHALATION_SPRAY | Freq: Two times a day (BID) | RESPIRATORY_TRACT | Status: DC
  Administered 2019-09-27 – 2019-09-28 (×4): 2 via RESPIRATORY_TRACT
  Filled 2019-09-27 (×2): qty 8.8

## 2019-09-27 MED ORDER — DULOXETINE HCL 60 MG PO CPEP
60.0000 mg | ORAL_CAPSULE | Freq: Two times a day (BID) | ORAL | Status: DC
  Administered 2019-09-27 – 2019-09-29 (×5): 60 mg via ORAL
  Filled 2019-09-27 (×5): qty 1

## 2019-09-27 MED ORDER — VERAPAMIL HCL 2.5 MG/ML IV SOLN
INTRAVENOUS | Status: AC
  Filled 2019-09-27: qty 2

## 2019-09-27 MED ORDER — SODIUM CHLORIDE 0.9% FLUSH
10.0000 mL | Freq: Two times a day (BID) | INTRAVENOUS | Status: DC
  Administered 2019-09-27: 30 mL
  Administered 2019-09-27: 20 mL
  Administered 2019-09-28 (×2): 10 mL

## 2019-09-27 MED ORDER — ALBUTEROL SULFATE (2.5 MG/3ML) 0.083% IN NEBU: 2.5000 mg | INHALATION_SOLUTION | RESPIRATORY_TRACT | Status: DC | PRN

## 2019-09-27 SURGICAL SUPPLY — 14 items
CATH 5FR JL3.5 JR4 ANG PIG MP (CATHETERS) ×2 IMPLANT
CATH BALLN WEDGE 5F 110CM (CATHETERS) ×2 IMPLANT
CATH LAUNCHER 5F RADR (CATHETERS) ×1 IMPLANT
CATHETER LAUNCHER 5F RADR (CATHETERS) ×2
DEVICE RAD COMP TR BAND LRG (VASCULAR PRODUCTS) ×2 IMPLANT
GLIDESHEATH SLEND SS 6F .021 (SHEATH) ×2 IMPLANT
GUIDEWIRE INQWIRE 1.5J.035X260 (WIRE) ×1 IMPLANT
INQWIRE 1.5J .035X260CM (WIRE) ×2
KIT HEART LEFT (KITS) ×2 IMPLANT
PACK CARDIAC CATHETERIZATION (CUSTOM PROCEDURE TRAY) ×2 IMPLANT
SHEATH GLIDE SLENDER 4/5FR (SHEATH) ×2 IMPLANT
SHEATH PROBE COVER 6X72 (BAG) ×2 IMPLANT
TRANSDUCER W/STOPCOCK (MISCELLANEOUS) ×2 IMPLANT
TUBING CIL FLEX 10 FLL-RA (TUBING) ×2 IMPLANT

## 2019-09-27 NOTE — Progress Notes (Signed)
IV Team return call and will meet patient in Room [phone]

## 2019-09-27 NOTE — Consult Note (Addendum)
Advanced Heart Failure Team Consult Note   Primary Physician: Maury Dus, MD PCP-Cardiologist:  Elouise Munroe, MD  Reason for Consultation: Acute Systolic HF   HPI:    Alexandria Ruiz is seen today for evaluation of acute systolic heart failure at the request of Dr [place].   Alexandria Ruiz is a 64 year old with history of asthma, HTN, HLD, DMII, OA,  RTKR& LTKR, OSA and recently noted to have reduced EF on myoview and ECHO.   Recently evaluated by Dr Margaretann Loveless for dyspnea. Had myoview and ECHO that showed reduced EF. At that point she was set up cath. Prior to admit she was taking 60 mg lasix daily.   Today she presented for diagnostic cath that showed normal cors, elevated filling pressures, and low put. Hypertensive in the cath lab so Nitro drip was started. Remains on NTG at 25 mcg.   Denies chest pain.   RHC/LHC  Normal Cors RA 33 PA 70/55 mean 55 [place]api 0.78  PWCP 46 CO 3.3  CI 1.33   Echo 08/2019 35-40%   Review of Systems: [y] = yes, [ ]  = no   . General: Weight gain [ ] ; Weight loss [ ] ; Anorexia [ ] ; Fatigue [ Y]; Fever [ ] ; Chills [ ] ; Weakness [ ]   . Cardiac: Chest pain/pressure [ ] ; Resting SOB [ ] ; Exertional SOB [ Y]; Orthopnea [Y ]; Pedal Edema [ ] ; Palpitations [ ] ; Syncope [ ] ; Presyncope [ ] ; Paroxysmal nocturnal dyspnea[ ]   . Pulmonary: Cough [ ] ; Wheezing[ ] ; Hemoptysis[ ] ; Sputum [ ] ; Snoring [ ]   . GI: Vomiting[ ] ; Dysphagia[ ] ; Melena[ ] ; Hematochezia [ ] ; Heartburn[ ] ; Abdominal pain [ ] ; Constipation [ ] ; Diarrhea [ ] ; BRBPR [ ]   . GU: Hematuria[ ] ; Dysuria [ ] ; Nocturia[ ]   . Vascular: Pain in legs with walking [ ] ; Pain in feet with lying flat [ ] ; Non-healing sores [ ] ; Stroke [ ] ; TIA [ ] ; Slurred speech [ ] ;  . Neuro: Headaches[ ] ; Vertigo[ ] ; Seizures[ ] ; Paresthesias[ ] ;Blurred vision [ ] ; Diplopia [ ] ; Vision changes [ ]   . Ortho/Skin: Arthritis [ ] ; Joint pain [Y ]; Muscle pain [ ] ; Joint swelling [ ] ; Back Pain [Y ]; Rash [ ]    . Psych: Depression[ ] ; Anxiety[ ]   . Heme: Bleeding problems [ ] ; Clotting disorders [ ] ; Anemia [ ]   . Endocrine: Diabetes [Y ]; Thyroid dysfunction[ ]   Home Medications Prior to Admission medications   Medication Sig Start Date End Date Taking? Authorizing Provider  ALPRAZolam Duanne Moron) 0.5 MG tablet Take 0.25 mg by mouth daily as needed for anxiety.  04/22/19  Yes [provider]  atorvastatin (LIPITOR) 40 MG tablet Take 40 mg by mouth every Monday, Wednesday, and Friday.    Yes [provider]  budesonide-formoterol (SYMBICORT) 160-4.5 MCG/ACT inhaler Inhale 2 puffs into the lungs 2 (two) times daily. 03/15/19  Yes [provider]  DULoxetine (CYMBALTA) 60 MG capsule Take 60 mg by mouth 2 (two) times daily. 03/22/19  Yes [provider]  estradiol (ESTRACE) 1 MG tablet Take 1 mg by mouth at bedtime. 09/21/19  Yes [provider]  FERREX 150 150 MG capsule Take 150 mg by mouth 2 (two) times daily. 07/04/17  Yes [provider]  furosemide (LASIX) 20 MG tablet Take 1 tablet (20 mg total) by mouth daily. Patient taking differently: Take 20 mg by mouth daily in the afternoon.  05/24/19  Yes  Fredia Sorrow, MD  furosemide (LASIX) 40 MG tablet Take 40 mg by mouth daily in the afternoon. 07/27/19  Yes [provider]  metoprolol succinate (TOPROL XL) 25 MG 24 hr tablet Take 1 tablet (25 mg total) by mouth daily. 08/24/19  Yes Elouise Munroe, MD  potassium chloride (KLOR-CON) 10 MEQ tablet Take 1 tablet (10 mEq total) by mouth daily. Patient taking differently: Take 20 mEq by mouth daily.  05/24/19  Yes Fredia Sorrow, MD  SPIRIVA RESPIMAT 1.25 MCG/ACT AERS Take 2 puffs by mouth daily.  03/10/16  Yes [provider]  sucralfate (CARAFATE) 1 g tablet Take 1 g by mouth 4 (four) times daily. 05/07/19  Yes [provider]  TRULICITY 1.5 FW/2.6VZ SOPN Inject 1.5 mg into the skin every Saturday.  06/26/17  Yes [provider]  valACYclovir (VALTREX) 500 MG tablet Take 500 mg by mouth daily. 07/30/19  Yes [provider]  albuterol (PROVENTIL HFA;VENTOLIN HFA) 108 (90 BASE) MCG/ACT inhaler Inhale 2 puffs into the lungs every 6 (six) hours as needed for wheezing.    [provider]  albuterol (PROVENTIL) (2.5 MG/3ML) 0.083% nebulizer solution Take 2.5 mg by nebulization every 4 (four) hours as needed for wheezing or shortness of breath.  04/20/19   [provider]    Past Medical History: Past Medical History:  Diagnosis Date  . Abdominal pain   . Allergic rhinitis   . Anemia   . Anxiety   . Arthritis   . Asthma   . Bronchitis   . Diabetes mellitus    type 2  . GERD (gastroesophageal reflux disease)   . Hyperlipidemia   . Hypertension   . Obesity   . Peripheral neuropathy    denies has carpal tunnel  . PONV (postoperative nausea and vomiting)   . Shortness of breath dyspnea    since starting Trulicity diabetic med  . Sleep apnea    uses c-pap machine    Past Surgical History: Past Surgical History:  Procedure Laterality Date  . ABDOMINAL HYSTERECTOMY    . BREAST REDUCTION SURGERY    . HAMMER TOE SURGERY     bilateral feet  . JOINT REPLACEMENT     R TKA Dr. Wynelle Link 07-25-17  . NOSE SURGERY     sinus surgery  . ORIF ANKLE FRACTURE  06/29/2011   Procedure: OPEN REDUCTION INTERNAL FIXATION (ORIF) ANKLE FRACTURE;  Surgeon: Sharmon Revere, MD;  Location: [facility];  Service: Orthopedics;  Laterality: Right;  . right rotator cuff surgery - 11-2013    . STERIOD INJECTION Right 06/23/2015   Procedure: RIGHT KNEE CORTISONE INJECTION;  Surgeon: Gaynelle Arabian, MD;  Location: [facility];  Service: Orthopedics;  Laterality: Right;  . TOTAL KNEE ARTHROPLASTY Left 06/23/2015   Procedure: TOTAL LEFT KNEE ARTHROPLASTY;  Surgeon: Gaynelle Arabian, MD;  Location: [facility];  Service: Orthopedics;  Laterality: Left;  . TOTAL KNEE ARTHROPLASTY Right 07/25/2017   Procedure: RIGHT TOTAL KNEE  ARTHROPLASTY;  Surgeon: Gaynelle Arabian, MD;  Location: [facility];  Service: Orthopedics;  Laterality: Right;  Adductor Block    Family History: Family History  Adopted: Yes    Social History: Social History   Socioeconomic History  . Marital status: Married    Spouse name: Not on file  . Number of children: Not on file  . Years of education: Not on file  . Highest education level: Not on file  Occupational History  . Not on file  Tobacco Use  .  Smoking status: Former Smoker    Packs/day: 0.50    Years: 30.00    Pack years: 15.00    Quit date: 10/29/2006    Years since quitting: 12.9  . Smokeless tobacco: Never Used  Vaping Use  . Vaping Use: Never used  Substance and Sexual Activity  . Alcohol use: No  . Drug use: No  . Sexual activity: Not Currently  Other Topics Concern  . Not on file  Social History Narrative  . Not on file   Social Determinants of Health   Financial Resource Strain:   . Difficulty of Paying Living Expenses:   Food Insecurity:   . Worried About Charity fundraiser in the Last Year:   . Arboriculturist in the Last Year:   Transportation Needs:   . Film/video editor (Medical):   Marland Kitchen Lack of Transportation (Non-Medical):   Physical Activity:   . Days of Exercise per Week:   . Minutes of Exercise per Session:   Stress:   . Feeling of Stress :   Social Connections:   . Frequency of Communication with Friends and Family:   . Frequency of Social Gatherings with Friends and Family:   . Attends Religious Services:   . Active Member of Clubs or Organizations:   . Attends Archivist Meetings:   Marland Kitchen Marital Status:     Allergies:  Allergies  Allergen Reactions  . Hydrocodone Itching  . Oxycodone Itching    Objective:    Vital Signs:   Temp:  [97.9 F (36.6 C)] 97.9 F (36.6 C) (07/15 0619) Pulse Rate:  [86-112] 91 (07/15 0821) Resp:  [0-46] 37 (07/15 0821) BP: (160-190)/(106-134) 168/127 (07/15 0821) SpO2:  [85 %-99 %] 96 %  (07/15 0821) Weight:  [131.8 kg] 131.8 kg (07/15 0619)    Weight change: Filed Weights   09/27/19 0619  Weight: 131.8 kg    Intake/Output:  No intake or output data in the 24 hours ending 09/27/19 0844    Physical Exam    General:   No resp difficulty HEENT: normal Neck: supple. JVP difficult to assess due to body habitus. Carotids 2+ bilat; no bruits. No lymphadenopathy or thyromegaly appreciated. Cor: PMI nondisplaced. Regular rate & rhythm. No rubs, gallops or murmurs. Lungs: clear Abdomen: soft, nontender, nondistended. No hepatosplenomegaly. No bruits or masses. Good bowel sounds. Extremities: warm. no cyanosis, clubbing, rash, edema Neuro: alert & orientedx3, cranial nerves grossly intact. moves all 4 extremities w/o difficulty. Affect pleasant   Telemetry   NSR 80s   EKG    EKG pending.   Labs   Basic Metabolic Panel: No results for input(s): NA, K, CL, CO2, GLUCOSE, BUN, CREATININE, CALCIUM, MG, PHOS in the last 168 hours.  Liver Function Tests: No results for input(s): AST, ALT, ALKPHOS, BILITOT, PROT, ALBUMIN in the last 168 hours. No results for input(s): LIPASE, AMYLASE in the last 168 hours. No results for input(s): AMMONIA in the last 168 hours.  CBC: No results for input(s): WBC, NEUTROABS, HGB, HCT, MCV, PLT in the last 168 hours.  Cardiac Enzymes: No results for input(s): CKTOTAL, CKMB, CKMBINDEX, TROPONINI in the last 168 hours.  BNP: BNP (last 3 results) Recent Labs    05/23/19 1817  BNP 713.2*    ProBNP (last 3 results) No results for input(s): PROBNP in the last 8760 hours.   CBG: Recent Labs  Lab 09/27/19 0621  GLUCAP 147*    Coagulation Studies: No results for input(s): LABPROT,  INR in the last 72 hours.   Imaging   CARDIAC CATHETERIZATION  Result Date: 2/83/6629  LV end diastolic pressure is severely elevated.  Hemodynamic findings consistent with severe pulmonary hypertension.  1. Normal coronary anatomy 2.  Severely elevated LV filling pressures 3. Severe pulmonary venous hypertension 4. Severely depressed cardiac output with index 1.3 Plan: will admit. Patient is significantly SOB during the procedure. She is hypertensive. Will start IV Ntg and IV lasix. Will consult Advanced heart failure service.   [place] EKG SITE RITE  Result Date: 09/27/2019 If Site Rite image not attached, placement could not be confirmed due to current cardiac rhythm.     Medications:     Current Medications: . furosemide  80 mg Intravenous BID     Infusions: . sodium chloride 0.072 mL/kg/hr (09/27/19 0829)       Assessment/Plan   1. A/C Systolic HF  Had cath today with normal cors, elevated filling pressures, and  (cardiac index 1.3) low output.  NICM suspect due to hypertension. Check TSH/HIV. Check EKG  - Placed PICC to guide diuresis and follow CVP/CO-OX   - Marked volume overload. Received IV lasix in the cath lab. Would like to give  IV lasix 80 mg twice a day.  - Add milrinone 0.25 mcg and will start to wean NTG as her pressures come down.  - No bb with low output. Down the road would consider bisoprolol with chronic lung issues.  - Anticipate adding entresto + spiro.   2. Uncontrolled HTN Continue NTG and wean after milrinone started.   3. OSA On CPAP at home  - Will order for nightlly use.  4. DMII Check HGB A1C - Use sliding scale.  - May be able to consider SGT2i   5. Obesity  Body mass index is 40.52 kg/m.   6. H/O Asthma  Continue home inhalers.    Admit to [facility].    Length of Stay: 0  Darrick Grinder, NP  09/27/2019, 8:44 AM  Advanced Heart Failure Team Pager [phone] (M-F; 7a - 4p)  Please contact [facility] for night-coverage after hours (4p -7a ) and weekends on [email or web address]  Patient seen with NP, agree with the above note.    Patient reports dyspnea x "years."  Recent echo showed EF 35-40%, and with ongoing dyspnea, she was sent for RHC/LHC. This was done today and showed  no significant coronary disease but markedly elevated right and left heart filling pressures and low cardiac index (1.3).  Patient was admitted for diuresis and management of low output.  She was started on NTG gtt + milrinone. Co-ox improved from 33% => 73% now.  She got 80 mg IV Lasix and diuresed vigorously, CVP down to 6 (RA pressure 35 mmHg on cath).    General: NAD Neck: JVP 10 cm, no thyromegaly or thyroid nodule.  Lungs: Clear to auscultation bilaterally with normal respiratory effort. CV: Nondisplaced PMI.  Heart regular S1/S2, no S3/S4, no murmur. 1+ ankle edema.  Abdomen: Soft, nontender, no hepatosplenomegaly, no distention.  Skin: Intact without lesions or rashes.  Neurologic: Alert and oriented x 3.  Psych: Normal affect. Extremities: No clubbing or cyanosis.  HEENT: Normal.   1. Acute on chronic systolic CHF: Nonischemic cardiomyopathy.  Echo (6/21) showed EF 35-40%, cath today with markedly elevated filling pressures and low CI 1.3.  Possible hypertensive cardiomyopathy.  She is markedly better currently on milrinone 0.25 and NTG gtt with 1 dose of Lasix 80 mg IV x 1.  Co-ox up to 73% with CVP now 6.   I suspect she had flash pulmonary edema in the cath lab hypertensive emergency (no BP meds this morning).  - Continue milrinone 0.25 mcg/kg/min for now, likely can stop soon.  - Add Entresto 24/26 bid + spironolactone 12.5 daily.  - Titrate down on NTG gtt as needed.  - 1 more dose of Lasix 40 mg IV this evening then stop and reassess, may be ready for po tomorrow if CVP remains low.  2. HTN: Long-standing, suspect hypertensive emergency this morning with flash pulmonary edema.  As above, weaning off NTG gtt and onto Entresto and spironolactone.  3. OSA: Continue CPAP.   Loralie Champagne 09/27/2019 2:21 PM

## 2019-09-27 NOTE — Research (Signed)
[place] Informed Consent   Subject Name: Alexandria Ruiz  Subject met inclusion and exclusion criteria.  The informed consent form, study requirements and expectations were reviewed with the subject and questions and concerns were addressed prior to the signing of the consent form.  The subject verbalized understanding of the trial requirements.  The subject agreed to participate in the [facility] trial and signed the informed consent at 0640 on 09/27/19.  The informed consent was obtained prior to performance of any protocol-specific procedures for the subject.  A copy of the signed informed consent was given to the subject and a copy was placed in the subject's medical record.   Faron Whitelock

## 2019-09-27 NOTE — Progress Notes (Signed)
Peripherally Inserted Central Catheter Placement  The IV Nurse has discussed with the patient and/or persons authorized to consent for the patient, the purpose of this procedure and the potential benefits and risks involved with this procedure.  The benefits include less needle sticks, lab draws from the catheter, and the patient may be discharged home with the catheter. Risks include, but not limited to, infection, bleeding, blood clot (thrombus formation), and puncture of an artery; nerve damage and irregular heartbeat and possibility to perform a PICC exchange if needed/ordered by physician.  Alternatives to this procedure were also discussed.  Bard Power PICC patient education guide, fact sheet on infection prevention and patient information card has been provided to patient /or left at bedside.    PICC Placement Documentation  PICC Triple Lumen 09/27/19 PICC Right Cephalic 40 cm 0 cm (Active)  Indication for Insertion or Continuance of Line Vasoactive infusions 09/27/19 1100  Exposed Catheter (cm) 0 cm 09/27/19 1100  Site Assessment Clean;Dry;Intact 09/27/19 1100  Lumen #1 Status Flushed;Saline locked;Blood return noted 09/27/19 1100  Lumen #2 Status Flushed;Saline locked;Blood return noted 09/27/19 1100  Lumen #3 Status Flushed;Saline locked;Blood return noted 09/27/19 1100  Dressing Type Transparent;Securing device 09/27/19 1100  Dressing Status Clean;Dry;Intact;Antimicrobial disc in place 09/27/19 1100  Safety Lock Not Applicable 60/15/61 5379  Line Care Connections checked and tightened 09/27/19 1100  Dressing Intervention New dressing;Other (Comment) 09/27/19 1100  Dressing Change Due 10/04/19 09/27/19 1100       Alexandria Ruiz 09/27/2019, 11:46 AM

## 2019-09-27 NOTE — Interval H&P Note (Signed)
History and Physical Interval Note:  09/27/2019 7:27 AM  Alexandria Ruiz  has presented today for surgery, with the diagnosis of heart failure - shortness of breath.  The various methods of treatment have been discussed with the patient and family. After consideration of risks, benefits and other options for treatment, the patient has consented to  Procedure(s): RIGHT/LEFT HEART CATH AND CORONARY ANGIOGRAPHY (N/A) as a surgical intervention.  The patient's history has been reviewed, patient examined, no change in status, stable for surgery.  I have reviewed the patient's chart and labs.  Questions were answered to the patient's satisfaction.   Cath Lab Visit (complete for each Cath Lab visit)  Clinical Evaluation Leading to the Procedure:   ACS: No.  Non-ACS:    Anginal Classification: CCS III  Anti-ischemic medical therapy: Maximal Therapy (2 or more classes of medications)  Non-Invasive Test Results: Low-risk stress test findings: cardiac mortality <1%/year  Prior CABG: No previous CABG        Collier Salina [facility] 09/27/2019 7:27 AM

## 2019-09-27 NOTE — Progress Notes (Signed)
Unable to obtain second IV Cath lab called and informed states they will start Iv there

## 2019-09-28 LAB — CBC
HCT: 36.2 % (ref 36.0–46.0)
Hemoglobin: 11.1 g/dL — ABNORMAL LOW (ref 12.0–15.0)
MCH: 28.5 pg (ref 26.0–34.0)
MCHC: 30.7 g/dL (ref 30.0–36.0)
MCV: 93.1 fL (ref 80.0–100.0)
Platelets: 256 10*3/uL (ref 150–400)
RBC: 3.89 MIL/uL (ref 3.87–5.11)
RDW: 15.8 % — ABNORMAL HIGH (ref 11.5–15.5)
WBC: 8.1 10*3/uL (ref 4.0–10.5)
nRBC: 0 % (ref 0.0–0.2)

## 2019-09-28 LAB — BASIC METABOLIC PANEL
Anion gap: 11 (ref 5–15)
Anion gap: 12 (ref 5–15)
BUN: 13 mg/dL (ref 8–23)
BUN: 15 mg/dL (ref 8–23)
CO2: 23 mmol/L (ref 22–32)
CO2: 23 mmol/L (ref 22–32)
Calcium: 8.8 mg/dL — ABNORMAL LOW (ref 8.9–10.3)
Calcium: 8.9 mg/dL (ref 8.9–10.3)
Chloride: 104 mmol/L (ref 98–111)
Chloride: 104 mmol/L (ref 98–111)
Creatinine, Ser: 1.12 mg/dL — ABNORMAL HIGH (ref 0.44–1.00)
Creatinine, Ser: 1.15 mg/dL — ABNORMAL HIGH (ref 0.44–1.00)
GFR calc Af Amer: 59 mL/min — ABNORMAL LOW (ref >60)
GFR calc Af Amer: >60 mL/min (ref >60)
GFR calc non Af Amer: 51 mL/min — ABNORMAL LOW (ref >60)
GFR calc non Af Amer: 53 mL/min — ABNORMAL LOW (ref >60)
Glucose, Bld: 156 mg/dL — ABNORMAL HIGH (ref 70–99)
Glucose, Bld: 197 mg/dL — ABNORMAL HIGH (ref 70–99)
Potassium: 3.7 mmol/L (ref 3.5–5.1)
Potassium: 4 mmol/L (ref 3.5–5.1)
Sodium: 138 mmol/L (ref 135–145)
Sodium: 139 mmol/L (ref 135–145)

## 2019-09-28 LAB — MAGNESIUM: Magnesium: 2.1 mg/dL (ref 1.7–2.4)

## 2019-09-28 LAB — COOXEMETRY PANEL
Carboxyhemoglobin: 2.1 % — ABNORMAL HIGH (ref 0.5–1.5)
Carboxyhemoglobin: 1.7 % — ABNORMAL HIGH (ref 0.5–1.5)
Methemoglobin: 1.1 % (ref 0.0–1.5)
Methemoglobin: 0.5 % (ref 0.0–1.5)
O2 Saturation: 72 %
O2 Saturation: 72.1 %
Total hemoglobin: 11.8 g/dL — ABNORMAL LOW (ref 12.0–16.0)
Total hemoglobin: 11.2 g/dL — ABNORMAL LOW (ref 12.0–16.0)

## 2019-09-28 LAB — GLUCOSE, CAPILLARY
Glucose-Capillary: 207 mg/dL — ABNORMAL HIGH (ref 70–99)
Glucose-Capillary: 155 mg/dL — ABNORMAL HIGH (ref 70–99)
Glucose-Capillary: 130 mg/dL — ABNORMAL HIGH (ref 70–99)
Glucose-Capillary: 146 mg/dL — ABNORMAL HIGH (ref 70–99)
Glucose-Capillary: 133 mg/dL — ABNORMAL HIGH (ref 70–99)

## 2019-09-28 MED ORDER — ORAL CARE MOUTH RINSE
15.0000 mL | Freq: Two times a day (BID) | OROMUCOSAL | Status: DC
  Administered 2019-09-28 – 2019-09-29 (×4): 15 mL via OROMUCOSAL

## 2019-09-28 MED ORDER — MILRINONE LACTATE IN DEXTROSE 20-5 MG/100ML-% IV SOLN: 0.1250 ug/kg/min | INTRAVENOUS | Status: DC

## 2019-09-28 MED ORDER — NOREPINEPHRINE 4 MG/250ML-% IV SOLN
INTRAVENOUS | Status: AC
  Filled 2019-09-28: qty 250

## 2019-09-28 MED ORDER — DIGOXIN 125 MCG PO TABS: 0.1250 mg | ORAL_TABLET | Freq: Every day | ORAL | Status: DC

## 2019-09-28 NOTE — Progress Notes (Signed)
CARDIAC REHAB PHASE I   PRE:  Rate/Rhythm: 94 SR  BP:  Supine:   Sitting: 107/70  Standing:    SaO2: 97% 2L   MODE:  Ambulation: 160 ft   POST:  Rate/Rhythm: 123 ST  95 with rest  BP:  Supine:   Sitting: 103/81  Standing:    SaO2: 100% 2L 1325-1440 Pt walked 160 ft on 2L with rolling walker and asst x 1 due to equipment. C/o legs bothering her more than SOB. Pt has not been able to walk far for some time. Mainly in house. To BSC and to bed after walk. Had been up most of day per pt. Gave pt CHF booklet and reviewed zones and when to call MD. Discussed importance of daily weights. Gave low sodium diets and discussed 2000 mg sodium restriction. Pt voiced understanding. Took pt a few minutes to recover breath after walking but 100% on 2L.   Graylon Good, RN BSN  09/28/2019 2:39 PM

## 2019-09-28 NOTE — Progress Notes (Addendum)
Advanced Heart Failure Rounding Note  PCP-Cardiologist: Elouise Munroe, MD   Subjective:    Post cath - diuresed with IV lasix, placed on NTG drip and started on milrinone. NTG weaned off. Remains on milrinone 0.25 mcg.   Brisk diuresis noted. Negative 5.3 liters.   Denies SOB.   Objective:   Weight Range: 134.4 kg Body mass index is 41.34 kg/m.   Vital Signs:   Temp:  [97 F (36.1 C)-98.9 F (37.2 C)] 98.5 F (36.9 C) (07/16 0658) Pulse Rate:  [77-116] 91 (07/16 0700) Resp:  [0-46] 26 (07/16 0700) BP: (83-205)/(48-134) 100/63 (07/16 0700) SpO2:  [85 %-99 %] 94 % (07/16 0700) Weight:  [134.4 kg] 134.4 kg (07/16 0500) Last BM Date: 09/27/19  Weight change: Filed Weights   09/27/19 0619 09/28/19 0500  Weight: 131.8 kg 134.4 kg    Intake/Output:   Intake/Output Summary (Last 24 hours) at 09/28/2019 0714 Last data filed at 09/28/2019 0700 Gross per 24 hour  Intake 1148.9 ml  Output 6450 ml  Net -5301.1 ml      Physical Exam   CVP 2-3  General:  Well appearing. No resp difficulty HEENT: Normal Neck: Supple. JVP flat  . Carotids 2+ bilat; no bruits. No lymphadenopathy or thyromegaly appreciated. Cor: PMI nondisplaced. Regular rate & rhythm. No rubs, gallops or murmurs. Lungs: Clear Abdomen: Soft, nontender, nondistended. No hepatosplenomegaly. No bruits or masses. Good bowel sounds. Extremities: No cyanosis, clubbing, rash, edema. RUE PICC Neuro: Alert & orientedx3, cranial nerves grossly intact. moves all 4 extremities w/o difficulty. Affect pleasant   Telemetry   NSR PVCs 90s   EKG   n/a  Labs    CBC Recent Labs    09/27/19 1255 09/28/19 0517  WBC 7.1 8.1  HGB 11.6* 11.1*  HCT 37.5 36.2  MCV 92.4 93.1  PLT 262 009   Basic Metabolic Panel Recent Labs    09/27/19 1925 09/27/19 1925 09/28/19 0015 09/28/19 0517  NA 137   < > 138 139  K 3.7   < > 3.7 4.0  CL 105   < > 104 104  CO2 22   < > 23 23  GLUCOSE 174*   < > 156* 197*    BUN 15   < > 15 13  CREATININE 1.21*   < > 1.15* 1.12*  CALCIUM 8.8*   < > 8.8* 8.9  MG 2.3  --   --  2.1   < > = values in this interval not displayed.   Liver Function Tests No results for input(s): AST, ALT, ALKPHOS, BILITOT, PROT, ALBUMIN in the last 72 hours. No results for input(s): LIPASE, AMYLASE in the last 72 hours. Cardiac Enzymes No results for input(s): CKTOTAL, CKMB, CKMBINDEX, TROPONINI in the last 72 hours.  BNP: BNP (last 3 results) Recent Labs    05/23/19 1817  BNP 713.2*    ProBNP (last 3 results) No results for input(s): PROBNP in the last 8760 hours.   D-Dimer No results for input(s): DDIMER in the last 72 hours. Hemoglobin A1C Recent Labs    09/27/19 1230  HGBA1C 5.9*   Fasting Lipid Panel No results for input(s): CHOL, HDL, LDLCALC, TRIG, CHOLHDL, LDLDIRECT in the last 72 hours. Thyroid Function Tests Recent Labs    09/27/19 1230  TSH 1.215    Other results:   Imaging    CARDIAC CATHETERIZATION  Result Date: 3/81/8299  LV end diastolic pressure is severely elevated.  Hemodynamic findings consistent with severe  pulmonary hypertension.  1. Normal coronary anatomy 2. Severely elevated LV filling pressures 3. Severe pulmonary venous hypertension 4. Severely depressed cardiac output with index 1.3 Plan: will admit. Patient is significantly SOB during the procedure. She is hypertensive. Will start IV Ntg and IV lasix. Will consult Advanced heart failure service.   [place] EKG SITE RITE  Result Date: 09/27/2019 If Site Rite image not attached, placement could not be confirmed due to current cardiac rhythm.     Medications:     Scheduled Medications: . atorvastatin  40 mg Oral Q M,W,F  . Chlorhexidine Gluconate Cloth  6 each Topical Daily  . DULoxetine  60 mg Oral BID  . enoxaparin (LOVENOX) injection  40 mg Subcutaneous Q24H  . estradiol  1 mg Oral QHS  . insulin aspart  0-5 Units Subcutaneous QHS  . insulin aspart  0-9 Units  Subcutaneous TID WC  . iron polysaccharides  150 mg Oral BID  . mouth rinse  15 mL Mouth Rinse BID  . mometasone-formoterol  2 puff Inhalation BID  . potassium chloride  20 mEq Oral BID  . sacubitril-valsartan  1 tablet Oral BID  . sodium chloride flush  10-40 mL Intracatheter Q12H  . sodium chloride flush  3 mL Intravenous Q12H  . spironolactone  12.5 mg Oral Daily  . sucralfate  1 g Oral QID  . umeclidinium bromide  1 puff Inhalation Daily  . valACYclovir  500 mg Oral Daily     Infusions: . sodium chloride    . milrinone    . nitroGLYCERIN Stopped (09/27/19 1739)     PRN Medications:  sodium chloride, acetaminophen, albuterol, ondansetron (ZOFRAN) IV, sodium chloride flush, sodium chloride flush      Assessment/Plan   1. A/C Systolic HF  Had cath today with normal cors, elevated filling pressures, and  (cardiac index 1.3) low output.  NICM suspect due to hypertension. Check TSH/HIV. Check EKG  - PICC placed PICC to guide diuresis and follow CVP/CO-OX   -  Cut back to 0.125 mcg. CO-OX stable. Likely stop milrinone later today.  - CVP down to 2. Stop IV lasix  - No bb with low output. Down the road would consider bisoprolol with chronic lung issues.  - Continue current dose of entresto. Consult TOC for entresto.  - Continue current dose of spiro. - Add 0.125 mg digoxin daily  2. Uncontrolled HTN  - Better controlled. Need to be careful not to drop BP to much.   3. OSA On CPAP at home  - Will order for nightlly use.  4. DMII Check HGB A1C - Use sliding scale.  - May be able to consider SGT2i   5. Obesity  Body mass index is 41.34 kg/m. - Discussed portion control.  - Consult dietitian.   6. H/O Asthma  Continue home inhalers.   Consult cardiac rehab. Transfer to 2c.   Length of Stay: 1  Amy Clegg, NP  09/28/2019, 7:14 AM  Advanced Heart Failure Team Pager [phone] (M-F; [place])  Please contact [facility] for night-coverage after hours  (4p -7a ) and weekends on [email or web address]  Patient seen with NP, agree with the above note.   CVP down to 6 on my measure, co-ox 72%.  BP much lower, now off NTG gtt. Feels good, no complaints today.   General: NAD Neck: JVP 7 cm, no thyromegaly or thyroid nodule.  Lungs: Clear to auscultation bilaterally with normal respiratory effort. CV: Nondisplaced PMI.  Heart regular S1/S2,  no S3/S4, no murmur.  Trace ankle edema.  Abdomen: Soft, nontender, no hepatosplenomegaly, no distention.  Skin: Intact without lesions or rashes.  Neurologic: Alert and oriented x 3.  Psych: Normal affect. Extremities: No clubbing or cyanosis.  HEENT: Normal.   1. Acute on chronic systolic CHF: Nonischemic cardiomyopathy.  Echo (6/21) showed EF 35-40%, cath today with markedly elevated filling pressures and low CI 1.3.  Possible hypertensive cardiomyopathy.  She is markedly better currently on milrinone 0.25.  Co-ox 72% today with CVP 6 on my read.   I suspect she had flash pulmonary edema in the cath lab due to  hypertensive emergency.  - Decrease milrinone to 0.125 mcg/kg/min, repeat co-ox later this morning then stop if ok. Would hold off on digoxin for now.  - Continue Entresto 24/26 bid + spironolactone 12.5 daily.  - Will need po Lasix eventually.   2. HTN: Long-standing, suspect hypertensive emergency yesterday morning with flash pulmonary edema.  BP now much lower and off NTG gtt.  Watch for overshoot.  3. OSA: Continue CPAP.   She can go to telemetry today.   Loralie Champagne 09/28/2019 9:04 AM

## 2019-09-29 ENCOUNTER — Other Ambulatory Visit: Payer: Self-pay | Admitting: Physician Assistant

## 2019-09-29 DIAGNOSIS — I428 Other cardiomyopathies: Secondary | ICD-10-CM

## 2019-09-29 DIAGNOSIS — I5043 Acute on chronic combined systolic (congestive) and diastolic (congestive) heart failure: Secondary | ICD-10-CM

## 2019-09-29 DIAGNOSIS — I110 Hypertensive heart disease with heart failure: Secondary | ICD-10-CM

## 2019-09-29 LAB — BASIC METABOLIC PANEL
Anion gap: 8 (ref 5–15)
BUN: 12 mg/dL (ref 8–23)
CO2: 24 mmol/L (ref 22–32)
Calcium: 8.6 mg/dL — ABNORMAL LOW (ref 8.9–10.3)
Chloride: 107 mmol/L (ref 98–111)
Creatinine, Ser: 1.09 mg/dL — ABNORMAL HIGH (ref 0.44–1.00)
GFR calc Af Amer: >60 mL/min (ref >60)
GFR calc non Af Amer: 54 mL/min — ABNORMAL LOW (ref >60)
Glucose, Bld: 124 mg/dL — ABNORMAL HIGH (ref 70–99)
Potassium: 4 mmol/L (ref 3.5–5.1)
Sodium: 139 mmol/L (ref 135–145)

## 2019-09-29 LAB — COOXEMETRY PANEL
Carboxyhemoglobin: 1.4 % (ref 0.5–1.5)
Carboxyhemoglobin: 1.8 % — ABNORMAL HIGH (ref 0.5–1.5)
Methemoglobin: 0.5 % (ref 0.0–1.5)
Methemoglobin: 0.8 % (ref 0.0–1.5)
O2 Saturation: 52.3 %
O2 Saturation: 54.1 %
Total hemoglobin: 12.4 g/dL (ref 12.0–16.0)
Total hemoglobin: 12.7 g/dL (ref 12.0–16.0)

## 2019-09-29 LAB — GLUCOSE, CAPILLARY
Glucose-Capillary: 131 mg/dL — ABNORMAL HIGH (ref 70–99)
Glucose-Capillary: 130 mg/dL — ABNORMAL HIGH (ref 70–99)
Glucose-Capillary: 123 mg/dL — ABNORMAL HIGH (ref 70–99)

## 2019-09-29 MED ORDER — POTASSIUM CHLORIDE CRYS ER 20 MEQ PO TBCR: 20.0000 meq | EXTENDED_RELEASE_TABLET | Freq: Every day | ORAL | 11 refills | Status: DC

## 2019-09-29 MED ORDER — CARVEDILOL 3.125 MG PO TABS: 3.1250 mg | ORAL_TABLET | Freq: Two times a day (BID) | ORAL | 11 refills | Status: DC

## 2019-09-29 MED ORDER — FUROSEMIDE 40 MG PO TABS: 40.0000 mg | ORAL_TABLET | Freq: Two times a day (BID) | ORAL | Status: DC

## 2019-09-29 MED ORDER — SACUBITRIL-VALSARTAN 24-26 MG PO TABS: 1.0000 | ORAL_TABLET | Freq: Two times a day (BID) | ORAL | 11 refills | Status: DC

## 2019-09-29 MED ORDER — FUROSEMIDE 40 MG PO TABS
40.0000 mg | ORAL_TABLET | Freq: Two times a day (BID) | ORAL | Status: DC
  Filled 2019-09-29: qty 1

## 2019-09-29 MED ORDER — SPIRONOLACTONE 25 MG PO TABS: 25.0000 mg | ORAL_TABLET | Freq: Every day | ORAL | 11 refills | Status: DC

## 2019-09-29 MED ORDER — DIGOXIN 125 MCG PO TABS: 0.1250 mg | ORAL_TABLET | Freq: Every day | ORAL | Status: DC

## 2019-09-29 MED ORDER — POTASSIUM CHLORIDE CRYS ER 20 MEQ PO TBCR
20.0000 meq | EXTENDED_RELEASE_TABLET | Freq: Every day | ORAL | Status: DC
  Filled 2019-09-29: qty 1

## 2019-09-29 MED ORDER — FUROSEMIDE 40 MG PO TABS: 40.0000 mg | ORAL_TABLET | Freq: Two times a day (BID) | ORAL | 11 refills | Status: DC

## 2019-09-29 MED ORDER — CARVEDILOL 3.125 MG PO TABS: 3.1250 mg | ORAL_TABLET | Freq: Two times a day (BID) | ORAL | Status: DC

## 2019-09-29 MED ORDER — SPIRONOLACTONE 25 MG PO TABS: 25.0000 mg | ORAL_TABLET | Freq: Every day | ORAL | Status: DC

## 2019-09-29 NOTE — Plan of Care (Signed)
°  Problem: Activity: °Goal: Ability to return to baseline activity level will improve °Outcome: Progressing °  °Problem: Cardiovascular: °Goal: Ability to achieve and maintain adequate cardiovascular perfusion will improve °Outcome: Progressing °  °

## 2019-09-29 NOTE — Progress Notes (Signed)
Patient ID: Alexandria Ruiz, female   DOB: [birth year], 64 y.o.   MRN: [id number]     Advanced Heart Failure Rounding Note  PCP-Cardiologist: Elouise Munroe, MD   Subjective:    Doing much better, able to walk to bathroom without dyspnea.  Has had a lot of shortness of breath at home prior to admission.    Co-ox 52% today but done in early am.  CVP 7-8 on my read.   Objective:   Weight Range: 135.2 kg Body mass index is 41.56 kg/m.   Vital Signs:   Temp:  [97.7 F (36.5 C)-98.7 F (37.1 C)] 97.9 F (36.6 C) (07/17 0800) Pulse Rate:  [89-99] 91 (07/17 0800) Resp:  [14-31] 19 (07/17 0800) BP: (85-119)/(55-80) 119/80 (07/17 0800) SpO2:  [91 %-100 %] 96 % (07/17 0800) Weight:  [135.2 kg-135.9 kg] 135.2 kg (07/17 0552) Last BM Date: 09/27/19  Weight change: Filed Weights   09/28/19 0500 09/28/19 2056 09/29/19 0552  Weight: 134.4 kg 135.9 kg 135.2 kg    Intake/Output:   Intake/Output Summary (Last 24 hours) at 09/29/2019 1053 Last data filed at 09/29/2019 0900 Gross per 24 hour  Intake 953.08 ml  Output 450 ml  Net 503.08 ml      Physical Exam   CVP 7-8 General: NAD Neck: JVP 8 cm, no thyromegaly or thyroid nodule.  Lungs: Clear to auscultation bilaterally with normal respiratory effort. CV: Nondisplaced PMI.  Heart regular S1/S2, no S3/S4, no murmur.  No peripheral edema.   Abdomen: Soft, nontender, no hepatosplenomegaly, no distention.  Skin: Intact without lesions or rashes.  Neurologic: Alert and oriented x 3.  Psych: Normal affect. Extremities: No clubbing or cyanosis.  HEENT: Normal.    Telemetry   NSR 90s-100s, personally reviewed.   EKG   n/a  Labs    CBC Recent Labs    09/27/19 1255 09/28/19 0517  WBC 7.1 8.1  HGB 11.6* 11.1*  HCT 37.5 36.2  MCV 92.4 93.1  PLT 262 017   Basic Metabolic Panel Recent Labs    09/27/19 1925 09/28/19 0015 09/28/19 0517 09/29/19 0443  NA 137   < > 139 139  K 3.7   < > 4.0 4.0  CL 105   < > 104  107  CO2 22   < > 23 24  GLUCOSE 174*   < > 197* 124*  BUN 15   < > 13 12  CREATININE 1.21*   < > 1.12* 1.09*  CALCIUM 8.8*   < > 8.9 8.6*  MG 2.3  --  2.1  --    < > = values in this interval not displayed.   Liver Function Tests No results for input(s): AST, ALT, ALKPHOS, BILITOT, PROT, ALBUMIN in the last 72 hours. No results for input(s): LIPASE, AMYLASE in the last 72 hours. Cardiac Enzymes No results for input(s): CKTOTAL, CKMB, CKMBINDEX, TROPONINI in the last 72 hours.  BNP: BNP (last 3 results) Recent Labs    05/23/19 1817  BNP 713.2*    ProBNP (last 3 results) No results for input(s): PROBNP in the last 8760 hours.   D-Dimer No results for input(s): DDIMER in the last 72 hours. Hemoglobin A1C Recent Labs    09/27/19 1230  HGBA1C 5.9*   Fasting Lipid Panel No results for input(s): CHOL, HDL, LDLCALC, TRIG, CHOLHDL, LDLDIRECT in the last 72 hours. Thyroid Function Tests Recent Labs    09/27/19 1230  TSH 1.215    Other results:  Imaging    No results found.   Medications:     Scheduled Medications: . atorvastatin  40 mg Oral Q M,W,F  . carvedilol  3.125 mg Oral BID WC  . Chlorhexidine Gluconate Cloth  6 each Topical Daily  . digoxin  0.125 mg Oral Daily  . DULoxetine  60 mg Oral BID  . enoxaparin (LOVENOX) injection  40 mg Subcutaneous Q24H  . estradiol  1 mg Oral QHS  . furosemide  40 mg Oral BID  . insulin aspart  0-5 Units Subcutaneous QHS  . insulin aspart  0-9 Units Subcutaneous TID WC  . iron polysaccharides  150 mg Oral BID  . mouth rinse  15 mL Mouth Rinse BID  . mometasone-formoterol  2 puff Inhalation BID  . potassium chloride  20 mEq Oral BID  . potassium chloride  20 mEq Oral Daily  . sacubitril-valsartan  1 tablet Oral BID  . sodium chloride flush  10-40 mL Intracatheter Q12H  . sodium chloride flush  3 mL Intravenous Q12H  . spironolactone  12.5 mg Oral Daily  . sucralfate  1 g Oral QID  . umeclidinium bromide  1 puff  Inhalation Daily  . valACYclovir  500 mg Oral Daily    Infusions: . sodium chloride    . nitroGLYCERIN Stopped (09/27/19 1739)    PRN Medications: sodium chloride, acetaminophen, albuterol, ondansetron (ZOFRAN) IV, sodium chloride flush, sodium chloride flush      Assessment/Plan   1. Acute on chronic systolic CHF: Nonischemic cardiomyopathy.  Echo (6/21) showed EF 35-40%, cath this admission with markedly elevated filling pressures and low CI 1.3.  Possible hypertensive cardiomyopathy.  She had marked improvement rapidly with BP lowering and diuresis.   I suspect she had flash pulmonary edema in the cath lab due to  hypertensive emergency.  Today, co-ox 52% off milrinone but drawn in the early am.  CVP 7-8. BP controlled. - Repeat co-ox.  - Continue Entresto 24/26 bid - Increase spironolactone to 25 mg daily for home.  - Add Coreg 3.125 mg bid for home - Lasix 40 mg po bid for home.  2. HTN: Long-standing, suspect hypertensive emergency at admission with flash pulmonary edema.  BP now much lower and off NTG gtt.   3. OSA: Continue CPAP. 4. Type II diabetes: Start SGLT2 inhibitor as outpatient.   5. Disposition: I think she can go home today.  She will need followup in CHF clinic.  Meds for discharge: Coreg 3.125 mg bid, spironolactone 25 mg daily, Entresto 24/26 bid, Lasix 40 mg po bid, KCl 20 daily, atorvastatin 40 daily.    Loralie Champagne 09/29/2019 10:53 AM

## 2019-09-29 NOTE — Discharge Summary (Signed)
Discharge Summary    Patient ID: Alexandria Ruiz MRN: [id number]; DOB: 1955-04-05  Admit date: 09/27/2019 Discharge date: 09/29/2019  Primary Care Provider: Maury Dus, MD  Primary Cardiologist: Elouise Munroe, MD   Primary Electrophysiologist:  None   Advanced Heart Failure:  Loralie Champagne, MD   Discharge Diagnoses    Principal Problem:   Acute on chronic combined systolic and diastolic CHF (congestive heart failure) ([place]) Active Problems:   NICM (nonischemic cardiomyopathy) ([place])   Hypertensive heart disease with CHF (congestive heart failure) ([facility])   LVH (left ventricular hypertrophy)   OSA (obstructive sleep apnea)   Type 2 diabetes mellitus ([place])   HLD (hyperlipidemia)   Diagnostic Studies/Procedures     CARDIAC CATHETERIZATION  07/09/621  LV end diastolic pressure is severely elevated.   Hemodynamic findings consistent with severe pulmonary hypertension.   Mean RA 35, RVSP 65, mean PA 55, mean PCWP 46, LVEDP 30 CO 3.3, CI 1.33 1. Normal coronary anatomy  2. Severely elevated LV filling pressures  3. Severe pulmonary venous hypertension  4. Severely depressed cardiac output with index 1.3  Plan: will admit. Patient is significantly SOB during the procedure. She is hypertensive. Will start IV Ntg and IV lasix. Will consult Advanced heart failure service.   _____________   History of Present Illness     Alexandria Ruiz is a 65 y.o. female with hypertension, hyperlipidemia, diabetes mellitus, sleep apnea on CPAP.  She was recently evaluated by Dr. Margaretann Loveless for shortness of breath.  Myoview and Echocardiogram demonstrated reduced LVF (EF 35-40%) and RVSP 45.8.  She was set up for R and L cardiac catheterization for further evaluation.    Hospital Course     Consultants: Advanced Heart Failure Team   She presented to [facility] for cardiac catheterization on 09/27/19.  This demonstrated normal coronary arteries, markedly elevated R and L heart filling  pressures and low CI.  She was hypertensive in the cath lab and she was admitted for diuresis and management of low output.  She was started on IV NTG, milrinone and Furosemide.    The patient was seen by Dr. Aundra Dubin with the CHF service.  Co-ox had improved from 33% to 73% with brisk diuresis.  It was suspected she had flash pulmonary edema in the cardiac catheterization lab due to hypertensive emergency.  She likely has a hypertensive cardiomyopathy.  She was started on Entresto, Spironolactone.  She continued to improve and was weaned off of Milrinone.  She was ultimately started on beta-blocker with Carvedilol.  She was seen by Dr. Aundra Dubin this morning.  She is in improved condition and felt stable enough for DC to home.     Did the patient have an acute coronary syndrome (MI, NSTEMI, STEMI, etc) this admission?:  No                               Did the patient have a percutaneous coronary intervention (stent / angioplasty)?:  No.   _____________  Discharge Vitals Blood pressure 120/77, pulse 93, temperature 98.5 F (36.9 C), temperature source Oral, resp. rate 18, height 5\' 11"  (1.803 m), weight 135.2 kg, SpO2 97 %.  Filed Weights   09/28/19 0500 09/28/19 2056 09/29/19 0552  Weight: 134.4 kg 135.9 kg 135.2 kg    Labs & Radiologic Studies    CBC Recent Labs    09/27/19 1255 09/28/19 0517  WBC 7.1 8.1  HGB 11.6* 11.1*  HCT 37.5 36.2  MCV 92.4 93.1  PLT 262 557   Basic Metabolic Panel Recent Labs    09/27/19 1925 09/28/19 0015 09/28/19 0517 09/29/19 0443  NA 137   < > 139 139  K 3.7   < > 4.0 4.0  CL 105   < > 104 107  CO2 22   < > 23 24  GLUCOSE 174*   < > 197* 124*  BUN 15   < > 13 12  CREATININE 1.21*   < > 1.12* 1.09*  CALCIUM 8.8*   < > 8.9 8.6*  MG 2.3  --  2.1  --    < > = values in this interval not displayed.   Hemoglobin A1C Recent Labs    09/27/19 1230  HGBA1C 5.9*   Thyroid Function Tests Recent Labs    09/27/19 1230  TSH 1.215   _____________       Disposition   Pt is being discharged home today in good condition.  Follow-up Plans & Appointments     Follow-up Information    [facility] Follow up on 10/18/2019.   Specialty: Cardiology Why: 10:30  Contact information: [address] [id number] [place]       Elouise Munroe, MD Follow up on 10/08/2019.   Specialties: Cardiology, Radiology Why: 9 am Contact information: [address] [place] [address] [place] Follow up.   Specialty: Cardiology Why: The office will call to arrange a follow up lab test around Wed or Thurs next week Contact information: [address] [place] [phone]       Maury Dus, MD.   Specialty: Family Medicine Why: Please follow up in a week Contact information: [place] [address] [phone]              Discharge Instructions    (Ruth) Call MD:  Anytime you have any of the following symptoms: 1) 3 pound weight gain in 24 hours or 5 pounds in 1 week 2) shortness of breath, with or without a dry hacking cough 3) swelling in the hands, feet or stomach 4) if you have to sleep on extra pillows at night in order to breathe.   Complete by: As directed    Diet - low sodium heart healthy   Complete by: As directed    Diet Carb Modified   Complete by: As directed    Discharge wound care:   Complete by: As directed    Call Dr. Delphina Cahill office for any swelling, bleeding, bruising or fever.   Driving Restrictions   Complete by: As directed    None for 3 days   Increase activity slowly   Complete by: As directed    Lifting restrictions   Complete by: As directed    No lifting over 5 lbs for 1 week      Discharge Medications   Allergies as of 09/29/2019      Reactions   Hydrocodone  Itching   Oxycodone Itching      Medication List    STOP taking these medications   metoprolol succinate 25 MG 24 hr tablet Commonly known as: Toprol XL   potassium chloride 10 MEQ tablet Commonly known as: KLOR-CON     TAKE these medications   albuterol 108 (90 Base) MCG/ACT inhaler  Commonly known as: VENTOLIN HFA Inhale 2 puffs into the lungs every 6 (six) hours as needed for wheezing.   albuterol (2.5 MG/3ML) 0.083% nebulizer solution Commonly known as: PROVENTIL Take 2.5 mg by nebulization every 4 (four) hours as needed for wheezing or shortness of breath.   ALPRAZolam 0.5 MG tablet Commonly known as: XANAX Take 0.25 mg by mouth daily as needed for anxiety.   atorvastatin 40 MG tablet Commonly known as: LIPITOR Take 40 mg by mouth every Monday, Wednesday, and Friday.   budesonide-formoterol 160-4.5 MCG/ACT inhaler Commonly known as: SYMBICORT Inhale 2 puffs into the lungs 2 (two) times daily.   carvedilol 3.125 MG tablet Commonly known as: COREG Take 1 tablet (3.125 mg total) by mouth 2 (two) times daily with a meal.   DULoxetine 60 MG capsule Commonly known as: CYMBALTA Take 60 mg by mouth 2 (two) times daily.   estradiol 1 MG tablet Commonly known as: ESTRACE Take 1 mg by mouth at bedtime.   Ferrex 150 150 MG capsule Generic drug: iron polysaccharides Take 150 mg by mouth 2 (two) times daily.   furosemide 40 MG tablet Commonly known as: LASIX Take 1 tablet (40 mg total) by mouth 2 (two) times daily. What changed:   medication strength  how much to take  when to take this  Another medication with the same name was removed. Continue taking this medication, and follow the directions you see here.   potassium chloride SA 20 MEQ tablet Commonly known as: KLOR-CON Take 1 tablet (20 mEq total) by mouth daily.   sacubitril-valsartan 24-26 MG Commonly known as: ENTRESTO Take 1 tablet by mouth 2 (two) times daily.   Spiriva Respimat 1.25 MCG/ACT  Aers Generic drug: Tiotropium Bromide Monohydrate Take 2 puffs by mouth daily.   spironolactone 25 MG tablet Commonly known as: ALDACTONE Take 1 tablet (25 mg total) by mouth daily. Start taking on: September 30, 2019   sucralfate 1 g tablet Commonly known as: CARAFATE Take 1 g by mouth 4 (four) times daily.   Trulicity 1.5 ZO/1.0RU Sopn Generic drug: Dulaglutide Inject 1.5 mg into the skin every Saturday.   valACYclovir 500 MG tablet Commonly known as: VALTREX Take 500 mg by mouth daily.            Discharge Care Instructions  (From admission, onward)         Start     Ordered   09/29/19 0000  Discharge wound care:       Comments: Call Dr. Delphina Cahill office for any swelling, bleeding, bruising or fever.   09/29/19 1231             Outstanding Labs/Studies   BMET in 5 days  Duration of Discharge Encounter   Greater than 30 minutes including physician time.  Signed, Richardson Dopp, PA-C 09/29/2019, 12:37 PM

## 2019-09-29 NOTE — Care Management (Signed)
Spoke w pharmacist at [facility], copay is $0 for [employer]

## 2019-09-29 NOTE — Progress Notes (Signed)
CARDIAC REHAB PHASE I   PRE:  Rate/Rhythm: 89 SR  BP:  Supine: 102/83  Sitting:   Standing:    SaO2: 96%RA   8280-0349 Pt getting ready to bath. Did not want to walk at this time. Pt stated wants to go home. Discussed that would need to try to get off oxygen. RN in room and turned down to 1L.  No question re ed done yesterday. PT to see later. Will continue to follow.      Graylon Good, RN BSN  09/29/2019 9:36 AM

## 2019-09-29 NOTE — Care Management (Signed)
Benefit check sent for Entresto 

## 2019-09-29 NOTE — Evaluation (Signed)
Physical Therapy Evaluation Patient Details Name: Alexandria Ruiz MRN: [id number] DOB: 15-Mar-1956 Today's Date: 09/29/2019   History of Present Illness  Ms Cribb is a 64 year old with history of asthma, HTN, HLD, DMII, OA,  RTKR& LTKR, OSA and recently noted to have reduced EF on myoview and ECHO. She presents to [facility] for R and L heart cath.   Clinical Impression  Patient evaluated by Physical Therapy with no further acute PT needs identified. All education has been completed and the patient has no further questions. Pt supervision with mobility however SPO2 dropped to 83% when ambualting on RW and HR increased to 120 bpm. SPO2 returned to 91% with 3 mins seated rest and upper 90's with 2L O2. Pt quite agitated because she is allergic to something in her room that is making her itch and cough. Recommend outpt cardiac rehab but no further PT needs.   See below for any follow-up Physical Therapy or equipment needs. PT is signing off. Thank you for this referral.     Follow Up Recommendations No PT follow up;Other (comment) (cardiac rehab)    Equipment Recommendations  None recommended by PT    Recommendations for Other Services       Precautions / Restrictions Precautions Precaution Comments: watch sats Restrictions Weight Bearing Restrictions: Yes Other Position/Activity Restrictions: RUE      Mobility  Bed Mobility Overal bed mobility: Modified Independent                Transfers Overall transfer level: Modified independent Equipment used: None             General transfer comment: safe with sit<>stand  Ambulation/Gait Ambulation/Gait assistance: Supervision Gait Distance (Feet): 100 Feet Assistive device: None Gait Pattern/deviations: Antalgic;Wide base of support Gait velocity: decreased Gait velocity interpretation: <1.8 ft/sec, indicate of risk for recurrent falls General Gait Details: at 50', pt's SPO2 dropped to 83% and HR 120 bpm. Took standing  rest break and cued pt to return to room  Stairs            Wheelchair Mobility    Modified Rankin (Stroke Patients Only)       Balance Overall balance assessment: Mild deficits observed, not formally tested                                           Pertinent Vitals/Pain Pain Assessment: No/denies pain    Home Living Family/patient expects to be discharged to:: Private residence Living Arrangements: Spouse/significant other Available Help at Discharge: Family;Available PRN/intermittently Type of Home: House Home Access: Stairs to enter Entrance Stairs-Rails: Right;Left;Can reach both Entrance Stairs-Number of Steps: 4 Home Layout: One level Home Equipment: Walker - 4 wheels      Prior Function Level of Independence: Independent with assistive device(s)               Hand Dominance        Extremity/Trunk Assessment   Upper Extremity Assessment Upper Extremity Assessment: Overall WFL for tasks assessed    Lower Extremity Assessment Lower Extremity Assessment: Overall WFL for tasks assessed    Cervical / Trunk Assessment Cervical / Trunk Assessment: Other exceptions Cervical / Trunk Exceptions: increased body habitus  Communication   Communication: No difficulties  Cognition Arousal/Alertness: Awake/alert Behavior During Therapy: WFL for tasks assessed/performed Overall Cognitive Status: Within Functional Limits for tasks assessed  General Comments: WFL though pt very agitated because she is waiting for PICC removal to go home      General Comments General comments (skin integrity, edema, etc.): discussed hypoxia and pt's need for self monitoring and possible supplemental O2. Pt's SPO2 returned to 91% with seated rest x3 mins on RA and then upper 90's on 2L.     Exercises     Assessment/Plan    PT Assessment Patent does not need any further PT services  PT Problem List          PT Treatment Interventions      PT Goals (Current goals can be found in the Care Plan section)  Acute Rehab PT Goals Patient Stated Goal: return home now PT Goal Formulation: All assessment and education complete, DC therapy    Frequency     Barriers to discharge        Co-evaluation               AM-PAC PT "6 Clicks" Mobility  Outcome Measure Help needed turning from your back to your side while in a flat bed without using bedrails?: None Help needed moving from lying on your back to sitting on the side of a flat bed without using bedrails?: None Help needed moving to and from a bed to a chair (including a wheelchair)?: None Help needed standing up from a chair using your arms (e.g., wheelchair or bedside chair)?: None Help needed to walk in hospital room?: None Help needed climbing 3-5 steps with a railing? : A Little 6 Click Score: 23    End of Session Equipment Utilized During Treatment: Oxygen Activity Tolerance: Treatment limited secondary to agitation Patient left: in bed;with call bell/phone within reach Nurse Communication: Mobility status PT Visit Diagnosis: Other abnormalities of gait and mobility (R26.89)    Time: 4132-4401 PT Time Calculation (min) (ACUTE ONLY): 14 min   Charges:   PT Evaluation $PT Eval Low Complexity: 1 Low          [place]  Pager [phone] Office [place] 09/29/2019, 4:37 PM

## 2019-09-29 NOTE — Plan of Care (Signed)

## 2019-09-29 NOTE — Progress Notes (Signed)
IV team pulled PICC line and informed patient to remain in the bed for 64min however patient refused. Once PICC removed patient requested for RN to provide discharge instructions so she could be discharged. Patient aware and educated that she needed to remain in bed but still refused.

## 2019-09-29 NOTE — Progress Notes (Signed)
Pt arrived to unit

## 2019-10-03 ENCOUNTER — Telehealth: Payer: Self-pay | Admitting: Internal Medicine

## 2019-10-03 NOTE — Telephone Encounter (Signed)
New message  Patient was contacted to make aware that BMET was due for this week per staff message from [facility]. Patient also cancelled appt with Dr. Margaretann Loveless on Monday 10/08/19. States that she would like to speak with the nurse about everything and reschedule with her. Please give patient a call back to discuss.

## 2019-10-03 NOTE — Telephone Encounter (Signed)
I would be happy to see her Monday 7/26 at 4:20 pm.

## 2019-10-03 NOTE — Telephone Encounter (Signed)
Called patient- she was upset and did not understand why she needed to have blood work- I explained that they wanted to recheck BMET, because of all the medications she is on and to make sure of her kidney function stays stable. Patient states that she will come next week but she absolutely can not come this week.  Patient was advised after her cath she needed an appointment to be seen and she cancelled that- patient is only able to come on Monday after 2:00 pm, I advised there was no other spots but she would like to be called back when she can be worked in.   I will route to MD/RN to advise.

## 2019-10-04 NOTE — Telephone Encounter (Signed)
Follow up scheduled. Left message of appointment date and time on voicemail. She is to call back to confirm.

## 2019-10-08 ENCOUNTER — Encounter: Payer: Self-pay | Admitting: Internal Medicine

## 2019-10-08 ENCOUNTER — Other Ambulatory Visit: Payer: Self-pay

## 2019-10-08 ENCOUNTER — Ambulatory Visit: Payer: 59 | Admitting: Internal Medicine

## 2019-10-08 VITALS — BP 130/78 | HR 100 | Temp 96.6°F | Ht 71.0 in | Wt 285.0 lb

## 2019-10-08 DIAGNOSIS — E785 Hyperlipidemia, unspecified: Secondary | ICD-10-CM

## 2019-10-08 DIAGNOSIS — I10 Essential (primary) hypertension: Secondary | ICD-10-CM

## 2019-10-08 DIAGNOSIS — R0609 Other forms of dyspnea: Secondary | ICD-10-CM

## 2019-10-08 DIAGNOSIS — R0600 Dyspnea, unspecified: Secondary | ICD-10-CM

## 2019-10-08 DIAGNOSIS — I428 Other cardiomyopathies: Secondary | ICD-10-CM | POA: Diagnosis not present

## 2019-10-08 DIAGNOSIS — I517 Cardiomegaly: Secondary | ICD-10-CM

## 2019-10-08 DIAGNOSIS — I5043 Acute on chronic combined systolic (congestive) and diastolic (congestive) heart failure: Secondary | ICD-10-CM | POA: Diagnosis not present

## 2019-10-08 DIAGNOSIS — I110 Hypertensive heart disease with heart failure: Secondary | ICD-10-CM | POA: Diagnosis not present

## 2019-10-08 DIAGNOSIS — G4733 Obstructive sleep apnea (adult) (pediatric): Secondary | ICD-10-CM

## 2019-10-08 DIAGNOSIS — I5042 Chronic combined systolic (congestive) and diastolic (congestive) heart failure: Secondary | ICD-10-CM

## 2019-10-08 MED ORDER — CARVEDILOL 6.25 MG PO TABS: 6.2500 mg | ORAL_TABLET | Freq: Two times a day (BID) | ORAL | 11 refills | Status: DC

## 2019-10-08 NOTE — Patient Instructions (Signed)
Medication Instructions:  Increase Carvedilol to 6.25 mg twice a day  *If you need a refill on your cardiac medications before your next appointment, please call your pharmacy*   Lab Work: None   Testing/Procedures: Your physician has requested that you have an echocardiogram in 3 months. Echocardiography is a painless test that uses sound waves to create images of your heart. It provides your doctor with information about the size and shape of your heart and how well your heart's chambers and valves are working. This procedure takes approximately one hour. There are no restrictions for this procedure. [place] [address]    Follow-Up: At [facility], you and your health needs are our priority.  As part of our continuing mission to provide you with exceptional heart care, we have created designated Provider Care Teams.  These Care Teams include your primary Cardiologist (physician) and Advanced Practice Providers (APPs -  Physician Assistants and Nurse Practitioners) who all work together to provide you with the care you need, when you need it.  We recommend signing up for the patient portal called "MyChart".  Sign up information is provided on this After Visit Summary.  MyChart is used to connect with patients for Virtual Visits (Telemedicine).  Patients are able to view lab/test results, encounter notes, upcoming appointments, etc.  Non-urgent messages can be sent to your provider as well.   To learn more about what you can do with MyChart, go to [email or web address].    Your next appointment:   3 month(s)  The format for your next appointment:   In Person  Provider:   Cherlynn Kaiser, MD

## 2019-10-08 NOTE — Progress Notes (Signed)
Cardiology Office Note:    Date:  10/08/2019   ID:  Alexandria Ruiz, DOB Mar 16, 1955, MRN [id number]  PCP:  Maury Dus, MD  Cardiologist:  Elouise Munroe, MD  Electrophysiologist:  None   Referring MD: Maury Dus, MD   Chief Complaint: Hypertensive cardiomyopathy  History of Present Illness:    Alexandria Ruiz is a 64 y.o. female with a history of nonischemic CM by recent cath and EF 35-40%. Markedly elevated filling pressure and low cardiac index. She had flash pulmonary edema in the cath lab requiring brief hospitalization. She was diuresed, placed on milrinone with improvement in hemodynamics.  HF therapy includes carvedilol, Entresto, spironolactone.   She feels much better. She is on lasix 40 mg bid and noticed the other day that she had to urinate when she arrived at work. She ran into the building to find a bathroom, and later realized that this would have made her very short of breath prior to hospitalization.   Denies chest pain or current SOB, no LE swelling, no PND or orthopnea.     Past Medical History:  Diagnosis Date  . Abdominal pain   . Allergic rhinitis   . Anemia   . Anxiety   . Arthritis   . Asthma   . Bronchitis   . Diabetes mellitus    type 2  . GERD (gastroesophageal reflux disease)   . Hyperlipidemia   . Hypertension   . Obesity   . Peripheral neuropathy    denies has carpal tunnel  . PONV (postoperative nausea and vomiting)   . Shortness of breath dyspnea    since starting Trulicity diabetic med  . Sleep apnea    uses c-pap machine    Past Surgical History:  Procedure Laterality Date  . ABDOMINAL HYSTERECTOMY    . BREAST REDUCTION SURGERY    . HAMMER TOE SURGERY     bilateral feet  . JOINT REPLACEMENT     R TKA Dr. Wynelle Link 07-25-17  . NOSE SURGERY     sinus surgery  . ORIF ANKLE FRACTURE  06/29/2011   Procedure: OPEN REDUCTION INTERNAL FIXATION (ORIF) ANKLE FRACTURE;  Surgeon: Sharmon Revere, MD;  Location: [facility];   Service: Orthopedics;  Laterality: Right;  . right rotator cuff surgery - 11-2013    . RIGHT/LEFT HEART CATH AND CORONARY ANGIOGRAPHY N/A 09/27/2019   Procedure: RIGHT/LEFT HEART CATH AND CORONARY ANGIOGRAPHY;  Surgeon: Martinique, Peter M, MD;  Location: [place] [facility];  Service: Cardiovascular;  Laterality: N/A;  . STERIOD INJECTION Right 06/23/2015   Procedure: RIGHT KNEE CORTISONE INJECTION;  Surgeon: Gaynelle Arabian, MD;  Location: [facility];  Service: Orthopedics;  Laterality: Right;  . TOTAL KNEE ARTHROPLASTY Left 06/23/2015   Procedure: TOTAL LEFT KNEE ARTHROPLASTY;  Surgeon: Gaynelle Arabian, MD;  Location: [facility];  Service: Orthopedics;  Laterality: Left;  . TOTAL KNEE ARTHROPLASTY Right 07/25/2017   Procedure: RIGHT TOTAL KNEE ARTHROPLASTY;  Surgeon: Gaynelle Arabian, MD;  Location: [facility];  Service: Orthopedics;  Laterality: Right;  Adductor Block    Current Medications: Current Meds  Medication Sig  . albuterol (PROVENTIL HFA;VENTOLIN HFA) 108 (90 BASE) MCG/ACT inhaler Inhale 2 puffs into the lungs every 6 (six) hours as needed for wheezing.  Marland Kitchen albuterol (PROVENTIL) (2.5 MG/3ML) 0.083% nebulizer solution Take 2.5 mg by nebulization every 4 (four) hours as needed for wheezing or shortness of breath.   . ALPRAZolam (XANAX) 0.5 MG tablet Take 0.25 mg by mouth daily as needed for anxiety.   Marland Kitchen  atorvastatin (LIPITOR) 40 MG tablet Take 40 mg by mouth every Monday, Wednesday, and Friday.   . budesonide-formoterol (SYMBICORT) 160-4.5 MCG/ACT inhaler Inhale 2 puffs into the lungs 2 (two) times daily.  . carvedilol (COREG) 6.25 MG tablet Take 1 tablet (6.25 mg total) by mouth 2 (two) times daily with a meal.  . DULoxetine (CYMBALTA) 60 MG capsule Take 60 mg by mouth 2 (two) times daily.  Marland Kitchen estradiol (ESTRACE) 1 MG tablet Take 1 mg by mouth at bedtime.  Marland Kitchen FERREX 150 150 MG capsule Take 150 mg by mouth 2 (two) times daily.  . furosemide (LASIX) 40 MG tablet Take 1 tablet (40 mg total) by mouth 2 (two) times  daily.  . potassium chloride SA (KLOR-CON) 20 MEQ tablet Take 1 tablet (20 mEq total) by mouth daily.  . sacubitril-valsartan (ENTRESTO) 24-26 MG Take 1 tablet by mouth 2 (two) times daily.  Marland Kitchen SPIRIVA RESPIMAT 1.25 MCG/ACT AERS Take 2 puffs by mouth daily.   Marland Kitchen spironolactone (ALDACTONE) 25 MG tablet Take 1 tablet (25 mg total) by mouth daily.  . sucralfate (CARAFATE) 1 g tablet Take 1 g by mouth 4 (four) times daily.  . TRULICITY 1.5 GE/3.6OQ SOPN Inject 1.5 mg into the skin every Saturday.   . valACYclovir (VALTREX) 500 MG tablet Take 500 mg by mouth daily.  . [DISCONTINUED] carvedilol (COREG) 3.125 MG tablet Take 1 tablet (3.125 mg total) by mouth 2 (two) times daily with a meal.     Allergies:   Hydrocodone and Oxycodone   Social History   Socioeconomic History  . Marital status: Married    Spouse name: Not on file  . Number of children: Not on file  . Years of education: Not on file  . Highest education level: Not on file  Occupational History  . Not on file  Tobacco Use  . Smoking status: Former Smoker    Packs/day: 0.50    Years: 30.00    Pack years: 15.00    Quit date: 10/29/2006    Years since quitting: 12.9  . Smokeless tobacco: Never Used  Vaping Use  . Vaping Use: Never used  Substance and Sexual Activity  . Alcohol use: No  . Drug use: No  . Sexual activity: Not Currently  Other Topics Concern  . Not on file  Social History Narrative  . Not on file   Social Determinants of Health   Financial Resource Strain:   . Difficulty of Paying Living Expenses:   Food Insecurity:   . Worried About Charity fundraiser in the Last Year:   . Arboriculturist in the Last Year:   Transportation Needs:   . Film/video editor (Medical):   Marland Kitchen Lack of Transportation (Non-Medical):   Physical Activity:   . Days of Exercise per Week:   . Minutes of Exercise per Session:   Stress:   . Feeling of Stress :   Social Connections:   . Frequency of Communication with Friends  and Family:   . Frequency of Social Gatherings with Friends and Family:   . Attends Religious Services:   . Active Member of Clubs or Organizations:   . Attends Archivist Meetings:   Marland Kitchen Marital Status:      Family History: The patient's family history is not on file. She was adopted.  ROS:   Please see the history of present illness.    All other systems reviewed and are negative.  EKGs/Labs/Other Studies Reviewed:  The following studies were reviewed today:  EKG:  n/a  Recent Labs: 05/23/2019: ALT 44; B Natriuretic Peptide 713.2 09/27/2019: TSH 1.215 09/28/2019: Hemoglobin 11.1; Magnesium 2.1; Platelets 256 09/29/2019: BUN 12; Creatinine, Ser 1.09; Potassium 4.0; Sodium 139  Recent Lipid Panel No results found for: CHOL, TRIG, HDL, CHOLHDL, VLDL, LDLCALC, LDLDIRECT  Physical Exam:    VS:  BP (!) 130/78   Pulse 100   Temp (!) 96.6 F (35.9 C)   Ht 5\' 11"  (1.803 m)   Wt (!) 285 lb (129.3 kg)   SpO2 96%   BMI 39.75 kg/m     Wt Readings from Last 5 Encounters:  10/08/19 (!) 285 lb (129.3 kg)  09/29/19 298 lb (135.2 kg)  09/19/19 (!) 305 lb (138.3 kg)  08/24/19 291 lb 9.6 oz (132.3 kg)  08/21/19 290 lb (131.5 kg)     Constitutional: No acute distress Eyes: sclera non-icteric, normal conjunctiva and lids ENMT: normal dentition, moist mucous membranes Cardiovascular: regular rhythm, normal rate, no murmurs. S1 and S2 normal. Radial pulses normal bilaterally. No jugular venous distention.  Respiratory: clear to auscultation bilaterally GI : normal bowel sounds, soft and nontender. No distention.   MSK: extremities warm, well perfused. No edema.  NEURO: grossly nonfocal exam, moves all extremities. PSYCH: alert and oriented x 3, normal mood and affect.   ASSESSMENT:    1. Acute on chronic combined systolic and diastolic CHF (congestive heart failure) ([place])   2. NICM (nonischemic cardiomyopathy) ([place])   3. Hypertensive heart disease with chronic combined  systolic and diastolic congestive heart failure ([place])   4. Dyspnea on exertion   5. Essential hypertension   6. LVH (left ventricular hypertrophy)   7. Hyperlipidemia, unspecified hyperlipidemia type   8. OSA (obstructive sleep apnea)    PLAN:    Discussed plan for medication titration and follow up in detail with patient today. I will uptitrate her carvedilol today to 6.25 mg BID, and would anticipate that when she sees HF clinic next week they can initiate SGLT2I with assistance if needed from pharmacist.  Continue entresto and spironolactone at current doses.   Continue atorvastatin and lasix at present doses.   Follow up with me in 3 mo with repeat echo to assess LV function on GDMT.   Total time of encounter: 30 minutes total time of encounter, including 25 minutes spent in face-to-face patient care on the date of this encounter. This time includes coordination of care and counseling regarding above mentioned problem list. Remainder of non-face-to-face time involved reviewing chart documents/testing relevant to the patient encounter and documentation in the medical record. I have independently reviewed documentation from referring provider.   Cherlynn Kaiser, MD [place]  [facility]    Medication Adjustments/Labs and Tests Ordered: Current medicines are reviewed at length with the patient today.  Concerns regarding medicines are outlined above.  Orders Placed This Encounter  Procedures  . ECHOCARDIOGRAM COMPLETE   Meds ordered this encounter  Medications  . carvedilol (COREG) 6.25 MG tablet    Sig: Take 1 tablet (6.25 mg total) by mouth 2 (two) times daily with a meal.    Dispense:  60 tablet    Refill:  11    Patient Instructions  Medication Instructions:  Increase Carvedilol to 6.25 mg twice a day  *If you need a refill on your cardiac medications before your next appointment, please call your pharmacy*   Lab Work: None   Testing/Procedures: Your  physician has requested that you have  an echocardiogram in 3 months. Echocardiography is a painless test that uses sound waves to create images of your heart. It provides your doctor with information about the size and shape of your heart and how well your heart's chambers and valves are working. This procedure takes approximately one hour. There are no restrictions for this procedure. [place] [address]    Follow-Up: At [facility], you and your health needs are our priority.  As part of our continuing mission to provide you with exceptional heart care, we have created designated Provider Care Teams.  These Care Teams include your primary Cardiologist (physician) and Advanced Practice Providers (APPs -  Physician Assistants and Nurse Practitioners) who all work together to provide you with the care you need, when you need it.  We recommend signing up for the patient portal called "MyChart".  Sign up information is provided on this After Visit Summary.  MyChart is used to connect with patients for Virtual Visits (Telemedicine).  Patients are able to view lab/test results, encounter notes, upcoming appointments, etc.  Non-urgent messages can be sent to your provider as well.   To learn more about what you can do with MyChart, go to [email or web address].    Your next appointment:   3 month(s)  The format for your next appointment:   In Person  Provider:   Cherlynn Kaiser, MD

## 2019-10-18 ENCOUNTER — Encounter (HOSPITAL_COMMUNITY): Payer: Self-pay

## 2019-10-18 ENCOUNTER — Telehealth (HOSPITAL_COMMUNITY): Payer: Self-pay | Admitting: Cardiology

## 2019-10-18 ENCOUNTER — Ambulatory Visit (HOSPITAL_COMMUNITY)
Admit: 2019-10-18 | Discharge: 2019-10-18 | Disposition: A | Discharge status: Home / Self Care | Payer: 59 | Attending: Cardiology | Admitting: Cardiology

## 2019-10-18 ENCOUNTER — Other Ambulatory Visit: Payer: Self-pay

## 2019-10-18 VITALS — BP 128/86 | HR 92 | Ht 71.0 in | Wt 289.0 lb

## 2019-10-18 DIAGNOSIS — I5042 Chronic combined systolic (congestive) and diastolic (congestive) heart failure: Secondary | ICD-10-CM | POA: Diagnosis not present

## 2019-10-18 DIAGNOSIS — Z885 Allergy status to narcotic agent status: Secondary | ICD-10-CM | POA: Insufficient documentation

## 2019-10-18 DIAGNOSIS — E785 Hyperlipidemia, unspecified: Secondary | ICD-10-CM | POA: Diagnosis not present

## 2019-10-18 DIAGNOSIS — I5022 Chronic systolic (congestive) heart failure: Secondary | ICD-10-CM | POA: Insufficient documentation

## 2019-10-18 DIAGNOSIS — F419 Anxiety disorder, unspecified: Secondary | ICD-10-CM | POA: Diagnosis not present

## 2019-10-18 DIAGNOSIS — Z794 Long term (current) use of insulin: Secondary | ICD-10-CM | POA: Diagnosis not present

## 2019-10-18 DIAGNOSIS — G4733 Obstructive sleep apnea (adult) (pediatric): Secondary | ICD-10-CM | POA: Diagnosis not present

## 2019-10-18 DIAGNOSIS — M1990 Unspecified osteoarthritis, unspecified site: Secondary | ICD-10-CM | POA: Insufficient documentation

## 2019-10-18 DIAGNOSIS — Z87891 Personal history of nicotine dependence: Secondary | ICD-10-CM | POA: Diagnosis not present

## 2019-10-18 DIAGNOSIS — Z79899 Other long term (current) drug therapy: Secondary | ICD-10-CM | POA: Insufficient documentation

## 2019-10-18 DIAGNOSIS — K219 Gastro-esophageal reflux disease without esophagitis: Secondary | ICD-10-CM | POA: Insufficient documentation

## 2019-10-18 DIAGNOSIS — E119 Type 2 diabetes mellitus without complications: Secondary | ICD-10-CM | POA: Insufficient documentation

## 2019-10-18 DIAGNOSIS — I110 Hypertensive heart disease with heart failure: Secondary | ICD-10-CM | POA: Insufficient documentation

## 2019-10-18 DIAGNOSIS — I428 Other cardiomyopathies: Secondary | ICD-10-CM | POA: Insufficient documentation

## 2019-10-18 DIAGNOSIS — Z96653 Presence of artificial knee joint, bilateral: Secondary | ICD-10-CM | POA: Insufficient documentation

## 2019-10-18 DIAGNOSIS — J45909 Unspecified asthma, uncomplicated: Secondary | ICD-10-CM | POA: Diagnosis not present

## 2019-10-18 DIAGNOSIS — Z9989 Dependence on other enabling machines and devices: Secondary | ICD-10-CM | POA: Insufficient documentation

## 2019-10-18 DIAGNOSIS — Z7951 Long term (current) use of inhaled steroids: Secondary | ICD-10-CM | POA: Diagnosis not present

## 2019-10-18 DIAGNOSIS — E669 Obesity, unspecified: Secondary | ICD-10-CM | POA: Diagnosis not present

## 2019-10-18 MED ORDER — FUROSEMIDE 40 MG PO TABS: 40.0000 mg | ORAL_TABLET | Freq: Every day | ORAL | 11 refills | Status: DC

## 2019-10-18 NOTE — Progress Notes (Signed)
Advanced Heart Failure Clinic Note   Referring Physician: PCP: Maury Dus, MD PCP-Cardiologist: Elouise Munroe, MD  AHFC: Dr. Aundra Dubin    HPI: Ms Shambaugh is a 64 year old with history of asthma, HTN, HLD, DMII, OA,  RTKR& LTKR, OSA and recently diagnosed systolic heart failure.  Recently evaluated by Dr Margaretann Loveless for dyspnea. Had myoview and ECHO that showed reduced EF, 35-40%. She was set up [facility] on 09/27/19 that showed normal cors, elevated filling pressures, and low cardiac output, CI 1.3. She was hypertensive in the cath lab so Nitro drip was started. She was admitted, placed on milrinone and diuresed w/ IV Lasix. Cause of CM felt 2/2 hypertension. She was diuresed and started on GDMT. Milrinone weaned off w/ stable Co-ox (73%).  She presents to clinic today for post hospital f/u. Feels well. No cardiac symptoms. Ambulating w/o difficulty. No exertional dyspnea w/ ADLs. No LEE, orthopnea or PND. Reports compliance w/ medications. No side effects. BP is well controlled 128/86. Wt is down 10 lb since discharge, from 298>>288 lb. Tries to avoid salt.    Review of Systems: [y] = yes, [ ]  = no   General: Weight gain [ ] ; Weight loss [ ] ; Anorexia [ ] ; Fatigue [ ] ; Fever [ ] ; Chills [ ] ; Weakness [ ]   Cardiac: Chest pain/pressure [ ] ; Resting SOB [ ] ; Exertional SOB [ ] ; Orthopnea [ ] ; Pedal Edema [ ] ; Palpitations [ ] ; Syncope [ ] ; Presyncope [ ] ; Paroxysmal nocturnal dyspnea[ ]   Pulmonary: Cough [ ] ; Wheezing[ ] ; Hemoptysis[ ] ; Sputum [ ] ; Snoring [ ]   GI: Vomiting[ ] ; Dysphagia[ ] ; Melena[ ] ; Hematochezia [ ] ; Heartburn[ ] ; Abdominal pain [ ] ; Constipation [ ] ; Diarrhea [ ] ; BRBPR [ ]   GU: Hematuria[ ] ; Dysuria [ ] ; Nocturia[ ]   Vascular: Pain in legs with walking [ ] ; Pain in feet with lying flat [ ] ; Non-healing sores [ ] ; Stroke [ ] ; TIA [ ] ; Slurred speech [ ] ;  Neuro: Headaches[ ] ; Vertigo[ ] ; Seizures[ ] ; Paresthesias[ ] ;Blurred vision [ ] ; Diplopia [ ] ; Vision changes [ ]     Ortho/Skin: Arthritis [ ] ; Joint pain [ ] ; Muscle pain [ ] ; Joint swelling [ ] ; Back Pain [ ] ; Rash [ ]   Psych: Depression[ ] ; Anxiety[ ]   Heme: Bleeding problems [ ] ; Clotting disorders [ ] ; Anemia [ ]   Endocrine: Diabetes [ ] ; Thyroid dysfunction[ ]    Past Medical History:  Diagnosis Date  . Abdominal pain   . Allergic rhinitis   . Anemia   . Anxiety   . Arthritis   . Asthma   . Bronchitis   . Diabetes mellitus    type 2  . GERD (gastroesophageal reflux disease)   . Hyperlipidemia   . Hypertension   . Obesity   . Peripheral neuropathy    denies has carpal tunnel  . PONV (postoperative nausea and vomiting)   . Shortness of breath dyspnea    since starting Trulicity diabetic med  . Sleep apnea    uses c-pap machine    Current Outpatient Medications  Medication Sig Dispense Refill  . albuterol (PROVENTIL HFA;VENTOLIN HFA) 108 (90 BASE) MCG/ACT inhaler Inhale 2 puffs into the lungs every 6 (six) hours as needed for wheezing.    Marland Kitchen albuterol (PROVENTIL) (2.5 MG/3ML) 0.083% nebulizer solution Take 2.5 mg by nebulization every 4 (four) hours as needed for wheezing or shortness of breath.     . ALPRAZolam (XANAX) 0.5 MG tablet Take 0.25 mg by mouth  daily as needed for anxiety.     Marland Kitchen atorvastatin (LIPITOR) 40 MG tablet Take 40 mg by mouth every Monday, Wednesday, and Friday.     . budesonide-formoterol (SYMBICORT) 160-4.5 MCG/ACT inhaler Inhale 2 puffs into the lungs 2 (two) times daily.    . carvedilol (COREG) 6.25 MG tablet Take 1 tablet (6.25 mg total) by mouth 2 (two) times daily with a meal. 60 tablet 11  . DULoxetine (CYMBALTA) 60 MG capsule Take 60 mg by mouth 2 (two) times daily.    Marland Kitchen estradiol (ESTRACE) 1 MG tablet Take 1 mg by mouth at bedtime.    Marland Kitchen FERREX 150 150 MG capsule Take 150 mg by mouth 2 (two) times daily.  3  . furosemide (LASIX) 40 MG tablet Take 1 tablet (40 mg total) by mouth daily. 30 tablet 11  . potassium chloride SA (KLOR-CON) 20 MEQ tablet Take 1  tablet (20 mEq total) by mouth daily. 30 tablet 11  . SPIRIVA RESPIMAT 1.25 MCG/ACT AERS Take 2 puffs by mouth daily.     Marland Kitchen spironolactone (ALDACTONE) 25 MG tablet Take 1 tablet (25 mg total) by mouth daily. 30 tablet 11  . sucralfate (CARAFATE) 1 g tablet Take 1 g by mouth 4 (four) times daily.    . TRULICITY 1.5 TK/2.4OX SOPN Inject 1.5 mg into the skin every Saturday.   6  . valACYclovir (VALTREX) 500 MG tablet Take 500 mg by mouth daily.     No current facility-administered medications for this encounter.    Allergies  Allergen Reactions  . Hydrocodone Itching  . Oxycodone Itching      Social History   Socioeconomic History  . Marital status: Married    Spouse name: Not on file  . Number of children: Not on file  . Years of education: Not on file  . Highest education level: Not on file  Occupational History  . Not on file  Tobacco Use  . Smoking status: Former Smoker    Packs/day: 0.50    Years: 30.00    Pack years: 15.00    Quit date: 10/29/2006    Years since quitting: 12.9  . Smokeless tobacco: Never Used  Vaping Use  . Vaping Use: Never used  Substance and Sexual Activity  . Alcohol use: No  . Drug use: No  . Sexual activity: Not Currently  Other Topics Concern  . Not on file  Social History Narrative  . Not on file   Social Determinants of Health   Financial Resource Strain:   . Difficulty of Paying Living Expenses:   Food Insecurity:   . Worried About Charity fundraiser in the Last Year:   . Arboriculturist in the Last Year:   Transportation Needs:   . Film/video editor (Medical):   Marland Kitchen Lack of Transportation (Non-Medical):   Physical Activity:   . Days of Exercise per Week:   . Minutes of Exercise per Session:   Stress:   . Feeling of Stress :   Social Connections:   . Frequency of Communication with Friends and Family:   . Frequency of Social Gatherings with Friends and Family:   . Attends Religious Services:   . Active Member of Clubs  or Organizations:   . Attends Archivist Meetings:   Marland Kitchen Marital Status:   Intimate Partner Violence:   . Fear of Current or Ex-Partner:   . Emotionally Abused:   Marland Kitchen Physically Abused:   . Sexually Abused:  Family History  Adopted: Yes    Vitals:   10/18/19 1107  BP: 128/86  Pulse: 92  SpO2: 97%  Weight: 131.1 kg  Height: 5\' 11"  (1.803 m)     PHYSICAL EXAM: General:  Moderately obese, well appearing. No respiratory difficulty HEENT: normal Neck: supple. no JVD. Carotids 2+ bilat; no bruits. No lymphadenopathy or thyromegaly appreciated. Cor: PMI nondisplaced. Regular rate & rhythm. No rubs, gallops or murmurs. Lungs: clear Abdomen: soft, nontender, nondistended. No hepatosplenomegaly. No bruits or masses. Good bowel sounds. Extremities: no cyanosis, clubbing, rash, edema Neuro: alert & oriented x 3, cranial nerves grossly intact. moves all 4 extremities w/o difficulty. Affect pleasant.  ECG: not performed    ASSESSMENT & PLAN:  1. Chronic Systolic CHF: Nonischemic cardiomyopathy. Echo (6/21) showed EF 35-40%, [facility] 7/21 demonstrated markedly elevated filling pressures and low CI 1.3. Normal coronaries. Suspect hypertensive cardiomyopathy (markedly hypertensive at time of cath requiring NTG gtt). She had marked improvement rapidly with BP lowering and diuresis. Suspect she had flash pulmonary edema in the cath lab due to hypertensive emergency. Required milrinone to help w/ CO and help w/ diuresis.  - Volume status improved. Wt down 10 lb since hospital d/c. Euvolemic on exam. BP stable/ well controlled  - NYHA Class II symptoms - Increase Entresto to 49-51 mg bid - Reduce Lasix from 40 mg bid to once daily  - Continue spironolactone 25 mg daily  - Continue Coreg 6.125 mg bid  - Check BMP today and again in 7 days  - Consider SGLT2i at next f/u visit  2. HTN: - controlled on current regimen - see med titration recommendations above 3. OSA: Continue  CPAP. She reports full compliance  4. Type II diabetes:  - managed by PCP  - will try to initiate SGLT2 inhibitor in near future    F/u w/ pharmD in 2-3 weeks for further med titration. F/u w/ Dr. Aundra Dubin in 6 weeks   Lyda Jester, PA-C 10/18/19

## 2019-10-18 NOTE — Patient Instructions (Signed)
Increase Entresto to 49/ mg twice daily  Decrease lasix to 40mg  daily  Labs today  Follow up with Pharm D in 2-3 weeks  Follow up with Dr.McLean in 6-8 weeks

## 2019-10-18 NOTE — Telephone Encounter (Signed)
Patient was unable to have labs drawn during [place] today Per VO Brittiany Simmons,PA DO NOT proceed with medication changes made at visit until labs are done. Continue entresto 24/26 mg BID Continue lasix 40 BID  Once labs drawn can make increase as instructed    Detailed message left for patient advised to return call for confirmation and lab appt if needed

## 2019-10-22 NOTE — Telephone Encounter (Signed)
[phone] (M) No answer, LMOM

## 2019-10-24 ENCOUNTER — Other Ambulatory Visit (HOSPITAL_COMMUNITY): Payer: 59

## 2019-11-08 NOTE — Telephone Encounter (Signed)
[phone] Alexandria Ruiz) LMOM

## 2019-11-12 ENCOUNTER — Other Ambulatory Visit: Payer: Self-pay | Admitting: Internal Medicine

## 2019-11-14 ENCOUNTER — Inpatient Hospital Stay (HOSPITAL_COMMUNITY)
Admission: RE | Admit: 2019-11-14 | Discharge: 2019-11-14 | Disposition: A | Discharge status: Home / Self Care | Payer: 59 | Source: Ambulatory Visit

## 2019-11-16 NOTE — Telephone Encounter (Signed)
Letter mailed as patient is unable to reach by phone

## 2019-11-28 ENCOUNTER — Encounter (HOSPITAL_COMMUNITY): Payer: Self-pay

## 2019-11-28 ENCOUNTER — Ambulatory Visit (HOSPITAL_COMMUNITY)
Admission: RE | Admit: 2019-11-28 | Discharge: 2019-11-28 | Disposition: A | Discharge status: Home / Self Care | Payer: 59 | Source: Ambulatory Visit | Attending: Cardiology | Admitting: Cardiology

## 2019-11-28 ENCOUNTER — Other Ambulatory Visit: Payer: Self-pay

## 2019-11-28 VITALS — BP 125/75 | HR 93 | Wt 316.0 lb

## 2019-11-28 DIAGNOSIS — G4733 Obstructive sleep apnea (adult) (pediatric): Secondary | ICD-10-CM | POA: Insufficient documentation

## 2019-11-28 DIAGNOSIS — E785 Hyperlipidemia, unspecified: Secondary | ICD-10-CM | POA: Diagnosis not present

## 2019-11-28 DIAGNOSIS — Z87891 Personal history of nicotine dependence: Secondary | ICD-10-CM | POA: Diagnosis not present

## 2019-11-28 DIAGNOSIS — I5042 Chronic combined systolic (congestive) and diastolic (congestive) heart failure: Secondary | ICD-10-CM | POA: Diagnosis not present

## 2019-11-28 DIAGNOSIS — J45909 Unspecified asthma, uncomplicated: Secondary | ICD-10-CM | POA: Diagnosis not present

## 2019-11-28 DIAGNOSIS — I428 Other cardiomyopathies: Secondary | ICD-10-CM | POA: Diagnosis not present

## 2019-11-28 DIAGNOSIS — I110 Hypertensive heart disease with heart failure: Secondary | ICD-10-CM | POA: Diagnosis not present

## 2019-11-28 DIAGNOSIS — M545 Low back pain: Secondary | ICD-10-CM | POA: Diagnosis present

## 2019-11-28 DIAGNOSIS — G8929 Other chronic pain: Secondary | ICD-10-CM | POA: Diagnosis not present

## 2019-11-28 DIAGNOSIS — E119 Type 2 diabetes mellitus without complications: Secondary | ICD-10-CM | POA: Insufficient documentation

## 2019-11-28 DIAGNOSIS — Z7901 Long term (current) use of anticoagulants: Secondary | ICD-10-CM | POA: Diagnosis not present

## 2019-11-28 DIAGNOSIS — Z79899 Other long term (current) drug therapy: Secondary | ICD-10-CM | POA: Insufficient documentation

## 2019-11-28 DIAGNOSIS — Z794 Long term (current) use of insulin: Secondary | ICD-10-CM | POA: Insufficient documentation

## 2019-11-28 LAB — BASIC METABOLIC PANEL
Anion gap: 10 (ref 5–15)
BUN: 18 mg/dL (ref 8–23)
CO2: 24 mmol/L (ref 22–32)
Calcium: 9.1 mg/dL (ref 8.9–10.3)
Chloride: 103 mmol/L (ref 98–111)
Creatinine, Ser: 1.21 mg/dL — ABNORMAL HIGH (ref 0.44–1.00)
GFR calc Af Amer: 56 mL/min — ABNORMAL LOW (ref >60)
GFR calc non Af Amer: 48 mL/min — ABNORMAL LOW (ref >60)
Glucose, Bld: 90 mg/dL (ref 70–99)
Potassium: 4.2 mmol/L (ref 3.5–5.1)
Sodium: 137 mmol/L (ref 135–145)

## 2019-11-28 LAB — BRAIN NATRIURETIC PEPTIDE: B Natriuretic Peptide: 84 pg/mL (ref 0.0–100.0)

## 2019-11-28 MED ORDER — ENTRESTO 49-51 MG PO TABS
1.0000 | ORAL_TABLET | Freq: Two times a day (BID) | ORAL | 3 refills | Status: DC
Start: 2019-11-28 — End: 2020-01-09

## 2019-11-28 MED ORDER — EMPAGLIFLOZIN 10 MG PO TABS
10.0000 mg | ORAL_TABLET | Freq: Every day | ORAL | 6 refills | Status: DC
Start: 2019-11-28 — End: 2020-01-09

## 2019-11-28 NOTE — Patient Instructions (Addendum)
Increase Entresto to 49/51 mg Twice daily   Start Jardiance 10 mg Daily  Labs done today, your results will be available in MyChart, we will contact you for abnormal readings.  Your physician recommends that you return for lab work in: 10-14 days  Your physician recommends that you schedule a follow-up appointment in: 6 weeks with echocardiogram  If you have any questions or concerns before your next appointment please send [place] a message through [place] or call our office at [phone].    TO LEAVE A MESSAGE FOR THE NURSE SELECT OPTION 2, PLEASE LEAVE A MESSAGE INCLUDING: . YOUR NAME . DATE OF BIRTH . CALL BACK NUMBER . REASON FOR CALL**this is important as we prioritize the call backs  Alexandria Ruiz AS LONG AS YOU CALL BEFORE 4:00 PM  At the [facility], you and your health needs are our priority. As part of our continuing mission to provide you with exceptional heart care, we have created designated Provider Care Teams. These Care Teams include your primary Cardiologist (physician) and Advanced Practice Providers (APPs- Physician Assistants and Nurse Practitioners) who all work together to provide you with the care you need, when you need it.   You may see any of the following providers on your designated Care Team at your next follow up: Alexandria Ruiz Kitchen Dr Glori Bickers . Dr Loralie Champagne . Darrick Grinder, NP . Lyda Jester, PA . Audry Riles, PharmD   Please be sure to bring in all your medications bottles to every appointment.

## 2019-11-28 NOTE — Progress Notes (Signed)
Advanced Heart Failure Clinic Note   Referring Physician: PCP: Maury Dus, MD PCP-Cardiologist: Elouise Munroe, MD  HF Cardiology: Dr. Aundra Dubin   HPI: Ms Koop is a 64 year old with history of asthma, HTN, HLD, DMII, OA,  R TKR & LTKR, OSA and recently diagnosed systolic heart failure.  Evaluated by Dr Margaretann Loveless for dyspnea in 2021. Had myoview and ECHO that showed reduced EF, 35-40%. She was set up for  [facility] on 09/27/19 that showed normal cors, markedly elevated filling pressures, and low cardiac output, CI 1.3.  Suspect flash pulmonary edema.  She was hypertensive in the cath lab so Nitroglycerin drip was started. She was admitted, placed on milrinone and diuresed w/ IV Lasix. Cause of cardiomyopathy felt to be hypertension. She was diuresed and started on GDMT. Milrinone weaned off w/ stable Co-ox (73%).  She returns for followup of CHF and HTN.  Weight is up considerably.  She reports eating a lot and not exercising much. She has chronic low back pain and will get an epidural next week. She is short of breath after a "long walk."  BP is controlled today on her medications. She is in NSR, no palpitations.  No orthopnea/PND.  No chest pain.  She is using her CPAP.   Labs (7/21): K 4, creatinine 1.09  ECG: NSR, normal (personally reviewed).   Review of Systems: All systems reviewed and negative except as per HPI.   PMH: 1. Type 2 diabetes.  2. GERD 3. HTN 4. Hyperlipidemia 5. OSA: CPAP.  6. OA with bilateral TKRs.  7. Asthma 8. Chronic systolic CHF: Nonischemic cardiomyopathy.  - Echo (6/21): EF 35-40%, global HK, moderate LV dilation, RV normal,  - LHC/RHC (7/21): No coronary disease.  Mean RA 35, PA 70/44, mean PCWP 46, CI 1.33.  9. Chronic LBP.    Current Outpatient Medications  Medication Sig Dispense Refill   albuterol (PROVENTIL HFA;VENTOLIN HFA) 108 (90 BASE) MCG/ACT inhaler Inhale 2 puffs into the lungs every 6 (six) hours as needed for wheezing.      albuterol (PROVENTIL) (2.5 MG/3ML) 0.083% nebulizer solution Take 2.5 mg by nebulization every 4 (four) hours as needed for wheezing or shortness of breath.      ALPRAZolam (XANAX) 0.5 MG tablet Take 0.25 mg by mouth daily as needed for anxiety.      atorvastatin (LIPITOR) 40 MG tablet Take 40 mg by mouth every Monday, Wednesday, and Friday.      budesonide-formoterol (SYMBICORT) 160-4.5 MCG/ACT inhaler Inhale 2 puffs into the lungs 2 (two) times daily.     carvedilol (COREG) 6.25 MG tablet Take 1 tablet (6.25 mg total) by mouth 2 (two) times daily with a meal. 60 tablet 11   DULoxetine (CYMBALTA) 60 MG capsule Take 60 mg by mouth 2 (two) times daily.     estradiol (ESTRACE) 1 MG tablet Take 1 mg by mouth at bedtime.     furosemide (LASIX) 40 MG tablet Take 1 tablet (40 mg total) by mouth daily. 30 tablet 11   potassium chloride SA (KLOR-CON) 20 MEQ tablet Take 1 tablet (20 mEq total) by mouth daily. 30 tablet 11   spironolactone (ALDACTONE) 25 MG tablet Take 1 tablet (25 mg total) by mouth daily. 30 tablet 11   sucralfate (CARAFATE) 1 g tablet Take 1 g by mouth 4 (four) times daily.     TRULICITY 1.5 SN/0.5LZ SOPN Inject 1.5 mg into the skin every Saturday.   6   valACYclovir (VALTREX) 500 MG tablet Take  500 mg by mouth daily.     empagliflozin (JARDIANCE) 10 MG TABS tablet Take 1 tablet (10 mg total) by mouth daily before breakfast. 30 tablet 6   sacubitril-valsartan (ENTRESTO) 49-51 MG Take 1 tablet by mouth 2 (two) times daily. 60 tablet 3   No current facility-administered medications for this encounter.    Allergies  Allergen Reactions   Hydrocodone Itching   Oxycodone Itching      Social History   Socioeconomic History   Marital status: Married    Spouse name: Not on file   Number of children: Not on file   Years of education: Not on file   Highest education level: Not on file  Occupational History   Not on file  Tobacco Use   Smoking status: Former  Smoker    Packs/day: 0.50    Years: 30.00    Pack years: 15.00    Quit date: 10/29/2006    Years since quitting: 13.0   Smokeless tobacco: Never Used  Vaping Use   Vaping Use: Never used  Substance and Sexual Activity   Alcohol use: No   Drug use: No   Sexual activity: Not Currently  Other Topics Concern   Not on file  Social History Narrative   Not on file   Social Determinants of Health   Financial Resource Strain:    Difficulty of Paying Living Expenses: Not on file  Food Insecurity:    Worried About Charity fundraiser in the Last Year: Not on file   [employer] of Food in the Last Year: Not on file  Transportation Needs:    Lack of Transportation (Medical): Not on file   Lack of Transportation (Non-Medical): Not on file  Physical Activity:    Days of Exercise per Week: Not on file   Minutes of Exercise per Session: Not on file  Stress:    Feeling of Stress : Not on file  Social Connections:    Frequency of Communication with Friends and Family: Not on file   Frequency of Social Gatherings with Friends and Family: Not on file   Attends Religious Services: Not on file   Active Member of Clubs or Organizations: Not on file   Attends Archivist Meetings: Not on file   Marital Status: Not on file  Intimate Partner Violence:    Fear of Current or Ex-Partner: Not on file   Emotionally Abused: Not on file   Physically Abused: Not on file   Sexually Abused: Not on file      Family History  Adopted: Yes    Vitals:   11/28/19 0839  BP: 125/75  Pulse: 93  SpO2: 98%  Weight: (!) 143.3 kg (316 lb)     PHYSICAL EXAM: General: NAD Neck: JVP 8 cm, no thyromegaly or thyroid nodule.  Lungs: Clear to auscultation bilaterally with normal respiratory effort. CV: Nondisplaced PMI.  Heart regular S1/S2, no S3/S4, no murmur.  Trace ankle edema.  No carotid bruit.  Normal pedal pulses.  Abdomen: Soft, nontender, no hepatosplenomegaly, no  distention.  Skin: Intact without lesions or rashes.  Neurologic: Alert and oriented x 3.  Psych: Normal affect. Extremities: No clubbing or cyanosis.  HEENT: Normal.   ASSESSMENT & PLAN:  1. Chronic Systolic CHF: Nonischemic cardiomyopathy. Echo (6/21) showed EF 35-40%, [facility] 7/21 demonstrated markedly elevated filling pressures and low CI 1.3. Normal coronaries. Suspect hypertensive cardiomyopathy (markedly hypertensive at time of cath requiring NTG gtt). She had marked improvement rapidly with  BP lowering and diuresis. Suspect she had flash pulmonary edema in the cath lab due to hypertensive emergency. Required milrinone to help w/ CO and help w/ diuresis.  Stable now, NYHA class II symptoms. Weight is up but only mildly volume overloaded on exam.  - Increase Entresto to 49/51 bid. BMET/BNP today and BMET in 10 days.  - Continue Lasix 40 mg daily.  - Continue spironolactone 25 mg daily  - Continue Coreg 6.25 mg bid  - Add Farxiga 10 mg daily.   - Repeat echo in 6 wks at followup.  2. HTN: Controlled on current meds.  3. OSA: Continue CPAP. She reports full compliance  4. Type II diabetes:  - Adding Farxiga.   Followup with me in 6 wks with echo.   Loralie Champagne, MD 11/28/19

## 2019-12-05 ENCOUNTER — Encounter (HOSPITAL_COMMUNITY): Payer: Self-pay

## 2019-12-06 NOTE — Addendum Note (Signed)
Encounter addended by: Orma Render, RPH-CPP on: 12/06/2019 3:41 PM  Actions taken: Clinical Note Signed, Delete clinical note

## 2019-12-10 ENCOUNTER — Other Ambulatory Visit (HOSPITAL_COMMUNITY): Payer: 59

## 2019-12-12 ENCOUNTER — Encounter (HOSPITAL_COMMUNITY): Payer: Self-pay

## 2019-12-12 ENCOUNTER — Other Ambulatory Visit: Payer: Self-pay

## 2019-12-12 ENCOUNTER — Ambulatory Visit (HOSPITAL_COMMUNITY)
Admission: RE | Admit: 2019-12-12 | Discharge: 2019-12-12 | Disposition: A | Discharge status: Home / Self Care | Payer: 59 | Source: Ambulatory Visit | Attending: Internal Medicine | Admitting: Internal Medicine

## 2019-12-12 DIAGNOSIS — I5042 Chronic combined systolic (congestive) and diastolic (congestive) heart failure: Secondary | ICD-10-CM

## 2019-12-12 LAB — BASIC METABOLIC PANEL
Anion gap: 13 (ref 5–15)
BUN: 18 mg/dL (ref 8–23)
CO2: 24 mmol/L (ref 22–32)
Calcium: 9.1 mg/dL (ref 8.9–10.3)
Chloride: 100 mmol/L (ref 98–111)
Creatinine, Ser: 1.09 mg/dL — ABNORMAL HIGH (ref 0.44–1.00)
GFR calc Af Amer: >60 mL/min (ref >60)
GFR calc non Af Amer: 54 mL/min — ABNORMAL LOW (ref >60)
Glucose, Bld: 140 mg/dL — ABNORMAL HIGH (ref 70–99)
Potassium: 4.2 mmol/L (ref 3.5–5.1)
Sodium: 137 mmol/L (ref 135–145)

## 2020-01-09 ENCOUNTER — Encounter (HOSPITAL_COMMUNITY): Payer: Self-pay | Admitting: Cardiology

## 2020-01-09 ENCOUNTER — Ambulatory Visit (HOSPITAL_BASED_OUTPATIENT_CLINIC_OR_DEPARTMENT_OTHER)
Admission: RE | Admit: 2020-01-09 | Discharge: 2020-01-09 | Disposition: A | Discharge status: Home / Self Care | Payer: 59 | Source: Ambulatory Visit | Attending: Cardiology | Admitting: Cardiology

## 2020-01-09 ENCOUNTER — Ambulatory Visit (HOSPITAL_COMMUNITY)
Admission: RE | Admit: 2020-01-09 | Discharge: 2020-01-09 | Disposition: A | Discharge status: Home / Self Care | Payer: 59 | Source: Ambulatory Visit | Attending: Cardiology | Admitting: Cardiology

## 2020-01-09 ENCOUNTER — Other Ambulatory Visit (HOSPITAL_COMMUNITY): Payer: 59

## 2020-01-09 ENCOUNTER — Other Ambulatory Visit: Payer: Self-pay

## 2020-01-09 VITALS — BP 118/70 | HR 86 | Wt 323.4 lb

## 2020-01-09 DIAGNOSIS — I5022 Chronic systolic (congestive) heart failure: Secondary | ICD-10-CM | POA: Insufficient documentation

## 2020-01-09 DIAGNOSIS — I5042 Chronic combined systolic (congestive) and diastolic (congestive) heart failure: Secondary | ICD-10-CM

## 2020-01-09 DIAGNOSIS — K219 Gastro-esophageal reflux disease without esophagitis: Secondary | ICD-10-CM | POA: Insufficient documentation

## 2020-01-09 DIAGNOSIS — E669 Obesity, unspecified: Secondary | ICD-10-CM | POA: Diagnosis not present

## 2020-01-09 DIAGNOSIS — Z79899 Other long term (current) drug therapy: Secondary | ICD-10-CM | POA: Insufficient documentation

## 2020-01-09 DIAGNOSIS — Z7984 Long term (current) use of oral hypoglycemic drugs: Secondary | ICD-10-CM | POA: Insufficient documentation

## 2020-01-09 DIAGNOSIS — Z87891 Personal history of nicotine dependence: Secondary | ICD-10-CM | POA: Insufficient documentation

## 2020-01-09 DIAGNOSIS — G4733 Obstructive sleep apnea (adult) (pediatric): Secondary | ICD-10-CM | POA: Diagnosis not present

## 2020-01-09 DIAGNOSIS — I110 Hypertensive heart disease with heart failure: Secondary | ICD-10-CM | POA: Insufficient documentation

## 2020-01-09 DIAGNOSIS — E785 Hyperlipidemia, unspecified: Secondary | ICD-10-CM | POA: Insufficient documentation

## 2020-01-09 DIAGNOSIS — I428 Other cardiomyopathies: Secondary | ICD-10-CM | POA: Diagnosis not present

## 2020-01-09 DIAGNOSIS — E119 Type 2 diabetes mellitus without complications: Secondary | ICD-10-CM | POA: Insufficient documentation

## 2020-01-09 LAB — BASIC METABOLIC PANEL
Anion gap: 11 (ref 5–15)
BUN: 17 mg/dL (ref 8–23)
CO2: 24 mmol/L (ref 22–32)
Calcium: 9.2 mg/dL (ref 8.9–10.3)
Chloride: 102 mmol/L (ref 98–111)
Creatinine, Ser: 1.17 mg/dL — ABNORMAL HIGH (ref 0.44–1.00)
GFR, Estimated: 53 mL/min — ABNORMAL LOW (ref >60)
Glucose, Bld: 92 mg/dL (ref 70–99)
Potassium: 4 mmol/L (ref 3.5–5.1)
Sodium: 137 mmol/L (ref 135–145)

## 2020-01-09 LAB — ECHOCARDIOGRAM COMPLETE
Area-P 1/2: 3.37 cm2
S' Lateral: 4.1 cm

## 2020-01-09 MED ORDER — FUROSEMIDE 40 MG PO TABS: 40.0000 mg | ORAL_TABLET | Freq: Every day | ORAL | 11 refills | Status: DC

## 2020-01-09 MED ORDER — POTASSIUM CHLORIDE CRYS ER 20 MEQ PO TBCR: 20.0000 meq | EXTENDED_RELEASE_TABLET | Freq: Every day | ORAL | 11 refills | Status: DC

## 2020-01-09 MED ORDER — ENTRESTO 49-51 MG PO TABS: 1.0000 | ORAL_TABLET | Freq: Two times a day (BID) | ORAL | 11 refills | Status: DC

## 2020-01-09 MED ORDER — SPIRONOLACTONE 25 MG PO TABS: 25.0000 mg | ORAL_TABLET | Freq: Every day | ORAL | 11 refills | Status: DC

## 2020-01-09 MED ORDER — CARVEDILOL 6.25 MG PO TABS: 6.2500 mg | ORAL_TABLET | Freq: Two times a day (BID) | ORAL | 11 refills | Status: DC

## 2020-01-09 MED ORDER — EMPAGLIFLOZIN 10 MG PO TABS: 10.0000 mg | ORAL_TABLET | Freq: Every day | ORAL | 11 refills | Status: DC

## 2020-01-09 NOTE — Patient Instructions (Signed)
Labs done today, your results will be available in MyChart, we will contact you for abnormal readings.  Please call our office in March 2022 to schedule your follow up appointment  If you have any questions or concerns before your next appointment please send [place] a message through [place] or call our office at [phone].    TO LEAVE A MESSAGE FOR THE NURSE SELECT OPTION 2, PLEASE LEAVE A MESSAGE INCLUDING: . YOUR NAME . DATE OF BIRTH . CALL BACK NUMBER . REASON FOR CALL**this is important as we prioritize the call backs  [place] AS LONG AS YOU CALL BEFORE 4:00 PM  At the [facility], you and your health needs are our priority. As part of our continuing mission to provide you with exceptional heart care, we have created designated Provider Care Teams. These Care Teams include your primary Cardiologist (physician) and Advanced Practice Providers (APPs- Physician Assistants and Nurse Practitioners) who all work together to provide you with the care you need, when you need it.   You may see any of the following providers on your designated Care Team at your next follow up: Marland Kitchen Dr Glori Bickers . Dr Loralie Champagne . Darrick Grinder, NP . Lyda Jester, PA . Audry Riles, PharmD   Please be sure to bring in all your medications bottles to every appointment.

## 2020-01-09 NOTE — Progress Notes (Signed)
  Echocardiogram 2D Echocardiogram has been performed.  Alexandria Ruiz 01/09/2020, 3:27 PM

## 2020-01-10 NOTE — Progress Notes (Signed)
Advanced Heart Failure Clinic Note   Referring Physician: PCP: Maury Dus, MD PCP-Cardiologist: Elouise Munroe, MD  HF Cardiology: Dr. Aundra Dubin   HPI: Alexandria Ruiz is a 64 year old with history of asthma, HTN, HLD, DMII, OA,  R TKR & LTKR, OSA and recently diagnosed systolic heart failure.  Evaluated by Dr Margaretann Loveless for dyspnea in 2021. Had myoview and ECHO that showed reduced EF, 35-40%. She was set up for  [facility] on 09/27/19 that showed normal cors, markedly elevated filling pressures, and low cardiac output, CI 1.3.  Suspect flash pulmonary edema.  She was hypertensive in the cath lab so Nitroglycerin drip was started. She was admitted, placed on milrinone and diuresed w/ IV Lasix. Cause of cardiomyopathy felt to be hypertension. She was diuresed and started on GDMT. Milrinone weaned off w/ stable Co-ox (73%).  Echo was done today and reviewed, EF up to 50%, mild LVH, mildly decreased RV systolic function, mild MR.   She returns for followup of CHF and HTN.  Weight is up about 7 lbs.  She is not getting much exercise due to low back pain.  Some wheezing attributed to asthma.  Generally, no exertional dyspnea or chest pain though she has not been particularly active.  No lightheadedness.  No orthopnea/PND.  She is using CPAP.   Labs (7/21): K 4, creatinine 1.09 Labs (9/21): K 4.2, creatinine 1.09  Review of Systems: All systems reviewed and negative except as per HPI.   PMH: 1. Type 2 diabetes.  2. GERD 3. HTN 4. Hyperlipidemia 5. OSA: CPAP.  6. OA with bilateral TKRs.  7. Asthma 8. Chronic systolic CHF: Nonischemic cardiomyopathy.  - Echo (6/21): EF 35-40%, global HK, moderate LV dilation, RV normal,  - LHC/RHC (7/21): No coronary disease.  Mean RA 35, PA 70/44, mean PCWP 46, CI 1.33.  - Echo (10/21): EF up to 50%, mild LVH, mildly decreased RV systolic function, mild MR. 9. Chronic LBP.    Current Outpatient Medications  Medication Sig Dispense Refill  . albuterol  (PROVENTIL HFA;VENTOLIN HFA) 108 (90 BASE) MCG/ACT inhaler Inhale 2 puffs into the lungs every 6 (six) hours as needed for wheezing.    Marland Kitchen albuterol (PROVENTIL) (2.5 MG/3ML) 0.083% nebulizer solution Take 2.5 mg by nebulization every 4 (four) hours as needed for wheezing or shortness of breath.     . ALPRAZolam (XANAX) 0.5 MG tablet Take 0.25 mg by mouth daily as needed for anxiety.     Marland Kitchen atorvastatin (LIPITOR) 40 MG tablet Take 40 mg by mouth every Monday, Wednesday, and Friday.     . budesonide-formoterol (SYMBICORT) 160-4.5 MCG/ACT inhaler Inhale 2 puffs into the lungs 2 (two) times daily.    . carvedilol (COREG) 6.25 MG tablet Take 1 tablet (6.25 mg total) by mouth 2 (two) times daily with a meal. 60 tablet 11  . DULoxetine (CYMBALTA) 60 MG capsule Take 60 mg by mouth 2 (two) times daily.    . empagliflozin (JARDIANCE) 10 MG TABS tablet Take 1 tablet (10 mg total) by mouth daily before breakfast. 30 tablet 11  . estradiol (ESTRACE) 1 MG tablet Take 1 mg by mouth at bedtime.    . furosemide (LASIX) 40 MG tablet Take 1 tablet (40 mg total) by mouth daily. 30 tablet 11  . potassium chloride SA (KLOR-CON) 20 MEQ tablet Take 1 tablet (20 mEq total) by mouth daily. 30 tablet 11  . sacubitril-valsartan (ENTRESTO) 49-51 MG Take 1 tablet by mouth 2 (two) times daily. 60 tablet  11  . spironolactone (ALDACTONE) 25 MG tablet Take 1 tablet (25 mg total) by mouth daily. 30 tablet 11  . sucralfate (CARAFATE) 1 g tablet Take 1 g by mouth 4 (four) times daily.    . TRULICITY 1.5 VQ/0.0QQ SOPN Inject 1.5 mg into the skin every Saturday.   6  . valACYclovir (VALTREX) 500 MG tablet Take 500 mg by mouth daily.     No current facility-administered medications for this encounter.    Allergies  Allergen Reactions  . Hydrocodone Itching  . Oxycodone Itching      Social History   Socioeconomic History  . Marital status: Married    Spouse name: Not on file  . Number of children: Not on file  . Years of  education: Not on file  . Highest education level: Not on file  Occupational History  . Not on file  Tobacco Use  . Smoking status: Former Smoker    Packs/day: 0.50    Years: 30.00    Pack years: 15.00    Quit date: 10/29/2006    Years since quitting: 13.2  . Smokeless tobacco: Never Used  Vaping Use  . Vaping Use: Never used  Substance and Sexual Activity  . Alcohol use: No  . Drug use: No  . Sexual activity: Not Currently  Other Topics Concern  . Not on file  Social History Narrative  . Not on file   Social Determinants of Health   Financial Resource Strain:   . Difficulty of Paying Living Expenses: Not on file  Food Insecurity:   . Worried About Charity fundraiser in the Last Year: Not on file  . Ran Out of Food in the Last Year: Not on file  Transportation Needs:   . Lack of Transportation (Medical): Not on file  . Lack of Transportation (Non-Medical): Not on file  Physical Activity:   . Days of Exercise per Week: Not on file  . Minutes of Exercise per Session: Not on file  Stress:   . Feeling of Stress : Not on file  Social Connections:   . Frequency of Communication with Friends and Family: Not on file  . Frequency of Social Gatherings with Friends and Family: Not on file  . Attends Religious Services: Not on file  . Active Member of Clubs or Organizations: Not on file  . Attends Archivist Meetings: Not on file  . Marital Status: Not on file  Intimate Partner Violence:   . Fear of Current or Ex-Partner: Not on file  . Emotionally Abused: Not on file  . Physically Abused: Not on file  . Sexually Abused: Not on file      Family History  Adopted: Yes    Vitals:   01/09/20 1514  BP: 118/70  Pulse: 86  SpO2: 97%  Weight: (!) 146.7 kg (323 lb 6.4 oz)     PHYSICAL EXAM: General: NAD, obese Neck: No JVD, no thyromegaly or thyroid nodule.  Lungs: Clear to auscultation bilaterally with normal respiratory effort. CV: Nondisplaced PMI.  Heart  regular S1/S2, no S3/S4, no murmur.  No peripheral edema.  No carotid bruit.  Normal pedal pulses.  Abdomen: Soft, nontender, no hepatosplenomegaly, no distention.  Skin: Intact without lesions or rashes.  Neurologic: Alert and oriented x 3.  Psych: Normal affect. Extremities: No clubbing or cyanosis.  HEENT: Normal.   ASSESSMENT & PLAN:  1. Chronic Systolic CHF: Nonischemic cardiomyopathy. Echo (6/21) showed EF 35-40%, R/LHC 7/21 demonstrated markedly elevated filling  pressures and low CI 1.3. Normal coronaries. Suspect hypertensive cardiomyopathy (markedly hypertensive at time of cath requiring NTG gtt). She had marked improvement rapidly with BP lowering and diuresis. Suspect she had flash pulmonary edema in the cath lab due to hypertensive emergency. Required milrinone to help w/ CO and help w/ diuresis. Repeat echo today showed EF up to 50%.  Symptomatically improved, NYHA class II symptoms. Weight is up but not volume overloaded on exam.  - Continue Entresto 49/51 bid. BMET today.  - Continue Lasix 40 mg daily.  - Continue spironolactone 25 mg daily  - Continue Coreg 6.25 mg bid  - Continue empagliflozin 10 mg daily.   - She is out of ICD range.  2. HTN: Controlled on current meds.  3. OSA: Continue CPAP. She reports full compliance  4. Type II diabetes:  - Continue empagliflozin.   5. Obesity: Work on diet/exercise for weight loss.  This would likely help her chronic back pain.   Followup in 6 months.  Loralie Champagne, MD 01/10/20

## 2020-01-21 ENCOUNTER — Ambulatory Visit: Payer: 59 | Admitting: Internal Medicine

## 2020-06-01 IMAGING — CT CT ANGIO CHEST
2 of 6 series · 19 of 46 positions shown · IV contrast (omnipaque)
Comparison: Chest radiography same day.  CT angiography 01/16/2016.

CLINICAL DATA: Shortness of breath.  Pulmonary embolism suspected.

EXAM:
CT ANGIOGRAPHY CHEST WITH CONTRAST
TECHNIQUE: Multidetector CT imaging of the chest was performed using the
standard protocol during bolus administration of intravenous
contrast. Multiplanar CT image reconstructions and MIPs were
obtained to evaluate the vascular anatomy.
CONTRAST:  100mL OMNIPAQUE IOHEXOL 350 MG/ML SOLN

[Series 6: thins · axial · 0.68mm/px · z∈[-344,-65]mm · 17 of 307 slices shown]
[im 14/307  lung]
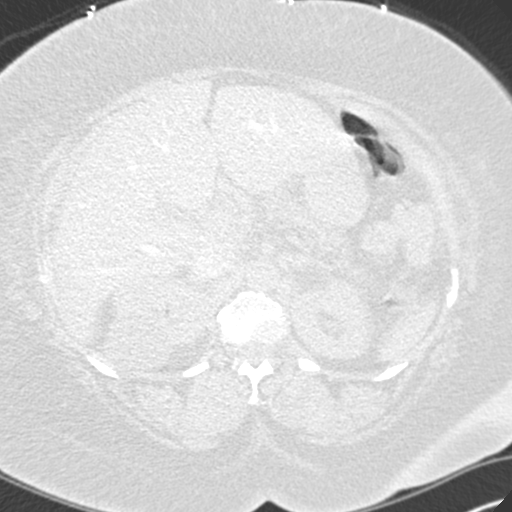
[im 27/307  soft-tissue]
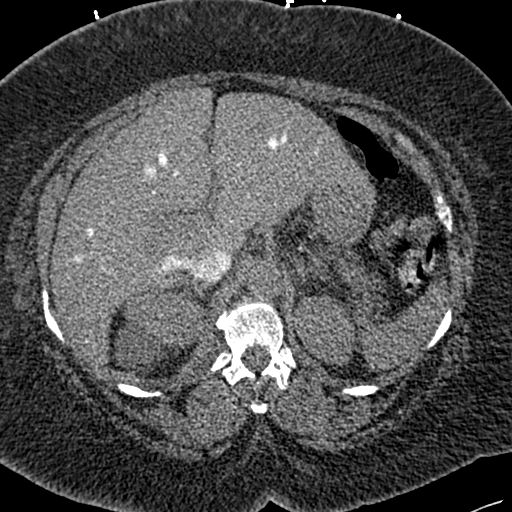
[im 54/307  lung]
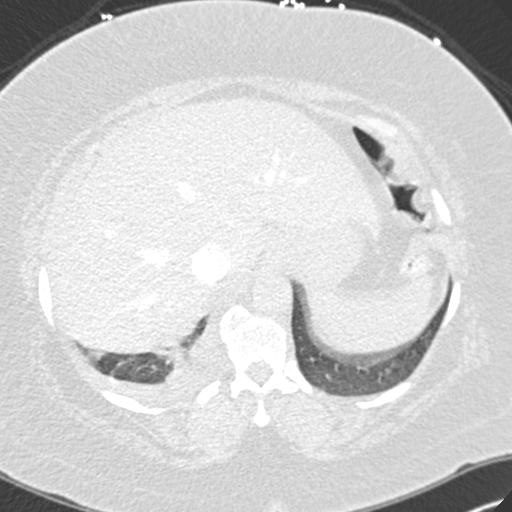
[im 67/307  soft-tissue]
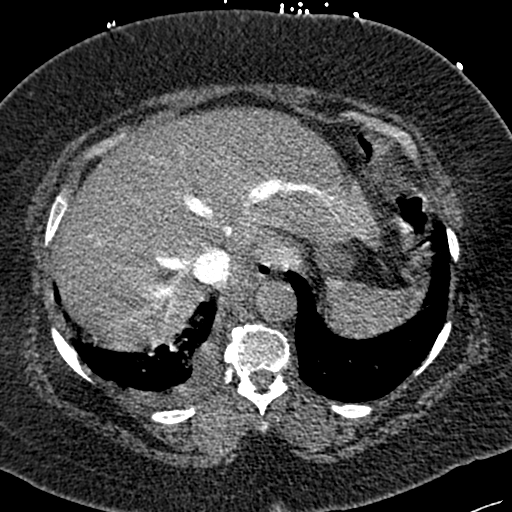
[im 80/307  lung]
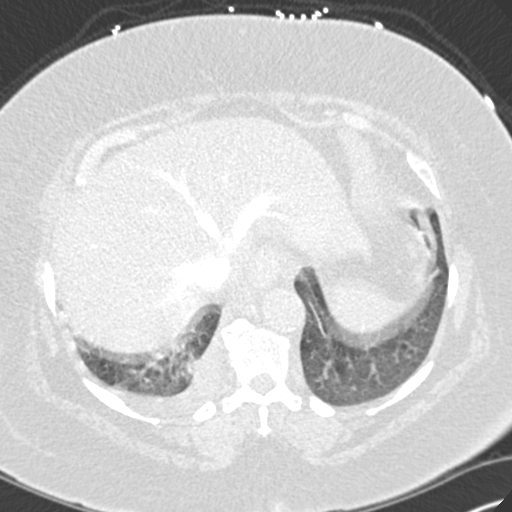
[im 107/307  soft-tissue]
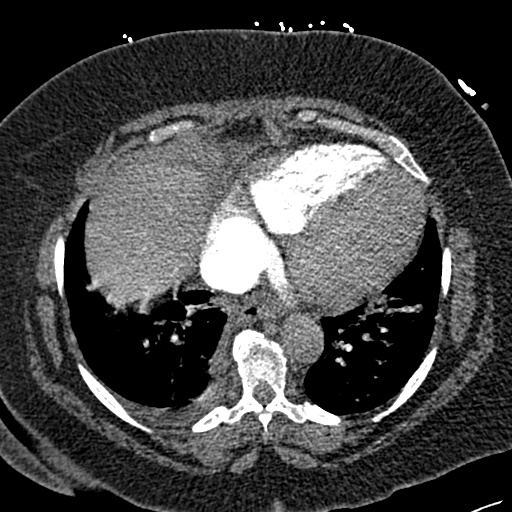
[im 120/307  lung]
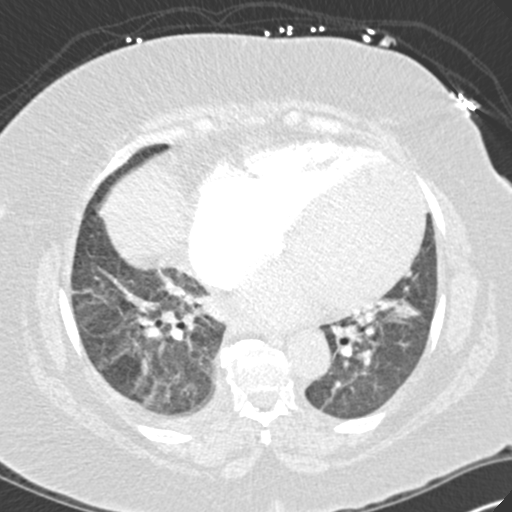
[im 134/307  soft-tissue]
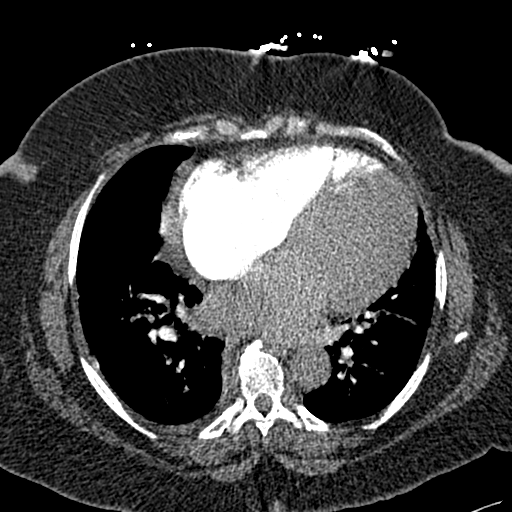
[im 160/307  lung]
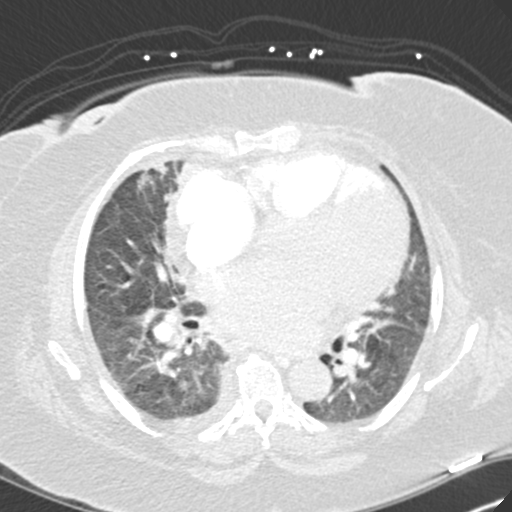
[im 173/307  soft-tissue]
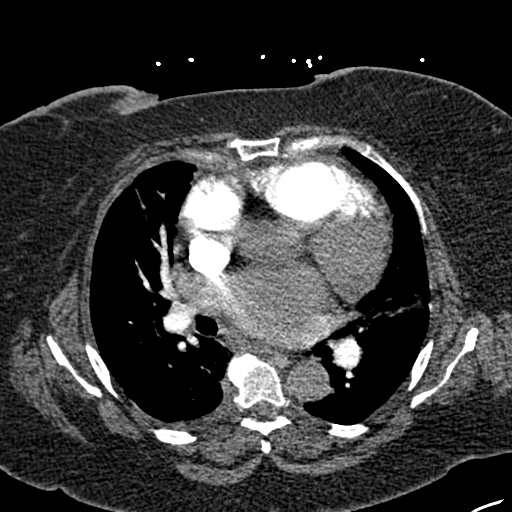
[im 187/307  lung]
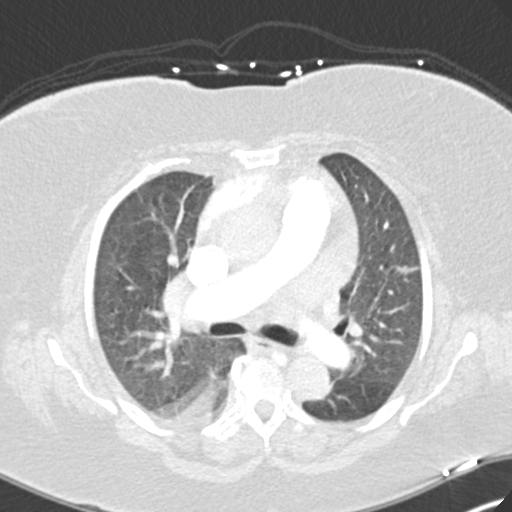
[im 200/307  soft-tissue]
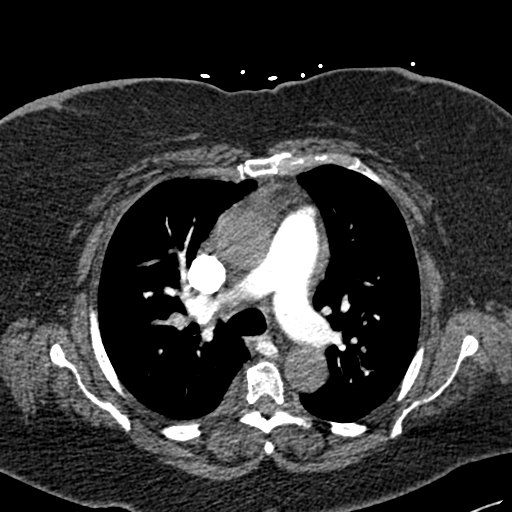
[im 227/307  lung]
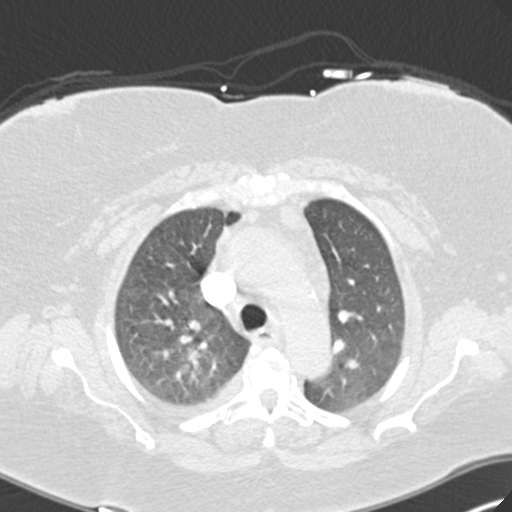
[im 240/307  soft-tissue]
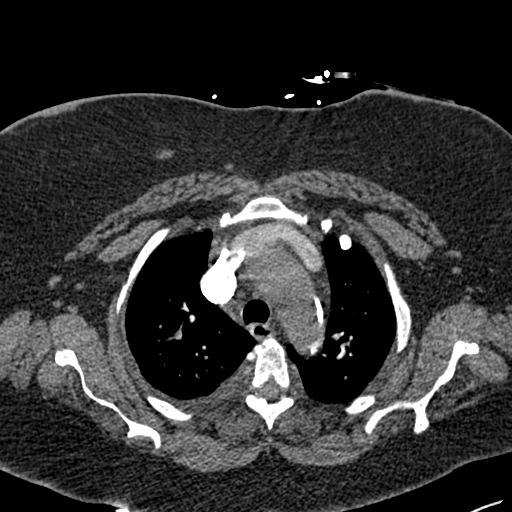
[im 253/307  lung]
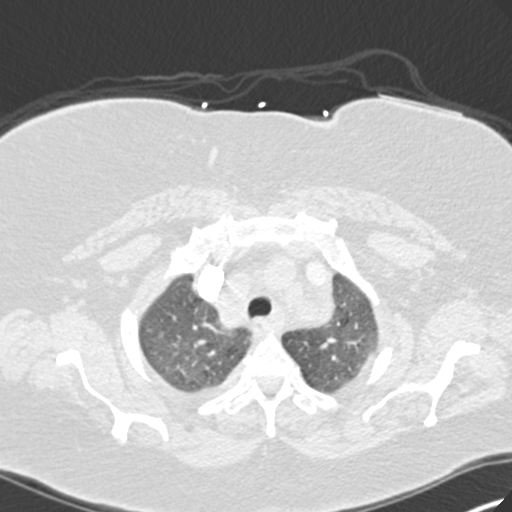
[im 280/307  soft-tissue]
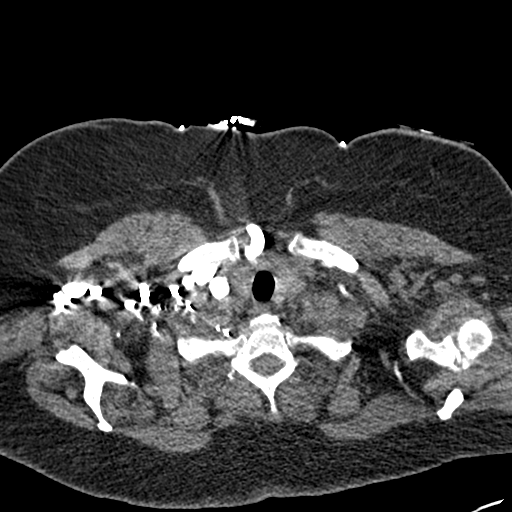
[im 293/307  lung]
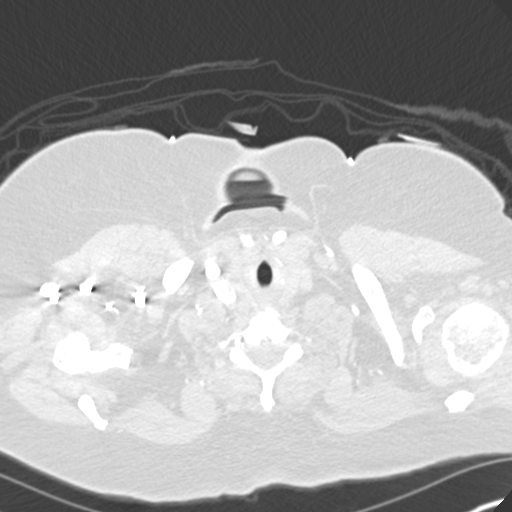

[Series 8: coronal mpr · coronal · 0.61mm/px · 2 of 94 slices shown]
[im 32/94  soft-tissue]
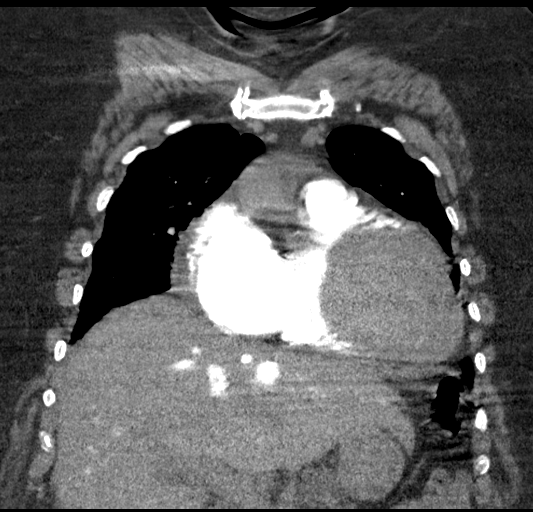
[im 63/94  soft-tissue]
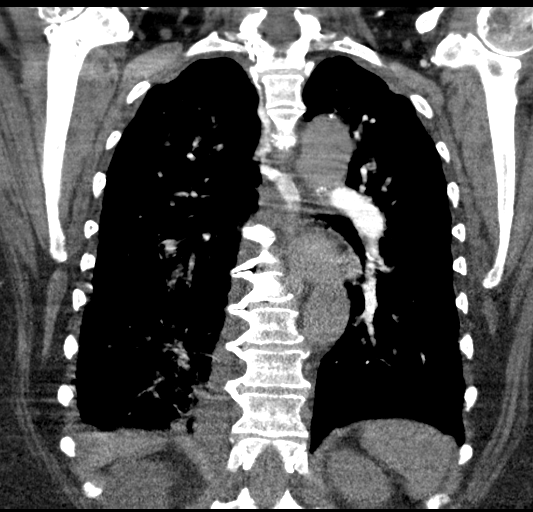

[19 of 46 positions shown; findings below may reference images not displayed]

FINDINGS: Cardiovascular: Pulmonary arterial opacification is moderate in the
upper lobes and good in the lower lobes, based on early bolus
timing. I do not see any pulmonary emboli. Think the study
affectively excludes large central emboli. Heart is enlarged. No
pericardial effusion. There is aortic atherosclerosis.

Mediastinum/Nodes: No mass or lymphadenopathy.

Lungs/Pleura: Small pleural effusion on the right. Mild atelectasis
or infiltrate in the right middle lobe. Scattered areas of
atelectasis or scarring in the lower lobes.

Upper Abdomen: Right renal cyst

Musculoskeletal: Ordinary thoracic degenerative changes.

Review of the MIP images confirms the above findings.
IMPRESSION: Small right effusion with dependent pulmonary atelectasis on the
right. Cardiomegaly.

No visible pulmonary emboli. Early contrast bolus timing. I think
this study excludes large central emboli. I do not see positive
evidence of a small peripheral embolus.

Newly seen atelectasis/infiltrate in the right middle lobe.

Linear atelectasis or scarring in the lower lobes, slightly more
prominent than noted in 6937.

Aortic Atherosclerosis ([id number]).

## 2020-06-01 IMAGING — DX DG CHEST 2V
2 series · 2 of 2 positions shown · non-contrast
Comparison: 04/16/2019

CLINICAL DATA: Short of breath and fatigue.

EXAM:
CHEST - 2 VIEW

[chest pa]
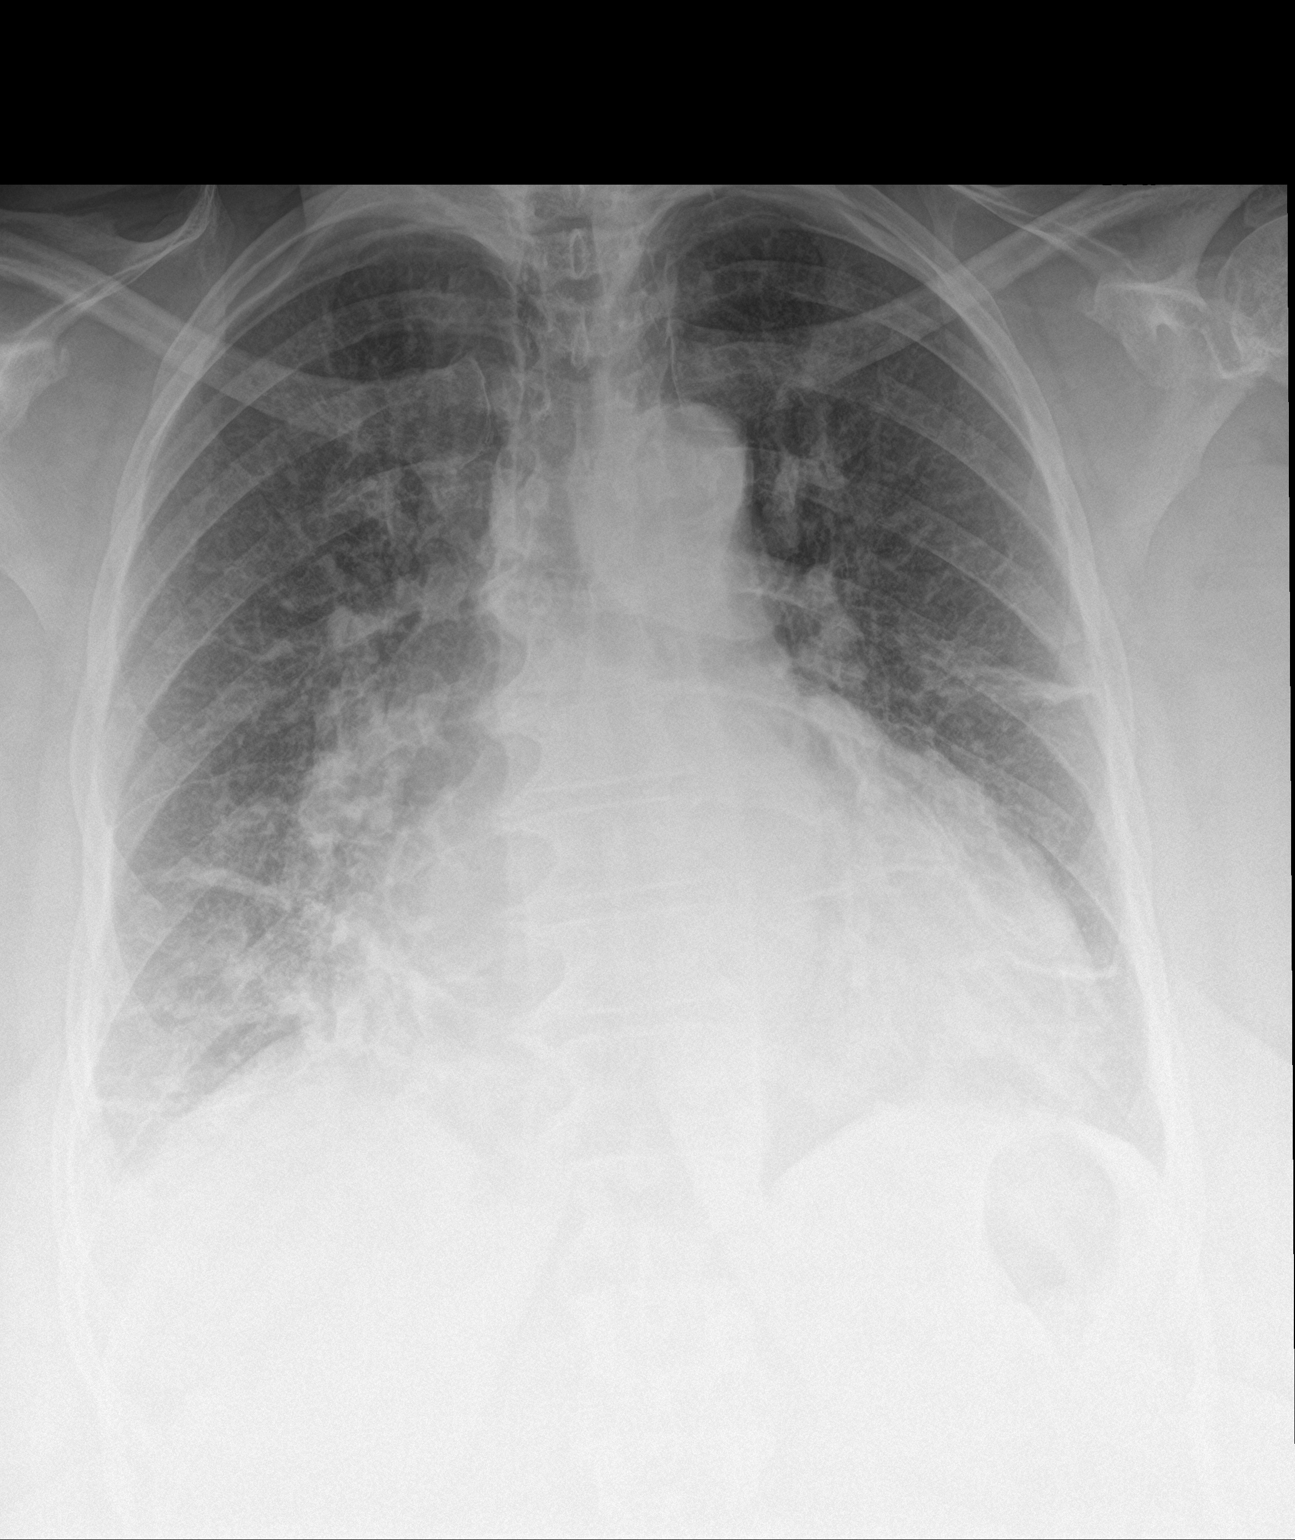

[chest lat]
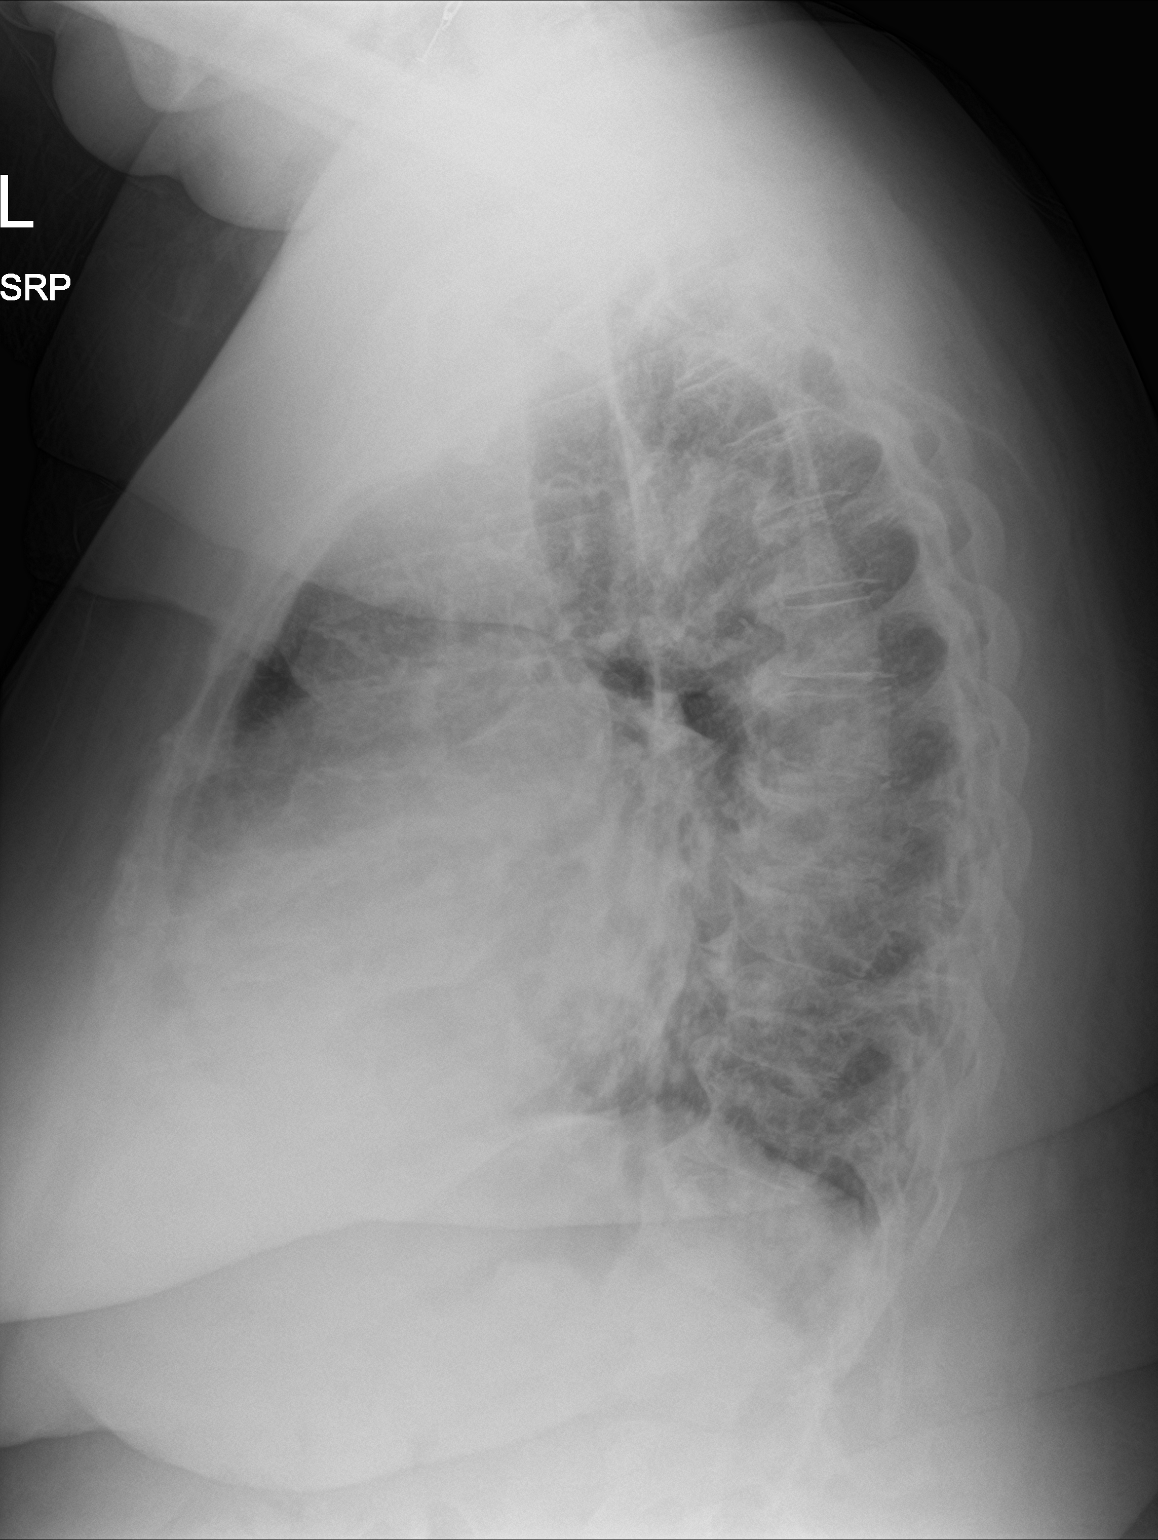

[2 of 2 positions shown; findings below may reference images not displayed]

FINDINGS: Aortic atherosclerosis. Moderate cardiac enlargement is identified.
Linear opacities within both lower lung zones and left midlung are
again noted compatible with scarring and platelike atelectasis.
Persistent opacity within the anterior right lower lobe is
identified.
IMPRESSION: 1. Persistent anterior right lower lobe opacity is noted. Findings
are indeterminate. If the patient remains symptomatic recommend
further evaluation with CT of the chest to assess for underlying
lung lesion.
2. Cardiac enlargement
3.  Aortic Atherosclerosis ([id number].R).

## 2020-11-13 ENCOUNTER — Encounter (HOSPITAL_COMMUNITY): Payer: Self-pay

## 2020-11-13 ENCOUNTER — Encounter (HOSPITAL_COMMUNITY): Payer: Self-pay | Admitting: Cardiology

## 2020-11-13 ENCOUNTER — Other Ambulatory Visit: Payer: Self-pay

## 2020-11-13 ENCOUNTER — Ambulatory Visit (HOSPITAL_COMMUNITY)
Admission: RE | Admit: 2020-11-13 | Discharge: 2020-11-13 | Disposition: A | Discharge status: Home / Self Care | Payer: 59 | Source: Ambulatory Visit | Attending: Cardiology | Admitting: Cardiology

## 2020-11-13 VITALS — BP 118/70 | HR 98 | Wt 344.0 lb

## 2020-11-13 DIAGNOSIS — E669 Obesity, unspecified: Secondary | ICD-10-CM | POA: Diagnosis not present

## 2020-11-13 DIAGNOSIS — E119 Type 2 diabetes mellitus without complications: Secondary | ICD-10-CM | POA: Diagnosis not present

## 2020-11-13 DIAGNOSIS — G4733 Obstructive sleep apnea (adult) (pediatric): Secondary | ICD-10-CM | POA: Diagnosis not present

## 2020-11-13 DIAGNOSIS — I5022 Chronic systolic (congestive) heart failure: Secondary | ICD-10-CM | POA: Diagnosis not present

## 2020-11-13 DIAGNOSIS — J45909 Unspecified asthma, uncomplicated: Secondary | ICD-10-CM | POA: Diagnosis not present

## 2020-11-13 DIAGNOSIS — Z7951 Long term (current) use of inhaled steroids: Secondary | ICD-10-CM | POA: Diagnosis not present

## 2020-11-13 DIAGNOSIS — Z885 Allergy status to narcotic agent status: Secondary | ICD-10-CM | POA: Insufficient documentation

## 2020-11-13 DIAGNOSIS — I5042 Chronic combined systolic (congestive) and diastolic (congestive) heart failure: Secondary | ICD-10-CM

## 2020-11-13 DIAGNOSIS — E785 Hyperlipidemia, unspecified: Secondary | ICD-10-CM | POA: Diagnosis not present

## 2020-11-13 DIAGNOSIS — Z79899 Other long term (current) drug therapy: Secondary | ICD-10-CM | POA: Diagnosis not present

## 2020-11-13 DIAGNOSIS — I110 Hypertensive heart disease with heart failure: Secondary | ICD-10-CM | POA: Insufficient documentation

## 2020-11-13 DIAGNOSIS — I428 Other cardiomyopathies: Secondary | ICD-10-CM | POA: Diagnosis not present

## 2020-11-13 DIAGNOSIS — Z87891 Personal history of nicotine dependence: Secondary | ICD-10-CM | POA: Diagnosis not present

## 2020-11-13 LAB — BRAIN NATRIURETIC PEPTIDE: B Natriuretic Peptide: 11 pg/mL (ref 0.0–100.0)

## 2020-11-13 LAB — BASIC METABOLIC PANEL
Anion gap: 8 (ref 5–15)
BUN: 15 mg/dL (ref 8–23)
CO2: 26 mmol/L (ref 22–32)
Calcium: 9.2 mg/dL (ref 8.9–10.3)
Chloride: 102 mmol/L (ref 98–111)
Creatinine, Ser: 1.03 mg/dL — ABNORMAL HIGH (ref 0.44–1.00)
GFR, Estimated: >60 mL/min (ref >60)
Glucose, Bld: 157 mg/dL — ABNORMAL HIGH (ref 70–99)
Potassium: 4.3 mmol/L (ref 3.5–5.1)
Sodium: 136 mmol/L (ref 135–145)

## 2020-11-13 MED ORDER — FUROSEMIDE 40 MG PO TABS: 60.0000 mg | ORAL_TABLET | Freq: Every day | ORAL | 3 refills | Status: DC

## 2020-11-13 MED ORDER — POTASSIUM CHLORIDE CRYS ER 20 MEQ PO TBCR: 30.0000 meq | EXTENDED_RELEASE_TABLET | Freq: Every day | ORAL | 3 refills | Status: DC

## 2020-11-13 NOTE — Patient Instructions (Addendum)
Labs done today. We will contact you only if your labs are abnormal.  INCREASE Lasix to '60mg'$  (1 & 1/2 tablets) by mouth daily.  INCREASE Potassium to '30mg'$  (1 & 1/2 tablets) by mouth daily.   No other medication changes were made. Please continue all current medications as prescribed.  You have been referred to the Pharmacy Clinic for [facility]. They will contact you to schedule an appointment .   Your physician recommends that you schedule a follow-up appointment in: 10 days for a lab only appointment and in 1 month with our APP Clinic here in our office.   If you have any questions or concerns before your next appointment please send [place] a message through [place] or call our office at [phone].    TO LEAVE A MESSAGE FOR THE NURSE SELECT OPTION 2, PLEASE LEAVE A MESSAGE INCLUDING: YOUR NAME DATE OF BIRTH CALL BACK NUMBER REASON FOR CALL**this is important as we prioritize the call backs  YOU WILL RECEIVE A CALL BACK THE SAME DAY AS LONG AS YOU CALL BEFORE 4:00 PM   Do the following things EVERYDAY: Weigh yourself in the morning before breakfast. Write it down and keep it in a log. Take your medicines as prescribed Eat low salt foods--Limit salt (sodium) to 2000 mg per day.  Stay as active as you can everyday Limit all fluids for the day to less than 2 liters   At the [facility], you and your health needs are our priority. As part of our continuing mission to provide you with exceptional heart care, we have created designated Provider Care Teams. These Care Teams include your primary Cardiologist (physician) and Advanced Practice Providers (APPs- Physician Assistants and Nurse Practitioners) who all work together to provide you with the care you need, when you need it.   You may see any of the following providers on your designated Care Team at your next follow up: Dr Glori Bickers Dr Haynes Kerns, NP Lyda Jester, [place] Audry Riles,  PharmD   Please be sure to bring in all your medications bottles to every appointment.

## 2020-11-13 NOTE — Progress Notes (Signed)
Advanced Heart Failure Clinic Note   Referring Physician: PCP: Maury Dus, MD PCP-Cardiologist: Elouise Munroe, MD  HF Cardiology: Dr. Aundra Dubin   HPI: Alexandria Ruiz is a 65 y.o. with history of asthma, HTN, HLD, DMII, OA,  R TKR & LTKR, OSA and recently diagnosed systolic heart failure.   Evaluated by Dr Margaretann Loveless for dyspnea in 2021. Had myoview and ECHO that showed reduced EF, 35-40%. She was set up for  [facility] on 09/27/19 that showed normal cors, markedly elevated filling pressures, and low cardiac output, CI 1.3.  Suspect flash pulmonary edema.  She was hypertensive in the cath lab so Nitroglycerin drip was started. She was admitted, placed on milrinone and diuresed w/ IV Lasix. Cause of cardiomyopathy felt to be hypertension. She was diuresed and started on GDMT. Milrinone weaned off w/ stable Co-ox (73%).  Echo in 10/21 showed EF up to 50%, mild LVH, mildly decreased RV systolic function, mild MR.  She had to stop Jardiance due to recurrent yeast infections.   She returns for followup of CHF and HTN.  Weight is up 21 lbs.  She is short of breath walking about 100 feet, she says that this is unchanged.  She has pain in her lower back/upper buttocks area.  This is chronic.  No chest pain.  She has chronic orthopnea.  No problems walking around her house or doing ADLs.  She is using CPAP.   Labs (7/21): K 4, creatinine 1.09 Labs (9/21): K 4.2, creatinine 1.09 Labs (10/21): K 4, creatinine 1.17  Review of Systems: All systems reviewed and negative except as per HPI.   PMH: 1. Type 2 diabetes.  2. GERD 3. HTN 4. Hyperlipidemia 5. OSA: CPAP.  6. OA with bilateral TKRs.  7. Asthma 8. Chronic systolic CHF: Nonischemic cardiomyopathy.  - Echo (6/21): EF 35-40%, global HK, moderate LV dilation, RV normal,  - LHC/RHC (7/21): No coronary disease.  Mean RA 35, PA 70/44, mean PCWP 46, CI 1.33.  - Echo (10/21): EF up to 50%, mild LVH, mildly decreased RV systolic function, mild MR. 9.  Chronic LBP.  Current Outpatient Medications  Medication Sig Dispense Refill   ALPRAZolam (XANAX) 0.5 MG tablet Take 0.25 mg by mouth daily as needed for anxiety.      budesonide-formoterol (SYMBICORT) 160-4.5 MCG/ACT inhaler Inhale 2 puffs into the lungs 2 (two) times daily.     carvedilol (COREG) 6.25 MG tablet Take 1 tablet (6.25 mg total) by mouth 2 (two) times daily with a meal. 60 tablet 11   DULoxetine (CYMBALTA) 60 MG capsule Take 60 mg by mouth 2 (two) times daily.     estradiol (ESTRACE) 1 MG tablet Take 1 mg by mouth at bedtime.     sacubitril-valsartan (ENTRESTO) 49-51 MG Take 1 tablet by mouth 2 (two) times daily. 60 tablet 11   simvastatin (ZOCOR) 40 MG tablet Take 40 mg by mouth daily at 6 PM.     spironolactone (ALDACTONE) 25 MG tablet Take 1 tablet (25 mg total) by mouth daily. 30 tablet 11   sucralfate (CARAFATE) 1 g tablet Take 1 g by mouth as needed.     TRULICITY 1.5 0000000 SOPN Inject 1.5 mg into the skin every Saturday.   6   valACYclovir (VALTREX) 500 MG tablet Take 500 mg by mouth daily.     furosemide (LASIX) 40 MG tablet Take 1.5 tablets (60 mg total) by mouth daily. 135 tablet 3   potassium chloride SA (KLOR-CON) 20 MEQ tablet Take 1.5  tablets (30 mEq total) by mouth daily. 135 tablet 3   No current facility-administered medications for this encounter.    Allergies  Allergen Reactions   Hydrocodone Itching   Oxycodone Itching      Social History   Socioeconomic History   Marital status: Married    Spouse name: Not on file   Number of children: Not on file   Years of education: Not on file   Highest education level: Not on file  Occupational History   Not on file  Tobacco Use   Smoking status: Former    Packs/day: 0.50    Years: 30.00    Pack years: 15.00    Types: Cigarettes    Quit date: 10/29/2006    Years since quitting: 14.0   Smokeless tobacco: Never  Vaping Use   Vaping Use: Never used  Substance and Sexual Activity   Alcohol use:  No   Drug use: No   Sexual activity: Not Currently  Other Topics Concern   Not on file  Social History Narrative   Not on file   Social Determinants of Health   Financial Resource Strain: Not on file  Food Insecurity: Not on file  Transportation Needs: Not on file  Physical Activity: Not on file  Stress: Not on file  Social Connections: Not on file  Intimate Partner Violence: Not on file      Family History  Adopted: Yes    Vitals:   11/13/20 1039  BP: 118/70  Pulse: 98  SpO2: 96%  Weight: (!) 156 kg (344 lb)     PHYSICAL EXAM: General: NAD, obese.  Neck: JVP 8-9 cm, no thyromegaly or thyroid nodule.  Lungs: Clear to auscultation bilaterally with normal respiratory effort. CV: Nondisplaced PMI.  Heart regular S1/S2, no S3/S4, no murmur.  Trace ankle edema.  No carotid bruit.  Normal pedal pulses.  Abdomen: Soft, nontender, no hepatosplenomegaly, no distention.  Skin: Intact without lesions or rashes.  Neurologic: Alert and oriented x 3.  Psych: Normal affect. Extremities: No clubbing or cyanosis.  HEENT: Normal.   ASSESSMENT & PLAN:  1. Chronic Systolic CHF: Nonischemic cardiomyopathy.  Echo (6/21) showed EF 35-40%, [facility] 7/21 demonstrated markedly elevated filling pressures and low CI 1.3. Normal coronaries. Suspect hypertensive cardiomyopathy (markedly hypertensive at time of cath requiring NTG gtt). She had marked improvement rapidly with BP lowering and diuresis. Suspect she had flash pulmonary edema in the cath lab due to hypertensive emergency. Required milrinone to help w/ CO and help w/ diuresis. Repeat echo in 4/21 showed EF up to 50%.  NYHA class II-III symptoms.  She is mildly volume overloaded.  Weight is up a lot, I think this is a combination of excess calories and fluid retention.  - Continue Entresto 49/51 bid. - Increase Lasix to 60 mg daily and KCl to 30 mEq daily.  BMET today and in 10 days.  - Continue spironolactone 25 mg daily  - Continue Coreg  6.25 mg bid  - Unable to take Jardiance due to recurrent yeast infections.    - She is out of ICD range.  2. HTN: Controlled on current meds.  3. OSA: Continue CPAP. She reports full compliance  4. Obesity: Weight is heading up.  - Refer to pharmacy clinic for semaglutide.   Followup in 1 month with APP to reassess volume.   Loralie Champagne, MD 11/13/20

## 2020-11-14 ENCOUNTER — Telehealth (HOSPITAL_COMMUNITY): Payer: Self-pay

## 2020-11-14 NOTE — Telephone Encounter (Signed)
Patient called triage line requesting a return call about a medication, no answer,lmtrc

## 2020-11-26 ENCOUNTER — Other Ambulatory Visit: Payer: Self-pay

## 2020-11-26 ENCOUNTER — Ambulatory Visit (HOSPITAL_COMMUNITY)
Admission: RE | Admit: 2020-11-26 | Discharge: 2020-11-26 | Disposition: A | Discharge status: Home / Self Care | Payer: 59 | Source: Ambulatory Visit | Attending: Internal Medicine | Admitting: Internal Medicine

## 2020-11-26 ENCOUNTER — Encounter (HOSPITAL_COMMUNITY): Payer: Self-pay

## 2020-11-26 DIAGNOSIS — I5022 Chronic systolic (congestive) heart failure: Secondary | ICD-10-CM | POA: Diagnosis present

## 2020-11-26 LAB — BASIC METABOLIC PANEL
Anion gap: 12 (ref 5–15)
BUN: 17 mg/dL (ref 8–23)
CO2: 23 mmol/L (ref 22–32)
Calcium: 9.3 mg/dL (ref 8.9–10.3)
Chloride: 100 mmol/L (ref 98–111)
Creatinine, Ser: 1.08 mg/dL — ABNORMAL HIGH (ref 0.44–1.00)
GFR, Estimated: 58 mL/min — ABNORMAL LOW (ref >60)
Glucose, Bld: 106 mg/dL — ABNORMAL HIGH (ref 70–99)
Potassium: 4.2 mmol/L (ref 3.5–5.1)
Sodium: 135 mmol/L (ref 135–145)

## 2020-11-28 ENCOUNTER — Encounter: Payer: Self-pay | Admitting: *Deleted

## 2020-12-15 ENCOUNTER — Encounter (HOSPITAL_COMMUNITY): Payer: 59

## 2020-12-15 NOTE — Progress Notes (Signed)
Advanced Heart Failure Clinic Note   PCP: Maury Dus, MD PCP-Cardiologist: Elouise Munroe, MD  HF Cardiology: Dr. Aundra Dubin   HPI: Alexandria Ruiz is a 65 y.o. with history of asthma, HTN, HLD, DMII, OA,  R TKR & LTKR, OSA and recently diagnosed systolic heart failure.   Evaluated by Dr Margaretann Loveless for dyspnea in 2021. Had myoview and ECHO that showed reduced EF, 35-40%. She was set up for [facility] on 09/27/19 that showed normal cors, markedly elevated filling pressures, and low cardiac output, CI 1.3.  Suspect flash pulmonary edema.  She was hypertensive in the cath lab so nitroglycerin drip was started. She was admitted, placed on milrinone and diuresed w/ IV Lasix. Cause of cardiomyopathy felt to be hypertension. She was diuresed and started on GDMT. Milrinone weaned off w/ stable Co-ox (73%).  Echo in 10/21 showed EF up to 50%, mild LVH, mildly decreased RV systolic function, mild MR.  She had to stop Jardiance due to recurrent yeast infections.   Today she returns for HF follow up. She was mildly volume up last visit and lasix increased. She restarted her Jardiance 3 weeks ago on her own; previously off due to yeast infections. She has chronic back pain and pulled a hamstring, and feels limited mostly by ortho issues and feels breathing is stable. Does get somewhat short of breath walking further distances on flat ground. Denies CP, dizziness, edema. + chronic orthopnea.  Appetite ok. No fever or chills. Weight at home 341 pounds. Taking all medications. She is using her CPAP. She works full time as a Careers information officer.  Labs (7/21): K 4, creatinine 1.09 Labs (9/21): K 4.2, creatinine 1.09 Labs (10/21): K 4, creatinine 1.17 Labs (9/22): K 4.2, creatinine 1.08  Review of Systems: All systems reviewed and negative except as per HPI.   PMH: 1. Type 2 diabetes.  2. GERD 3. HTN 4. Hyperlipidemia 5. OSA: CPAP.  6. OA with bilateral TKRs.  7. Asthma 8. Chronic systolic CHF: Nonischemic  cardiomyopathy.  - Echo (6/21): EF 35-40%, global HK, moderate LV dilation, RV normal,  - LHC/RHC (7/21): No coronary disease.  Mean RA 35, PA 70/44, mean PCWP 46, CI 1.33.  - Echo (10/21): EF up to 50%, mild LVH, mildly decreased RV systolic function, mild MR. 9. Chronic LBP.  Current Outpatient Medications  Medication Sig Dispense Refill   ALPRAZolam (XANAX) 0.5 MG tablet Take 0.25 mg by mouth daily as needed for anxiety.      budesonide-formoterol (SYMBICORT) 160-4.5 MCG/ACT inhaler Inhale 2 puffs into the lungs 2 (two) times daily.     carvedilol (COREG) 6.25 MG tablet Take 1 tablet (6.25 mg total) by mouth 2 (two) times daily with a meal. 60 tablet 11   DULoxetine (CYMBALTA) 60 MG capsule Take 60 mg by mouth 2 (two) times daily.     estradiol (ESTRACE) 1 MG tablet Take 1 mg by mouth at bedtime.     furosemide (LASIX) 40 MG tablet Take 1.5 tablets (60 mg total) by mouth daily. 135 tablet 3   potassium chloride SA (KLOR-CON) 20 MEQ tablet Take 1.5 tablets (30 mEq total) by mouth daily. 135 tablet 3   sacubitril-valsartan (ENTRESTO) 49-51 MG Take 1 tablet by mouth 2 (two) times daily. 60 tablet 11   simvastatin (ZOCOR) 40 MG tablet Take 40 mg by mouth daily at 6 PM.     spironolactone (ALDACTONE) 25 MG tablet Take 1 tablet (25 mg total) by mouth daily. 30 tablet 11  sucralfate (CARAFATE) 1 g tablet Take 1 g by mouth as needed.     TRULICITY 1.5 XB/1.4NW SOPN Inject 1.5 mg into the skin every Saturday.   6   valACYclovir (VALTREX) 500 MG tablet Take 500 mg by mouth daily.     No current facility-administered medications for this encounter.   Allergies  Allergen Reactions   Hydrocodone Itching   Oxycodone Itching   Social History   Socioeconomic History   Marital status: Married    Spouse name: Not on file   Number of children: Not on file   Years of education: Not on file   Highest education level: Not on file  Occupational History   Not on file  Tobacco Use   Smoking  status: Former    Packs/day: 0.50    Years: 30.00    Pack years: 15.00    Types: Cigarettes    Quit date: 10/29/2006    Years since quitting: 14.1   Smokeless tobacco: Never  Vaping Use   Vaping Use: Never used  Substance and Sexual Activity   Alcohol use: No   Drug use: No   Sexual activity: Not Currently  Other Topics Concern   Not on file  Social History Narrative   Not on file   Social Determinants of Health   Financial Resource Strain: Not on file  Food Insecurity: Not on file  Transportation Needs: Not on file  Physical Activity: Not on file  Stress: Not on file  Social Connections: Not on file  Intimate Partner Violence: Not on file   Family History  Adopted: Yes   BP 124/78   Pulse 88   Wt (!) 157.6 kg (347 lb 6.4 oz)   SpO2 95%   BMI 48.45 kg/m   Wt Readings from Last 3 Encounters:  12/16/20 (!) 157.6 kg (347 lb 6.4 oz)  11/13/20 (!) 156 kg (344 lb)  01/09/20 (!) 146.7 kg (323 lb 6.4 oz)   PHYSICAL EXAM: General:  NAD. No resp difficulty HEENT: Normal Neck: Supple. No JVD. Carotids 2+ bilat; no bruits. No lymphadenopathy or thryomegaly appreciated. Cor: PMI nondisplaced. Regular rate & rhythm. No rubs, gallops or murmurs. Lungs: Clear Abdomen: Obese, nontender, nondistended. No hepatosplenomegaly. No bruits or masses. Good bowel sounds. Extremities: No cyanosis, clubbing, rash, edema Neuro: Alert & oriented x 3, cranial nerves grossly intact. Moves all 4 extremities w/o difficulty. Affect pleasant.  ASSESSMENT & PLAN:  1. Chronic Systolic CHF: Nonischemic cardiomyopathy.  Echo (6/21) showed EF 35-40%, [facility] 7/21 demonstrated markedly elevated filling pressures and low CI 1.3. Normal coronaries. Suspect hypertensive cardiomyopathy (markedly hypertensive at time of cath requiring NTG gtt). She had marked improvement rapidly with BP lowering and diuresis. Suspect she had flash pulmonary edema in the cath lab due to hypertensive emergency. Required  milrinone to help w/ CO and help w/ diuresis. Repeat echo in 4/21 showed EF up to 50%.  Better NYHA class II symptoms.  She does not appear volume overloaded today, weight remains up 3 lbs. ? combination of excess calories and mild fluid retention.  - She is back on Jardiance 10 mg daily. (Previously off due to yeast infections). Will keep on for now, watch closely for GU symptoms. - Continue Entresto 49/51 mg bid.  - Continue Lasix 60 mg daily and KCl 30 mEq daily.  BMET today.  - Continue spironolactone 25 mg daily. - Continue Coreg 6.25 mg bid.  - She is out of ICD range.  2. HTN: Controlled on current  meds.  3. OSA: Continue CPAP. She reports full compliance  4. Obesity: Weight continues to trend up. Body mass index is 48.45 kg/m. - She has been referred to pharmacy clinic for semaglutide. She currently takes dulaglutide.  Followup in 2 months with APP. Consider switching furosemide to torsemide and/or increasing Entresto.  [place], FNP 12/16/20

## 2020-12-16 ENCOUNTER — Ambulatory Visit (HOSPITAL_COMMUNITY)
Admission: RE | Admit: 2020-12-16 | Discharge: 2020-12-16 | Disposition: A | Discharge status: Home / Self Care | Payer: 59 | Source: Ambulatory Visit | Attending: Family Medicine | Admitting: Family Medicine

## 2020-12-16 ENCOUNTER — Other Ambulatory Visit: Payer: Self-pay

## 2020-12-16 ENCOUNTER — Encounter (HOSPITAL_COMMUNITY): Payer: Self-pay

## 2020-12-16 VITALS — BP 124/78 | HR 88 | Wt 347.4 lb

## 2020-12-16 DIAGNOSIS — Z7985 Long-term (current) use of injectable non-insulin antidiabetic drugs: Secondary | ICD-10-CM | POA: Diagnosis not present

## 2020-12-16 DIAGNOSIS — Z6841 Body Mass Index (BMI) 40.0 and over, adult: Secondary | ICD-10-CM | POA: Insufficient documentation

## 2020-12-16 DIAGNOSIS — Z79899 Other long term (current) drug therapy: Secondary | ICD-10-CM | POA: Insufficient documentation

## 2020-12-16 DIAGNOSIS — Z885 Allergy status to narcotic agent status: Secondary | ICD-10-CM | POA: Diagnosis not present

## 2020-12-16 DIAGNOSIS — M549 Dorsalgia, unspecified: Secondary | ICD-10-CM | POA: Insufficient documentation

## 2020-12-16 DIAGNOSIS — M1990 Unspecified osteoarthritis, unspecified site: Secondary | ICD-10-CM | POA: Diagnosis not present

## 2020-12-16 DIAGNOSIS — I110 Hypertensive heart disease with heart failure: Secondary | ICD-10-CM | POA: Insufficient documentation

## 2020-12-16 DIAGNOSIS — Z7951 Long term (current) use of inhaled steroids: Secondary | ICD-10-CM | POA: Insufficient documentation

## 2020-12-16 DIAGNOSIS — I5022 Chronic systolic (congestive) heart failure: Secondary | ICD-10-CM | POA: Insufficient documentation

## 2020-12-16 DIAGNOSIS — I10 Essential (primary) hypertension: Secondary | ICD-10-CM | POA: Diagnosis not present

## 2020-12-16 DIAGNOSIS — I428 Other cardiomyopathies: Secondary | ICD-10-CM | POA: Diagnosis not present

## 2020-12-16 DIAGNOSIS — I340 Nonrheumatic mitral (valve) insufficiency: Secondary | ICD-10-CM | POA: Diagnosis not present

## 2020-12-16 DIAGNOSIS — Z79890 Hormone replacement therapy (postmenopausal): Secondary | ICD-10-CM | POA: Insufficient documentation

## 2020-12-16 DIAGNOSIS — I5042 Chronic combined systolic (congestive) and diastolic (congestive) heart failure: Secondary | ICD-10-CM | POA: Diagnosis not present

## 2020-12-16 DIAGNOSIS — Z87891 Personal history of nicotine dependence: Secondary | ICD-10-CM | POA: Diagnosis not present

## 2020-12-16 DIAGNOSIS — E785 Hyperlipidemia, unspecified: Secondary | ICD-10-CM | POA: Insufficient documentation

## 2020-12-16 DIAGNOSIS — Z7984 Long term (current) use of oral hypoglycemic drugs: Secondary | ICD-10-CM | POA: Diagnosis not present

## 2020-12-16 DIAGNOSIS — E669 Obesity, unspecified: Secondary | ICD-10-CM | POA: Diagnosis not present

## 2020-12-16 DIAGNOSIS — G8929 Other chronic pain: Secondary | ICD-10-CM | POA: Insufficient documentation

## 2020-12-16 DIAGNOSIS — E119 Type 2 diabetes mellitus without complications: Secondary | ICD-10-CM | POA: Insufficient documentation

## 2020-12-16 DIAGNOSIS — Z9989 Dependence on other enabling machines and devices: Secondary | ICD-10-CM | POA: Insufficient documentation

## 2020-12-16 DIAGNOSIS — G4733 Obstructive sleep apnea (adult) (pediatric): Secondary | ICD-10-CM | POA: Diagnosis not present

## 2020-12-16 NOTE — Patient Instructions (Signed)
It was great to see you today! No medication changes are needed at this time.   Labs today We will only contact you if something comes back abnormal or we need to make some changes. Otherwise no news is good news!  You have been referred to [place] Team   Your physician recommends that you schedule a follow-up appointment in: 2 months  in the Advanced Practitioners (PA/NP) Clinic    Do the following things EVERYDAY: Weigh yourself in the morning before breakfast. Write it down and keep it in a log. Take your medicines as prescribed Eat low salt foods--Limit salt (sodium) to 2000 mg per day.  Stay as active as you can everyday Limit all fluids for the day to less than 2 liters  At the [facility], you and your health needs are our priority. As part of our continuing mission to provide you with exceptional heart care, we have created designated Provider Care Teams. These Care Teams include your primary Cardiologist (physician) and Advanced Practice Providers (APPs- Physician Assistants and Nurse Practitioners) who all work together to provide you with the care you need, when you need it.   You may see any of the following providers on your designated Care Team at your next follow up: Dr Glori Bickers Dr Loralie Champagne Dr Patrice Paradise, NP Lyda Jester, [place] Ginnie Smart Audry Riles, PharmD   Please be sure to bring in all your medications bottles to every appointment.

## 2020-12-17 LAB — BASIC METABOLIC PANEL
Anion gap: 11 (ref 5–15)
BUN: 16 mg/dL (ref 8–23)
CO2: 23 mmol/L (ref 22–32)
Calcium: 9.6 mg/dL (ref 8.9–10.3)
Chloride: 100 mmol/L (ref 98–111)
Creatinine, Ser: 1.04 mg/dL — ABNORMAL HIGH (ref 0.44–1.00)
GFR, Estimated: >60 mL/min (ref >60)
Glucose, Bld: 64 mg/dL — ABNORMAL LOW (ref 70–99)
Potassium: 4.6 mmol/L (ref 3.5–5.1)
Sodium: 134 mmol/L — ABNORMAL LOW (ref 135–145)

## 2020-12-25 ENCOUNTER — Other Ambulatory Visit: Payer: Self-pay

## 2020-12-25 ENCOUNTER — Ambulatory Visit (INDEPENDENT_AMBULATORY_CARE_PROVIDER_SITE_OTHER): Payer: 59 | Admitting: Pharmacist

## 2020-12-25 DIAGNOSIS — Z6841 Body Mass Index (BMI) 40.0 and over, adult: Secondary | ICD-10-CM

## 2020-12-25 MED ORDER — OZEMPIC (1 MG/DOSE) 4 MG/3ML ~~LOC~~ SOPN: 1.0000 mg | PEN_INJECTOR | SUBCUTANEOUS | 0 refills | Status: DC

## 2020-12-25 NOTE — Patient Instructions (Addendum)
Jinny Blossom, PharmD # [phone]  I will call you when I hear back from your insurance on coverage for either [facility] or Ozempic. The injection would replace your Trulicity that you take on Saturdays  Ozempic/[facility] This medication reduces your appetite and may make you feel fuller longer.  Stop eating when your body tells you that you are full. This will likely happen sooner than you are used to. Store your medication in the fridge until you are ready to use it. Inject your medication in the fatty tissue of your lower abdominal area (2 inches away from belly button) or upper outer thigh. Rotate injection sites. Each pen will last you about 1 month (the first month it will last a few weeks longer). Use a different needle with each weekly injection. Common side effects include: nausea, diarrhea/constipation, and heartburn, and are more likely to occur if you overeat.  Tips for living a healthier life  SUGAR  Sugar is a huge problem in the modern day diet. Sugar is a big contributor to heart disease, diabetes, high triglyceride levels, fatty liver disease and obesity. Sugar is hidden in almost all packaged foods/beverages. Added sugar is extra sugar that is added beyond what is naturally found and has no nutritional benefit for your body. The American Heart Association recommends limiting added sugars to no more than 25g for women and 36 grams for men per day. There are many names for sugar including maltose, sucrose (names ending in "ose"), high fructose corn syrup, molasses, cane sugar, corn sweetener, raw sugar, syrup, honey or fruit juice concentrate.   One of the best ways to limit your added sugars is to stop drinking sweetened beverages such as soda, sweet tea, and fruit juice. There is 65g of added sugars in one 20oz bottle of Coke! That is equal to 6 donuts.   Pay attention and read all nutrition facts labels. Below is an examples of a nutrition facts label. The #1 is showing  you the total sugars where the # 2 is showing you the added sugars. This one serving has almost the max amount of added sugars per day!     EXERCISE  Exercise is good. We've all heard that. In an ideal world, we would all have time and resources to get plenty of it. When you are active, your heart pumps more efficiently and you will feel better.  Multiple studies show that even walking regularly has benefits that include living a longer life. The American Heart Association recommends 150 minutes per week of exercise (30 minutes per day most days of the week). You can do this in any increment you wish. Nine or more 10-minute walks count. So does an hour-long exercise class. Break the time apart into what will work in your life. Some of the best things you can do include walking briskly, jogging, cycling or swimming laps. Not everyone is ready to "exercise." Sometimes we need to start with just getting active. Here are some easy ways to be more active throughout the day:  Take the stairs instead of the elevator  Go for a 10-15 minute walk during your lunch break (find a friend to make it more enjoyable)  When shopping, park at the back of the parking lot  If you take public transportation, get off one stop early and walk the extra distance  Pace around while making phone calls  Check with your doctor if you aren't sure what your limitations may be. Always remember to drink plenty  of water when doing any type of exercise. Don't feel like a failure if you're not getting the 90-150 minutes per week. If you started by being a couch potato, then just a 10-minute walk each day is a huge improvement. Start with little victories and work your way up.   HEALTHY EATING TIPS  When looking to improve your eating habits, whether to lose weight, lower blood pressure or just be healthier, it helps to know what a serving size is.   Grains 1 slice of bread,  bagel,  cup pasta or rice  Vegetables 1 cup fresh or  raw vegetables,  cup cooked or canned Fruits 1 piece of medium sized fruit,  cup canned,   Meats/Proteins  cup dried       1 oz meat, 1 egg,  cup cooked beans, nuts or seeds  Dairy        Fats Individual yogurt container, 1 cup (8oz)    1 teaspoon margarine/butter or vegetable  milk or milk alternative, 1 slice of cheese          oil; 1 tablespoon mayonnaise or salad dressing                  Plan ahead: make a menu of the meals for a week then create a grocery list to go with that menu. Consider meals that easily stretch into a night of leftovers, such as stews or casseroles. Or consider making two of your favorite meal and put one in the freezer for another night. Try a night or two each week that is "meatless" or "no cook" such as salads. When you get home from the grocery store wash and prepare your vegetables and fruits. Then when you need them they are ready to go.   Tips for going to the grocery store:  [place] store or generic brands  Check the weekly ad from your store on-line or in their in-store flyer  Look at the unit price on the shelf tag to compare/contrast the costs of different items  Buy fruits/vegetables in season  Carrots, bananas and apples are low-cost, naturally healthy items  If meats or frozen vegetables are on sale, buy some extras and put in your freezer  Limit buying prepared or "ready to eat" items, even if they are pre-made salads or fruit snacks  Do not shop when you're hungry  Foods at eye level tend to be more expensive. Look on the high and low shelves for deals.  Consider shopping at the farmer's market for fresh foods in season.  Avoid the cookie and chip aisles (these are expensive, high in calories and low in nutritional value). Shop on the outside of the grocery store.  Healthy food preparations:  If you can't get lean hamburger, be sure to drain the fat when cooking  Steam, saut (in olive oil), grill or bake foods  Experiment with different  seasonings to avoid adding salt to your foods. Kosher salt, sea salt and Himalayan salt are all still salt and should be avoided. Try seasoning food with onion, garlic, thyme, rosemary, basil ect. Onion powder or garlic powder is ok. Avoid if it says salt (ie garlic salt).        Other resources: American Heart Association - InstantFinish.fi         Go to the Healthy Living tab to get more information American Diabetes Association - www.diabetes.org         You don't have to be diabetic - check  out the Food and Fitness tab

## 2020-12-25 NOTE — Progress Notes (Addendum)
Patient ID: Alexandria Ruiz                 DOB: 03-15-1956                    MRN: [id number]     HPI: Alexandria Ruiz is a 65 y.o. female patient referred to pharmacy clinic by Dr Newman Nip, NP to initiate weight loss therapy with GLP1-RA. PMH is significant for obesity complicated by chronic medical conditions including Z0CH, systolic HF with EF 85-27% 6/21 improved to 50% on 10/21 echo following with advanced CHF clinic, HTN, HLD, OA, OSA on CPAP, and asthma. Most recent BMI 49.   Pt presents today in good spirits. Currently takes Trulicity 1.$RemoveBeforeDEI'5mg'LFkwikkgibxXpezl$  weekly (on Saturdays) and Jardiance $RemoveBefor'10mg'XmYNwqZippey$  daily for her DM. Most recent A1c 6.3% - [facility] following. Reports she has gained about 50 lbs in the past year. Appetite has increased and has been eating larger portions. Also reports weight gain after she stopped smoking in 2008. Has had trouble with her hamstring for the past few years that affects her ability to exercise.   Current weight management medications: Trulicity  Previously tried meds: phentermine, worked well but then advised she had to stop due to side effect profile  Current meds that may affect weight: estradiol, duloxetine  Baseline weight/BMI: 349 lbs, 49  Insurance payor: Higher education careers adviser  Diet:  -Breakfast: cereal - corn flakes or rice crispies, pears -Lunch: apple, graham crackers with peanut butter, popcorn -Dinner: husband cooks - chinese and pie last night, otherwise spaghetti, garlic bread, bakes doesn't fry food, baked chicken, greens -Snacks: not much, eats mainly at meal time -Drinks: water - 4 bottles a day, chamomile tea, cranberry juice  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 Injured hamstring 3-4 years ago, went to PT, still hurts  Family History: adopted  Social History: Former smoker 1/2 PPD for 30 years, quit in 2008. Denies alcohol and drug use.  Labs: 08/04/20: TC 187, TG 124, HDL 59, LDL 106   Wt Readings from Last 1 Encounters:  12/16/20 (!)  347 lb 6.4 oz (157.6 kg)    BP Readings from Last 1 Encounters:  12/16/20 124/78   Pulse Readings from Last 1 Encounters:  12/16/20 88    Past Medical History:  Diagnosis Date   Abdominal pain    Allergic rhinitis    Anemia    Anxiety    Arthritis    Asthma    Bronchitis    Diabetes mellitus    type 2   GERD (gastroesophageal reflux disease)    Hyperlipidemia    Hypertension    Obesity    Peripheral neuropathy    denies has carpal tunnel   PONV (postoperative nausea and vomiting)    Shortness of breath dyspnea    since starting Trulicity diabetic med   Sleep apnea    uses c-pap machine    Current Outpatient Medications on File Prior to Visit  Medication Sig Dispense Refill   ALPRAZolam (XANAX) 0.5 MG tablet Take 0.25 mg by mouth daily as needed for anxiety.      budesonide-formoterol (SYMBICORT) 160-4.5 MCG/ACT inhaler Inhale 2 puffs into the lungs 2 (two) times daily.     carvedilol (COREG) 6.25 MG tablet Take 1 tablet (6.25 mg total) by mouth 2 (two) times daily with a meal. 60 tablet 11   DULoxetine (CYMBALTA) 60 MG capsule Take 60 mg by mouth 2 (two) times daily.     empagliflozin (JARDIANCE) 10  MG TABS tablet Take 10 mg by mouth daily.     estradiol (ESTRACE) 1 MG tablet Take 1 mg by mouth at bedtime.     furosemide (LASIX) 40 MG tablet Take 1.5 tablets (60 mg total) by mouth daily. 135 tablet 3   potassium chloride SA (KLOR-CON) 20 MEQ tablet Take 1.5 tablets (30 mEq total) by mouth daily. 135 tablet 3   sacubitril-valsartan (ENTRESTO) 49-51 MG Take 1 tablet by mouth 2 (two) times daily. 60 tablet 11   simvastatin (ZOCOR) 40 MG tablet Take 40 mg by mouth daily at 6 PM.     spironolactone (ALDACTONE) 25 MG tablet Take 1 tablet (25 mg total) by mouth daily. 30 tablet 11   sucralfate (CARAFATE) 1 g tablet Take 1 g by mouth as needed.     TRULICITY 1.5 RX/5.4MG SOPN Inject 1.5 mg into the skin every Saturday.   6   valACYclovir (VALTREX) 500 MG tablet Take 500 mg by  mouth daily.     No current facility-administered medications on file prior to visit.    Allergies  Allergen Reactions   Hydrocodone Itching   Oxycodone Itching     Assessment/Plan:  1. Weight loss - Patient has not met goal of at least 5% of body weight loss with comprehensive lifestyle modifications alone in the past 3-6 months. Pharmacotherapy is appropriate to pursue as augmentation. Will start semaglutide 67m once weekly as transition over from Trulicity 18.6PYweekly. [facility] pens to Ozempic (also available in higher dose for more effective weight loss). If insurance does not cover Wegovy, they do cover Ozempic which pt will need to start for her month of 1535mdosing since Wegovy 35m53ms still on national backorder.   Advised patient on common side effects including nausea, diarrhea, dyspepsia, decreased appetite, and fatigue. Counseled patient on reducing meal size and how to titrate medication to minimize side effects. Counseled patient to call if intolerable side effects or if experiencing dehydration, abdominal pain, or dizziness. Patient will adhere to dietary modifications and will target at least 150 minutes of moderate intensity exercise weekly.   Follow up in 1 month via telephone.  [name] E. Ercell Perlman, PharmD, BCACP, [id number] [id number]: [phone]: [phone]/13/2022 2:46 PM  Addendum: Confirmed with pt's pharmacy that Ozempic is available without prior authorization for $0 copay. She will start Ozempic 35mg85mekly for the next 4 weeks and will stop her Trulicity. WegoBHALPFdenied - her insurance does not cover weight loss medications, pt is aware. I'll call her in 1 month to titrate up to the 2mg 15mkly dosing for Ozempic.

## 2020-12-29 ENCOUNTER — Other Ambulatory Visit (HOSPITAL_COMMUNITY): Payer: Self-pay | Admitting: Cardiology

## 2020-12-30 ENCOUNTER — Encounter (HOSPITAL_COMMUNITY): Payer: Self-pay

## 2020-12-31 ENCOUNTER — Other Ambulatory Visit (HOSPITAL_COMMUNITY): Payer: Self-pay | Admitting: *Deleted

## 2020-12-31 MED ORDER — BENZONATATE 100 MG PO CAPS: 100.0000 mg | ORAL_CAPSULE | Freq: Three times a day (TID) | ORAL | 0 refills | Status: DC | PRN

## 2021-01-04 ENCOUNTER — Encounter (HOSPITAL_COMMUNITY): Payer: Self-pay

## 2021-01-05 ENCOUNTER — Other Ambulatory Visit (HOSPITAL_COMMUNITY): Payer: Self-pay

## 2021-01-05 MED ORDER — SPIRONOLACTONE 25 MG PO TABS: 25.0000 mg | ORAL_TABLET | Freq: Every day | ORAL | 3 refills | Status: DC

## 2021-01-06 ENCOUNTER — Other Ambulatory Visit (HOSPITAL_COMMUNITY): Payer: Self-pay | Admitting: *Deleted

## 2021-01-06 MED ORDER — ENTRESTO 49-51 MG PO TABS: 1.0000 | ORAL_TABLET | Freq: Two times a day (BID) | ORAL | 3 refills | Status: DC

## 2021-01-11 ENCOUNTER — Other Ambulatory Visit (HOSPITAL_COMMUNITY): Payer: Self-pay | Admitting: Cardiology

## 2021-01-22 ENCOUNTER — Other Ambulatory Visit: Payer: Self-pay

## 2021-01-22 ENCOUNTER — Telehealth: Payer: Self-pay | Admitting: Pharmacist

## 2021-01-22 DIAGNOSIS — I5043 Acute on chronic combined systolic (congestive) and diastolic (congestive) heart failure: Secondary | ICD-10-CM

## 2021-01-22 DIAGNOSIS — I428 Other cardiomyopathies: Secondary | ICD-10-CM

## 2021-01-22 DIAGNOSIS — E1169 Type 2 diabetes mellitus with other specified complication: Secondary | ICD-10-CM

## 2021-01-22 NOTE — Telephone Encounter (Signed)
Called pt to follow up with Ozempic tolerability and left message.   Transitioned from Trulicity 1.5mg  weekly to Ozempic 1mg  weekly at visit 1 month ago. Mancel Parsons not covered on her formulary. Ideally would like to increase Ozempic to 2mg  if pt tolerating current dose well, however I called her pharmacy and 2mg  dose is still on backorder with unknown delivery time. Will plan to send in 3 month rx of Ozempic 1mg  weekly if pt tolerating well (3 month supply is free on her insurance plan), then try again for 2mg  dose at that time.

## 2021-01-23 MED ORDER — OZEMPIC (1 MG/DOSE) 4 MG/3ML ~~LOC~~ SOPN: 1.0000 mg | PEN_INJECTOR | SUBCUTANEOUS | 0 refills | Status: DC

## 2021-01-24 ENCOUNTER — Other Ambulatory Visit (HOSPITAL_COMMUNITY): Payer: Self-pay | Admitting: Cardiology

## 2021-01-28 ENCOUNTER — Other Ambulatory Visit: Payer: Self-pay

## 2021-01-28 DIAGNOSIS — I5043 Acute on chronic combined systolic (congestive) and diastolic (congestive) heart failure: Secondary | ICD-10-CM

## 2021-01-28 DIAGNOSIS — E1169 Type 2 diabetes mellitus with other specified complication: Secondary | ICD-10-CM

## 2021-01-28 DIAGNOSIS — I428 Other cardiomyopathies: Secondary | ICD-10-CM

## 2021-01-28 NOTE — Telephone Encounter (Signed)
Already addressed in phone note, I called pt last week and left her a message about this. 2mg  dose on national backorder still, 1mg  dose already refilled.

## 2021-01-28 NOTE — Telephone Encounter (Signed)
Pt aware that 2mg  dose is still on backorder and to continue on Ozempic 1mg  weekly, refill has been sent in.

## 2021-01-30 ENCOUNTER — Encounter: Payer: Self-pay | Admitting: Pharmacist

## 2021-01-30 MED ORDER — OZEMPIC (2 MG/DOSE) 8 MG/3ML ~~LOC~~ SOPN: 2.0000 mg | PEN_INJECTOR | SUBCUTANEOUS | 6 refills | Status: DC

## 2021-01-30 NOTE — Telephone Encounter (Signed)
Pt called saying the [facility] on [address] had ozempic mg. Said they had 2 of them. I sent in Rx

## 2021-01-30 NOTE — Telephone Encounter (Signed)
error 

## 2021-01-30 NOTE — Addendum Note (Signed)
Addended by: Marcelle Overlie D on: 01/30/2021 04:26 PM   Modules accepted: Orders

## 2021-02-16 NOTE — Progress Notes (Signed)
Advanced Heart Failure Clinic Note   PCP: Maury Dus, MD PCP-Cardiologist: Elouise Munroe, MD  HF Cardiology: Dr. Aundra Dubin   HPI: Alexandria Ruiz is a 65 y.o. with history of asthma, HTN, HLD, DMII, OA,  R TKR & LTKR, OSA and recently diagnosed systolic heart failure.   Evaluated by Dr Margaretann Loveless for dyspnea in 2021. Had myoview and ECHO that showed reduced EF, 35-40%. She was set up for [facility] on 09/27/19 that showed normal cors, markedly elevated filling pressures, and low cardiac output, CI 1.3. Suspect flash pulmonary edema.  She was hypertensive in the cath lab so nitroglycerin drip was started. She was admitted, placed on milrinone and diuresed w/ IV Lasix. Cause of cardiomyopathy felt to be hypertension. She was diuresed and started on GDMT. Milrinone weaned off w/ stable Co-ox (73%).  Echo in 10/21 showed EF up to 50%, mild LVH, mildly decreased RV systolic function, mild MR.  She had to stop Jardiance due to recurrent yeast infections.   Today she returns for HF follow up. Overall feeling fine. Limited physically by back pain. Does not have exertional dyspnea walking short distances on flat ground, but some SOB with stairs. Denies palpitations, abnormal bleeding, CP, dizziness, edema or PND/orthopnea. Appetite ok. No fever or chills. Weight at home 338 pounds. Taking all medications. She is using her CPAP. She works full time as a Careers information officer.  ECG (personally reviewed): NSR   Labs (7/21): K 4, creatinine 1.09 Labs (9/21): K 4.2, creatinine 1.09 Labs (10/21): K 4, creatinine 1.17 Labs (9/22): K 4.2, creatinine 1.08 Labs (10/22): K 4.6, creatinine 1.04  Review of Systems: All systems reviewed and negative except as per HPI.   PMH: 1. Type 2 diabetes.  2. GERD 3. HTN 4. Hyperlipidemia 5. OSA: CPAP.  6. OA with bilateral TKRs.  7. Asthma 8. Chronic systolic CHF: Nonischemic cardiomyopathy.  - Echo (6/21): EF 35-40%, global HK, moderate LV dilation, RV normal,  -  LHC/RHC (7/21): No coronary disease.  Mean RA 35, PA 70/44, mean PCWP 46, CI 1.33.  - Echo (10/21): EF up to 50%, mild LVH, mildly decreased RV systolic function, mild MR. 9. Chronic LBP.  Current Outpatient Medications  Medication Sig Dispense Refill   ALPRAZolam (XANAX) 0.5 MG tablet Take 0.25 mg by mouth daily as needed for anxiety.      budesonide-formoterol (SYMBICORT) 160-4.5 MCG/ACT inhaler Inhale 2 puffs into the lungs 2 (two) times daily.     carvedilol (COREG) 6.25 MG tablet TAKE 1 TABLET BY MOUTH 2 TIMES DAILY WITH A MEAL. 180 tablet 3   DULoxetine (CYMBALTA) 60 MG capsule Take 60 mg by mouth 2 (two) times daily.     estradiol (ESTRACE) 1 MG tablet Take 1 mg by mouth at bedtime.     furosemide (LASIX) 40 MG tablet Take 1.5 tablets (60 mg total) by mouth daily. 135 tablet 3   JARDIANCE 10 MG TABS tablet TAKE 1 TABLET BY MOUTH DAILY BEFORE BREAKFAST. 90 tablet 3   potassium chloride SA (KLOR-CON) 20 MEQ tablet Take 1.5 tablets (30 mEq total) by mouth daily. 135 tablet 3   sacubitril-valsartan (ENTRESTO) 49-51 MG Take 1 tablet by mouth 2 (two) times daily. 180 tablet 3   Semaglutide, 2 MG/DOSE, (OZEMPIC, 2 MG/DOSE,) 8 MG/3ML SOPN Inject 2 mg into the skin once a week. 6 mL 6   simvastatin (ZOCOR) 40 MG tablet Take 40 mg by mouth daily at 6 PM.     spironolactone (ALDACTONE) 25 MG tablet  Take 1 tablet (25 mg total) by mouth daily. 90 tablet 3   sucralfate (CARAFATE) 1 g tablet Take 1 g by mouth as needed.     valACYclovir (VALTREX) 500 MG tablet Take 500 mg by mouth daily.     No current facility-administered medications for this encounter.   Allergies  Allergen Reactions   Hydrocodone Itching   Oxycodone Itching   Social History   Socioeconomic History   Marital status: Married    Spouse name: Not on file   Number of children: Not on file   Years of education: Not on file   Highest education level: Not on file  Occupational History   Not on file  Tobacco Use   Smoking  status: Former    Packs/day: 0.50    Years: 30.00    Pack years: 15.00    Types: Cigarettes    Quit date: 10/29/2006    Years since quitting: 14.3   Smokeless tobacco: Never  Vaping Use   Vaping Use: Never used  Substance and Sexual Activity   Alcohol use: No   Drug use: No   Sexual activity: Not Currently  Other Topics Concern   Not on file  Social History Narrative   Not on file   Social Determinants of Health   Financial Resource Strain: Not on file  Food Insecurity: Not on file  Transportation Needs: Not on file  Physical Activity: Not on file  Stress: Not on file  Social Connections: Not on file  Intimate Partner Violence: Not on file   Family History  Adopted: Yes   BP 108/70   Pulse 87   Wt (!) 154.9 kg (341 lb 9.6 oz)   SpO2 97%   BMI 47.64 kg/m   Wt Readings from Last 3 Encounters:  02/17/21 (!) 154.9 kg (341 lb 9.6 oz)  12/25/20 (!) 158.7 kg (349 lb 12.8 oz)  12/16/20 (!) 157.6 kg (347 lb 6.4 oz)   PHYSICAL EXAM: General:  NAD. No resp difficulty HEENT: Normal Neck: Supple. No JVD. Carotids 2+ bilat; no bruits. No lymphadenopathy or thryomegaly appreciated. Cor: PMI nondisplaced. Regular rate & rhythm. No rubs, gallops or murmurs. Lungs: Clear Abdomen: Obese, nontender, nondistended. No hepatosplenomegaly. No bruits or masses. Good bowel sounds. Extremities: No cyanosis, clubbing, rash, edema Neuro: Alert & oriented x 3, cranial nerves grossly intact. Moves all 4 extremities w/o difficulty. Affect pleasant.  ASSESSMENT & PLAN: 1. Chronic Systolic CHF: Nonischemic cardiomyopathy.  Echo (6/21) showed EF 35-40%, [facility] 7/21 demonstrated markedly elevated filling pressures and low CI 1.3. Normal coronaries. Suspect hypertensive cardiomyopathy (markedly hypertensive at time of cath requiring NTG gtt). She had marked improvement rapidly with BP lowering and diuresis. Suspect she had flash pulmonary edema in the cath lab due to hypertensive emergency. Required  milrinone to help w/ CO and help w/ diuresis. Echo in 1021 showed EF up to 50%. Stable NYHA class II symptoms.  She does not appear volume overloaded today, weight down 8 lbs.  - Continue Jardiance 10 mg daily. (Previously off due to yeast infections, watch closely for GU symptoms. None thus far).  - Continue Entresto 49/51 mg bid.  - Continue Lasix 60 mg daily and KCl 30 mEq daily.  BMET today.  - Continue spironolactone 25 mg daily. - Continue Coreg 6.25 mg bid.  - She is out of ICD range.  2. HTN: Controlled on current meds.  3. OSA: Continue CPAP. She reports full compliance  4. Obesity: Weight trending down. Body mass  index is 47.64 kg/m. - Continue semaglutide, she is down 8 lbs.  Recommend followup in 3 months with Dr. Aundra Dubin, however patient prefers 6 months, I think this is OK. She will call is she feels she needs to be seen sooner.  [place], FNP 02/17/21

## 2021-02-17 ENCOUNTER — Encounter (HOSPITAL_COMMUNITY): Payer: Self-pay

## 2021-02-17 ENCOUNTER — Other Ambulatory Visit: Payer: Self-pay

## 2021-02-17 ENCOUNTER — Ambulatory Visit (HOSPITAL_COMMUNITY)
Admission: RE | Admit: 2021-02-17 | Discharge: 2021-02-17 | Disposition: A | Discharge status: Home / Self Care | Payer: 59 | Source: Ambulatory Visit | Attending: Family Medicine | Admitting: Family Medicine

## 2021-02-17 VITALS — BP 108/70 | HR 87 | Wt 341.6 lb

## 2021-02-17 DIAGNOSIS — Z7951 Long term (current) use of inhaled steroids: Secondary | ICD-10-CM | POA: Insufficient documentation

## 2021-02-17 DIAGNOSIS — I10 Essential (primary) hypertension: Secondary | ICD-10-CM

## 2021-02-17 DIAGNOSIS — Z96653 Presence of artificial knee joint, bilateral: Secondary | ICD-10-CM | POA: Diagnosis not present

## 2021-02-17 DIAGNOSIS — G4733 Obstructive sleep apnea (adult) (pediatric): Secondary | ICD-10-CM | POA: Insufficient documentation

## 2021-02-17 DIAGNOSIS — Z6841 Body Mass Index (BMI) 40.0 and over, adult: Secondary | ICD-10-CM | POA: Diagnosis not present

## 2021-02-17 DIAGNOSIS — Z7984 Long term (current) use of oral hypoglycemic drugs: Secondary | ICD-10-CM | POA: Diagnosis not present

## 2021-02-17 DIAGNOSIS — I110 Hypertensive heart disease with heart failure: Secondary | ICD-10-CM | POA: Diagnosis present

## 2021-02-17 DIAGNOSIS — E119 Type 2 diabetes mellitus without complications: Secondary | ICD-10-CM | POA: Diagnosis not present

## 2021-02-17 DIAGNOSIS — Z7985 Long-term (current) use of injectable non-insulin antidiabetic drugs: Secondary | ICD-10-CM | POA: Insufficient documentation

## 2021-02-17 DIAGNOSIS — E669 Obesity, unspecified: Secondary | ICD-10-CM | POA: Diagnosis not present

## 2021-02-17 DIAGNOSIS — M549 Dorsalgia, unspecified: Secondary | ICD-10-CM | POA: Diagnosis not present

## 2021-02-17 DIAGNOSIS — I5022 Chronic systolic (congestive) heart failure: Secondary | ICD-10-CM | POA: Diagnosis not present

## 2021-02-17 DIAGNOSIS — Z79899 Other long term (current) drug therapy: Secondary | ICD-10-CM | POA: Insufficient documentation

## 2021-02-17 DIAGNOSIS — E785 Hyperlipidemia, unspecified: Secondary | ICD-10-CM | POA: Insufficient documentation

## 2021-02-17 DIAGNOSIS — Z9989 Dependence on other enabling machines and devices: Secondary | ICD-10-CM | POA: Insufficient documentation

## 2021-02-17 LAB — BASIC METABOLIC PANEL
Anion gap: 11 (ref 5–15)
BUN: 14 mg/dL (ref 8–23)
CO2: 24 mmol/L (ref 22–32)
Calcium: 9.4 mg/dL (ref 8.9–10.3)
Chloride: 100 mmol/L (ref 98–111)
Creatinine, Ser: 1 mg/dL (ref 0.44–1.00)
GFR, Estimated: >60 mL/min (ref >60)
Glucose, Bld: 97 mg/dL (ref 70–99)
Potassium: 4.1 mmol/L (ref 3.5–5.1)
Sodium: 135 mmol/L (ref 135–145)

## 2021-02-17 NOTE — Patient Instructions (Addendum)
It was great to see you today! No medication changes are needed at this time.  Labs today We will only contact you if something comes back abnormal or we need to make some changes. Otherwise no news is good news!  Your physician recommends that you schedule a follow-up appointment in: 6 months with Dr Aundra Dubin  Do the following things EVERYDAY: Weigh yourself in the morning before breakfast. Write it down and keep it in a log. Take your medicines as prescribed Eat low salt foods--Limit salt (sodium) to 2000 mg per day.  Stay as active as you can everyday Limit all fluids for the day to less than 2 liters  At the [facility], you and your health needs are our priority. As part of our continuing mission to provide you with exceptional heart care, we have created designated Provider Care Teams. These Care Teams include your primary Cardiologist (physician) and Advanced Practice Providers (APPs- Physician Assistants and Nurse Practitioners) who all work together to provide you with the care you need, when you need it.   You may see any of the following providers on your designated Care Team at your next follow up: Dr Glori Bickers Dr Haynes Kerns, NP Lyda Jester, [place] [facility] [place], [place] Audry Riles, PharmD   Please be sure to bring in all your medications bottles to every appointment.

## 2021-03-17 ENCOUNTER — Telehealth: Payer: Self-pay | Admitting: Pharmacist

## 2021-03-17 NOTE — Telephone Encounter (Addendum)
Pt using Ozempic off label for weight loss since her insurance wouldn't cover Wegov[employer] last year. Tried submitting new PA for [facility] for 2023 year to see if insurance covers it now per pt request.

## 2021-03-17 NOTE — Telephone Encounter (Signed)
Prior auth for [employer] denied, her insurance still does not cover weight loss meds for 2023 year. Pt should continue on Ozempic. Left message for pt with this update.

## 2021-04-22 ENCOUNTER — Encounter (HOSPITAL_COMMUNITY): Payer: Self-pay | Admitting: Cardiology

## 2021-07-08 ENCOUNTER — Ambulatory Visit (HOSPITAL_COMMUNITY)
Admission: EM | Admit: 2021-07-08 | Discharge: 2021-07-08 | Disposition: A | Discharge status: Home / Self Care | Payer: 59

## 2021-09-01 NOTE — Progress Notes (Signed)
Advanced Heart Failure Clinic Note   PCP: Maury Dus, MD PCP-Cardiologist: Elouise Munroe, MD  HF Cardiology: Dr. Aundra Dubin   HPI: Ms Duba is a 66 y.o. with history of asthma, HTN, HLD, DMII, OA,  R TKR & LTKR, OSA and recently diagnosed systolic heart failure.   Evaluated by Dr Margaretann Loveless for dyspnea in 2021. Had myoview and ECHO that showed reduced EF, 35-40%. She was set up for [facility] on 09/27/19 that showed normal cors, markedly elevated filling pressures, and low cardiac output, CI 1.3. Suspect flash pulmonary edema.  She was hypertensive in the cath lab so nitroglycerin drip was started. She was admitted, placed on milrinone and diuresed w/ IV Lasix. Cause of cardiomyopathy felt to be hypertension. She was diuresed and started on GDMT. Milrinone weaned off w/ stable Co-ox (73%).  Echo in 10/21 showed EF up to 50%, mild LVH, mildly decreased RV systolic function, mild MR.  She had to stop Jardiance due to recurrent yeast infections.   Today she returns for HF follow up. Overall feeling fine. Limited physically by back pain. Does not have exertional dyspnea walking short distances on flat ground, but some SOB with stairs. Denies palpitations, abnormal bleeding, CP, dizziness, edema or PND/orthopnea. Appetite ok. No fever or chills. Weight at home 338 pounds. Taking all medications. She is using her CPAP. She works full time as a Careers information officer.  ECG (personally reviewed): NSR   Labs (7/21): K 4, creatinine 1.09 Labs (9/21): K 4.2, creatinine 1.09 Labs (10/21): K 4, creatinine 1.17 Labs (9/22): K 4.2, creatinine 1.08 Labs (10/22): K 4.6, creatinine 1.04  Review of Systems: All systems reviewed and negative except as per HPI.   PMH: 1. Type 2 diabetes.  2. GERD 3. HTN 4. Hyperlipidemia 5. OSA: CPAP.  6. OA with bilateral TKRs.  7. Asthma 8. Chronic systolic CHF: Nonischemic cardiomyopathy.  - Echo (6/21): EF 35-40%, global HK, moderate LV dilation, RV normal,  -  LHC/RHC (7/21): No coronary disease.  Mean RA 35, PA 70/44, mean PCWP 46, CI 1.33.  - Echo (10/21): EF up to 50%, mild LVH, mildly decreased RV systolic function, mild MR. 9. Chronic LBP.  Current Outpatient Medications  Medication Sig Dispense Refill   ALPRAZolam (XANAX) 0.5 MG tablet Take 0.25 mg by mouth daily as needed for anxiety.      budesonide-formoterol (SYMBICORT) 160-4.5 MCG/ACT inhaler Inhale 2 puffs into the lungs 2 (two) times daily.     carvedilol (COREG) 6.25 MG tablet TAKE 1 TABLET BY MOUTH 2 TIMES DAILY WITH A MEAL. 180 tablet 3   DULoxetine (CYMBALTA) 60 MG capsule Take 60 mg by mouth 2 (two) times daily.     estradiol (ESTRACE) 1 MG tablet Take 1 mg by mouth at bedtime.     furosemide (LASIX) 40 MG tablet Take 1.5 tablets (60 mg total) by mouth daily. 135 tablet 3   JARDIANCE 10 MG TABS tablet TAKE 1 TABLET BY MOUTH DAILY BEFORE BREAKFAST. 90 tablet 3   potassium chloride SA (KLOR-CON) 20 MEQ tablet Take 1.5 tablets (30 mEq total) by mouth daily. 135 tablet 3   sacubitril-valsartan (ENTRESTO) 49-51 MG Take 1 tablet by mouth 2 (two) times daily. 180 tablet 3   Semaglutide, 2 MG/DOSE, (OZEMPIC, 2 MG/DOSE,) 8 MG/3ML SOPN Inject 2 mg into the skin once a week. 6 mL 6   simvastatin (ZOCOR) 40 MG tablet Take 40 mg by mouth daily at 6 PM.     spironolactone (ALDACTONE) 25 MG tablet  Take 1 tablet (25 mg total) by mouth daily. 90 tablet 3   sucralfate (CARAFATE) 1 g tablet Take 1 g by mouth as needed.     valACYclovir (VALTREX) 500 MG tablet Take 500 mg by mouth daily.     No current facility-administered medications for this visit.   Allergies  Allergen Reactions   Hydrocodone Itching   Oxycodone Itching   Social History   Socioeconomic History   Marital status: Married    Spouse name: Not on file   Number of children: Not on file   Years of education: Not on file   Highest education level: Not on file  Occupational History   Not on file  Tobacco Use   Smoking  status: Former    Packs/day: 0.50    Years: 30.00    Total pack years: 15.00    Types: Cigarettes    Quit date: 10/29/2006    Years since quitting: 14.8   Smokeless tobacco: Never  Vaping Use   Vaping Use: Never used  Substance and Sexual Activity   Alcohol use: No   Drug use: No   Sexual activity: Not Currently  Other Topics Concern   Not on file  Social History Narrative   Not on file   Social Determinants of Health   Financial Resource Strain: Not on file  Food Insecurity: Not on file  Transportation Needs: Not on file  Physical Activity: Not on file  Stress: Not on file  Social Connections: Not on file  Intimate Partner Violence: Not on file   Family History  Adopted: Yes   There were no vitals taken for this visit.  Wt Readings from Last 3 Encounters:  02/17/21 (!) 154.9 kg (341 lb 9.6 oz)  12/25/20 (!) 158.7 kg (349 lb 12.8 oz)  12/16/20 (!) 157.6 kg (347 lb 6.4 oz)   PHYSICAL EXAM: General:  NAD. No resp difficulty HEENT: Normal Neck: Supple. No JVD. Carotids 2+ bilat; no bruits. No lymphadenopathy or thryomegaly appreciated. Cor: PMI nondisplaced. Regular rate & rhythm. No rubs, gallops or murmurs. Lungs: Clear Abdomen: Obese, nontender, nondistended. No hepatosplenomegaly. No bruits or masses. Good bowel sounds. Extremities: No cyanosis, clubbing, rash, edema Neuro: Alert & oriented x 3, cranial nerves grossly intact. Moves all 4 extremities w/o difficulty. Affect pleasant.  ASSESSMENT & PLAN: 1. Chronic Systolic CHF: Nonischemic cardiomyopathy.  Echo (6/21) showed EF 35-40%, [facility] 7/21 demonstrated markedly elevated filling pressures and low CI 1.3. Normal coronaries. Suspect hypertensive cardiomyopathy (markedly hypertensive at time of cath requiring NTG gtt). She had marked improvement rapidly with BP lowering and diuresis. Suspect she had flash pulmonary edema in the cath lab due to hypertensive emergency. Required milrinone to help w/ CO and help w/  diuresis. Echo in 1021 showed EF up to 50%. Stable NYHA class II symptoms.  She does not appear volume overloaded today, weight down 8 lbs.  - Continue Jardiance 10 mg daily. (Previously off due to yeast infections, watch closely for GU symptoms. None thus far).  - Continue Entresto 49/51 mg bid.  - Continue Lasix 60 mg daily and KCl 30 mEq daily.  BMET today.  - Continue spironolactone 25 mg daily. - Continue Coreg 6.25 mg bid.  - She is out of ICD range.  2. HTN: Controlled on current meds.  3. OSA: Continue CPAP. She reports full compliance  4. Obesity: Weight trending down. There is no height or weight on file to calculate BMI. - Continue semaglutide, she is down 8 lbs.  Recommend followup in 3 months with Dr. Aundra Dubin, however patient prefers 6 months, I think this is OK. She will call is she feels she needs to be seen sooner.  [place], FNP 09/01/21

## 2021-09-02 ENCOUNTER — Encounter (HOSPITAL_COMMUNITY): Payer: Self-pay

## 2021-09-02 ENCOUNTER — Ambulatory Visit (HOSPITAL_COMMUNITY)
Admission: RE | Admit: 2021-09-02 | Discharge: 2021-09-02 | Disposition: A | Discharge status: Home / Self Care | Payer: 59 | Source: Ambulatory Visit | Attending: Family Medicine | Admitting: Family Medicine

## 2021-09-02 VITALS — BP 110/70 | HR 96 | Wt 320.4 lb

## 2021-09-02 DIAGNOSIS — Z7901 Long term (current) use of anticoagulants: Secondary | ICD-10-CM | POA: Insufficient documentation

## 2021-09-02 DIAGNOSIS — J45909 Unspecified asthma, uncomplicated: Secondary | ICD-10-CM | POA: Diagnosis not present

## 2021-09-02 DIAGNOSIS — I110 Hypertensive heart disease with heart failure: Secondary | ICD-10-CM | POA: Insufficient documentation

## 2021-09-02 DIAGNOSIS — E785 Hyperlipidemia, unspecified: Secondary | ICD-10-CM | POA: Diagnosis not present

## 2021-09-02 DIAGNOSIS — Z6841 Body Mass Index (BMI) 40.0 and over, adult: Secondary | ICD-10-CM | POA: Insufficient documentation

## 2021-09-02 DIAGNOSIS — Z9989 Dependence on other enabling machines and devices: Secondary | ICD-10-CM | POA: Diagnosis not present

## 2021-09-02 DIAGNOSIS — I10 Essential (primary) hypertension: Secondary | ICD-10-CM

## 2021-09-02 DIAGNOSIS — Z79899 Other long term (current) drug therapy: Secondary | ICD-10-CM | POA: Diagnosis not present

## 2021-09-02 DIAGNOSIS — Z794 Long term (current) use of insulin: Secondary | ICD-10-CM | POA: Diagnosis not present

## 2021-09-02 DIAGNOSIS — G4733 Obstructive sleep apnea (adult) (pediatric): Secondary | ICD-10-CM | POA: Insufficient documentation

## 2021-09-02 DIAGNOSIS — I5042 Chronic combined systolic (congestive) and diastolic (congestive) heart failure: Secondary | ICD-10-CM | POA: Insufficient documentation

## 2021-09-02 DIAGNOSIS — I428 Other cardiomyopathies: Secondary | ICD-10-CM | POA: Insufficient documentation

## 2021-09-02 DIAGNOSIS — E119 Type 2 diabetes mellitus without complications: Secondary | ICD-10-CM | POA: Insufficient documentation

## 2021-09-02 DIAGNOSIS — M25511 Pain in right shoulder: Secondary | ICD-10-CM | POA: Diagnosis not present

## 2021-09-02 DIAGNOSIS — E669 Obesity, unspecified: Secondary | ICD-10-CM | POA: Insufficient documentation

## 2021-09-02 DIAGNOSIS — M1990 Unspecified osteoarthritis, unspecified site: Secondary | ICD-10-CM | POA: Diagnosis not present

## 2021-09-02 LAB — BASIC METABOLIC PANEL
Anion gap: 13 (ref 5–15)
BUN: 23 mg/dL (ref 8–23)
CO2: 19 mmol/L — ABNORMAL LOW (ref 22–32)
Calcium: 9.8 mg/dL (ref 8.9–10.3)
Chloride: 102 mmol/L (ref 98–111)
Creatinine, Ser: 1.46 mg/dL — ABNORMAL HIGH (ref 0.44–1.00)
GFR, Estimated: 40 mL/min — ABNORMAL LOW (ref >60)
Glucose, Bld: 126 mg/dL — ABNORMAL HIGH (ref 70–99)
Potassium: 5.2 mmol/L — ABNORMAL HIGH (ref 3.5–5.1)
Sodium: 134 mmol/L — ABNORMAL LOW (ref 135–145)

## 2021-09-02 NOTE — Patient Instructions (Signed)
Good to see you today!  No  change to your medications  Labs done today, your results will be available in MyChart, we will contact you for abnormal readings.  Your physician has requested that you have an echocardiogram. Echocardiography is a painless test that uses sound waves to create images of your heart. It provides your doctor with information about the size and shape of your heart and how well your heart's chambers and valves are working. This procedure takes approximately one hour. There are no restrictions for this procedure.  Your physician recommends that you schedule a follow-up appointment  in 4 months. Call in August to schedule an appointment for  October .  If you have any questions or concerns before your next appointment please send [place] a message through [place] or call our office at [phone].    TO LEAVE A MESSAGE FOR THE NURSE SELECT OPTION 2, PLEASE LEAVE A MESSAGE INCLUDING: YOUR NAME DATE OF BIRTH CALL BACK NUMBER REASON FOR CALL**this is important as we prioritize the call backs  YOU WILL RECEIVE A CALL BACK THE SAME DAY AS LONG AS YOU CALL BEFORE 4:00 PM  At the [facility], you and your health needs are our priority. As part of our continuing mission to provide you with exceptional heart care, we have created designated Provider Care Teams. These Care Teams include your primary Cardiologist (physician) and Advanced Practice Providers (APPs- Physician Assistants and Nurse Practitioners) who all work together to provide you with the care you need, when you need it.   You may see any of the following providers on your designated Care Team at your next follow up: Dr Glori Bickers Dr Haynes Kerns, NP Lyda Jester, [place] [facility] [place], [place] Audry Riles, PharmD   Please be sure to bring in all your medications bottles to every appointment.

## 2021-09-24 ENCOUNTER — Telehealth (HOSPITAL_COMMUNITY): Payer: Self-pay | Admitting: *Deleted

## 2021-09-24 NOTE — Telephone Encounter (Signed)
Pt left vm for return call I called pt back no answer/left vm for return call.

## 2021-09-30 ENCOUNTER — Telehealth: Payer: Self-pay | Admitting: *Deleted

## 2021-09-30 NOTE — Telephone Encounter (Signed)
   Pre-operative Risk Assessment    Patient Name: Alexandria Ruiz  DOB: November 02, 1955 MRN: [id number]      Request for Surgical Clearance    Procedure:   RIGHT REVERSE SHOULDER ARTHROPLASTY  Date of Surgery:  Clearance TBD                                 Surgeon:  DR. Lennette Bihari SUPPLE Surgeon's Group or Practice Name:  [facility] Phone number:  [phone] Fax number:  [phone] ATTN; [place]   Type of Clearance Requested:   - Medical , NO MEDICATIONS ARE LISTED AS NEEDING TO BE HELD   Type of Anesthesia:  General    Additional requests/questions:    Jiles Prows   09/30/2021, 5:40 PM

## 2021-10-02 NOTE — Telephone Encounter (Signed)
Request for clearance forwarded to [facility] [place], NP-C    10/02/2021, 11:38 AM [place] [address], [address] Office [phone] Fax [phone]

## 2021-10-05 NOTE — Telephone Encounter (Signed)
Get echo, if EF is stable and no new symptoms, she should be ok for surgery.

## 2021-10-05 NOTE — Telephone Encounter (Signed)
Will wait to see when [place] schedules the pt for echo.

## 2021-10-05 NOTE — Telephone Encounter (Signed)
Per Dr. Aundra Dubin he has requested pt to have an echo. He has sent a message to his scheduling team to set up echo. I will update only as an FYI the pre op provider on our end.

## 2021-10-06 ENCOUNTER — Encounter (HOSPITAL_COMMUNITY): Payer: Self-pay | Admitting: Cardiology

## 2021-10-06 NOTE — Telephone Encounter (Signed)
I will update the pre op provider as well as the surgeon office of echo date 10/15/21.

## 2021-10-15 ENCOUNTER — Ambulatory Visit (HOSPITAL_COMMUNITY)
Admission: RE | Admit: 2021-10-15 | Discharge: 2021-10-15 | Disposition: A | Discharge status: Home / Self Care | Payer: 59 | Source: Ambulatory Visit | Attending: Family Medicine | Admitting: Family Medicine

## 2021-10-15 DIAGNOSIS — E119 Type 2 diabetes mellitus without complications: Secondary | ICD-10-CM | POA: Diagnosis not present

## 2021-10-15 DIAGNOSIS — I509 Heart failure, unspecified: Secondary | ICD-10-CM | POA: Diagnosis not present

## 2021-10-15 DIAGNOSIS — E785 Hyperlipidemia, unspecified: Secondary | ICD-10-CM | POA: Diagnosis not present

## 2021-10-15 DIAGNOSIS — I5042 Chronic combined systolic (congestive) and diastolic (congestive) heart failure: Secondary | ICD-10-CM | POA: Insufficient documentation

## 2021-10-15 DIAGNOSIS — G4730 Sleep apnea, unspecified: Secondary | ICD-10-CM | POA: Diagnosis not present

## 2021-10-15 DIAGNOSIS — I110 Hypertensive heart disease with heart failure: Secondary | ICD-10-CM | POA: Diagnosis not present

## 2021-10-15 LAB — ECHOCARDIOGRAM COMPLETE
Area-P 1/2: 1.56 cm2
S' Lateral: 3.6 cm

## 2021-10-15 NOTE — Progress Notes (Signed)
  Echocardiogram 2D Echocardiogram has been performed.  Darlina Sicilian M 10/15/2021, 8:46 AM

## 2021-10-16 ENCOUNTER — Telehealth (HOSPITAL_COMMUNITY): Payer: Self-pay

## 2021-10-16 NOTE — Telephone Encounter (Signed)
   Patient Name: Alexandria Ruiz  DOB: [birth year] MRN: [id number]  Primary Cardiologist: Elouise Munroe, MD  Chart reviewed as part of pre-operative protocol coverage.   Call placed to evaluate patient for upcoming shoulder surgery.  Patient not available at this time detailed message left for patient to return call at earliest convenience.  [name], Marissa Nestle, NP 10/16/2021, 11:30 AM

## 2021-10-16 NOTE — Telephone Encounter (Signed)
Patient aware of echo results.

## 2021-10-16 NOTE — Telephone Encounter (Signed)
-----   Message from Rafael Bihari, [place] sent at 10/16/2021  8:25 AM EDT ----- EF 50%, stable from previous study.

## 2021-10-21 ENCOUNTER — Other Ambulatory Visit (HOSPITAL_COMMUNITY): Payer: Self-pay | Admitting: Cardiology

## 2021-12-09 NOTE — Progress Notes (Addendum)
Anesthesia Review: PCP- Alexandria Ruiz Clearance 10/16/21 on chart  LOV 5/22/23[name], NO on chart and 08/17/21 OV note  PCP: LOV 10/15/21 Alexandria Ruiz  Cardiologist : Alexandria Ruiz LOV 09/02/21  Chest x-ray : EKG :02/27/21  Echo : 10/15/21 on chart  Stress test:2021  Cardiac Cath : 2021  Activity level:  Sleep Study/ CPAP : Fasting Blood Sugar :      / Checks Blood Sugar -- times a day:   Blood Thinner/ Instructions /Last Dose: ASA / Instructions/ Last Dose :   DM- type  Hgba1c-  Ozempic  On 12/10/21 LVMM at 0804am, 0820am and 0835 am.  PT never called nor showed for preop appt.

## 2021-12-09 NOTE — Patient Instructions (Signed)
SURGICAL WAITING ROOM VISITATION Patients having surgery or a procedure may have no more than 2 support people in the waiting area - these visitors may rotate.   Children under the age of 61 must have an adult with them who is not the patient. If the patient needs to stay at the hospital during part of their recovery, the visitor guidelines for inpatient rooms apply. Pre-op nurse will coordinate an appropriate time for 1 support person to accompany patient in pre-op.  This support person may not rotate.    Please refer to the [facility] website for the visitor guidelines for Inpatients (after your surgery is over and you are in a regular room).       Your procedure is scheduled on:  12/17/2021    Report to [facility] Main Entrance    Report to admitting at   1000AM   Call this number if you have problems the morning of surgery [phone]   Do not eat food :After Midnight.   After Midnight you may have the following liquids until __ 0930____ AM  DAY OF SURGERY  Water Non-Citrus Juices (without pulp, NO RED) Carbonated Beverages Black Coffee (NO MILK/CREAM OR CREAMERS, sugar ok)  Clear Tea (NO MILK/CREAM OR CREAMERS, sugar ok) regular and decaf                             Plain Jell-O (NO RED)                                           Fruit ices (not with fruit pulp, NO RED)                                     Popsicles (NO RED)                                                               Sports drinks like Gatorade (NO RED)       The day of surgery:  Drink ONE (1) Pre-Surgery Clear Ensure or G2 at  0930 AM ( have completed by )  the morning of surgery. Drink in one sitting. Do not sip.  This drink was given to you during your hospital  pre-op appointment visit. Nothing else to drink after completing the  Pre-Surgery Clear Ensure or G2.          If you have questions, please contact your surgeon's office.   FOLLOW BOWEL PREP AND ANY ADDITIONAL PRE OP  INSTRUCTIONS YOU RECEIVED FROM YOUR SURGEON'S OFFICE!!!     Oral Hygiene is also important to reduce your risk of infection.                                    Remember - BRUSH YOUR TEETH THE MORNING OF SURGERY WITH YOUR REGULAR TOOTHPASTE   Do NOT smoke after Midnight   Take these medicines the morning of surgery with A SIP OF WATER:  inhalers as usual  and brin,g coreg, cymbalta   DO NOT TAKE ANY ORAL DIABETIC MEDICATIONS DAY OF YOUR SURGERY  Bring CPAP mask and tubing day of surgery.                              You may not have any metal on your body including hair pins, jewelry, and body piercing             Do not wear make-up, lotions, powders, perfumes/cologne, or deodorant  Do not wear nail polish including gel and S&S, artificial/acrylic nails, or any other type of covering on natural nails including finger and toenails. If you have artificial nails, gel coating, etc. that needs to be removed by a nail salon please have this removed prior to surgery or surgery may need to be canceled/ delayed if the surgeon/ anesthesia feels like they are unable to be safely monitored.   Do not shave  48 hours prior to surgery.               Men may shave face and neck.   Do not bring valuables to the hospital. [place].   Contacts, dentures or bridgework may not be worn into surgery.   Bring small overnight bag day of surgery.   DO NOT [place]. PHARMACY WILL DISPENSE MEDICATIONS LISTED ON YOUR MEDICATION LIST TO YOU DURING YOUR ADMISSION [place]!    Patients discharged on the day of surgery will not be allowed to drive home.  Someone NEEDS to stay with you for the first 24 hours after anesthesia.   Special Instructions: Bring a copy of your healthcare power of attorney and living will documents the day of surgery if you haven't scanned them before.              Please read over the following  fact sheets you were given: IF [place] [phone]   If you received a COVID test during your pre-op visit  it is requested that you wear a mask when out in public, stay away from anyone that may not be feeling well and notify your surgeon if you develop symptoms. If you test positive for Covid or have been in contact with anyone that has tested positive in the last 10 days please notify you surgeon.     [facility] - Preparing for Surgery Before surgery, you can play an important role.  Because skin is not sterile, your skin needs to be as free of germs as possible.  You can reduce the number of germs on your skin by washing with CHG (chlorahexidine gluconate) soap before surgery.  CHG is an antiseptic cleaner which kills germs and bonds with the skin to continue killing germs even after washing. Please DO NOT use if you have an allergy to CHG or antibacterial soaps.  If your skin becomes reddened/irritated stop using the CHG and inform your nurse when you arrive at Short Stay. Do not shave (including legs and underarms) for at least 48 hours prior to the first CHG shower.  You may shave your face/neck. Please follow these instructions carefully:  1.  Shower with CHG Soap the night before surgery and the  morning of Surgery.  2.  If you choose to wash your hair,  wash your hair first as usual with your  normal  shampoo.  3.  After you shampoo, rinse your hair and body thoroughly to remove the  shampoo.                           4.  Use CHG as you would any other liquid soap.  You can apply chg directly  to the skin and wash                       Gently with a scrungie or clean washcloth.  5.  Apply the CHG Soap to your body ONLY FROM THE NECK DOWN.   Do not use on face/ open                           Wound or open sores. Avoid contact with eyes, ears mouth and genitals (private parts).                       Wash face,  Genitals (private parts)  with your normal soap.             6.  Wash thoroughly, paying special attention to the area where your surgery  will be performed.  7.  Thoroughly rinse your body with warm water from the neck down.  8.  DO NOT shower/wash with your normal soap after using and rinsing off  the CHG Soap.                9.  Pat yourself dry with a clean towel.            10.  Wear clean pajamas.            11.  Place clean sheets on your bed the night of your first shower and do not  sleep with pets. Day of Surgery : Do not apply any lotions/deodorants the morning of surgery.  Please wear clean clothes to the hospital/surgery center.  FAILURE TO FOLLOW THESE INSTRUCTIONS MAY RESULT IN THE CANCELLATION OF YOUR SURGERY PATIENT SIGNATURE_________________________________  NURSE SIGNATURE__________________________________  ________________________________________________________________________ [facility]- Preparing for Total Shoulder Arthroplasty    Before surgery, you can play an important role. Because skin is not sterile, your skin needs to be as free of germs as possible. You can reduce the number of germs on your skin by using the following products. Benzoyl Peroxide Gel Reduces the number of germs present on the skin Applied twice a day to shoulder area starting two days before surgery    ==================================================================  Please follow these instructions carefully:  BENZOYL PEROXIDE 5% GEL  Please do not use if you have an allergy to benzoyl peroxide.   If your skin becomes reddened/irritated stop using the benzoyl peroxide.  Starting two days before surgery, apply as follows: Apply benzoyl peroxide in the morning and at night. Apply after taking a shower. If you are not taking a shower clean entire shoulder front, back, and side along with the armpit with a clean wet washcloth.  Place a quarter-sized dollop on your shoulder and rub in thoroughly, making sure to  cover the front, back, and side of your shoulder, along with the armpit.   2 days before ____ AM   ____ PM              1 day before ____ AM   ____  PM                         Do this twice a day for two days.  (Last application is the night before surgery, AFTER using the CHG soap as described below).  Do NOT apply benzoyl peroxide gel on the day of surgery.

## 2021-12-10 ENCOUNTER — Encounter (HOSPITAL_COMMUNITY)
Admission: RE | Admit: 2021-12-10 | Discharge: 2021-12-10 | Disposition: A | Discharge status: Home / Self Care | Payer: 59 | Source: Ambulatory Visit | Attending: Orthopedic Surgery | Admitting: Orthopedic Surgery

## 2021-12-10 DIAGNOSIS — Z01818 Encounter for other preprocedural examination: Secondary | ICD-10-CM

## 2021-12-12 ENCOUNTER — Other Ambulatory Visit (HOSPITAL_COMMUNITY): Payer: Self-pay | Admitting: Cardiology

## 2021-12-18 ENCOUNTER — Other Ambulatory Visit (HOSPITAL_COMMUNITY): Payer: Self-pay | Admitting: Cardiology

## 2022-01-10 ENCOUNTER — Encounter (HOSPITAL_COMMUNITY): Payer: Self-pay | Admitting: Cardiology

## 2022-01-27 NOTE — Patient Instructions (Signed)
DUE TO COVID-19 ONLY TWO VISITORS  (aged 64 and older)  ARE ALLOWED TO COME WITH YOU AND STAY IN THE WAITING ROOM ONLY DURING PRE OP AND PROCEDURE.   **NO VISITORS ARE ALLOWED IN THE SHORT STAY AREA OR RECOVERY ROOM!!**  IF YOU WILL BE ADMITTED INTO THE HOSPITAL YOU ARE ALLOWED ONLY FOUR SUPPORT PEOPLE DURING VISITATION HOURS ONLY (7 AM -8PM)   The support person(s) must pass our screening, gel in and out, and wear a mask at all times, including in the patient's room. Patients must also wear a mask when staff or their support person are in the room. Visitors GUEST BADGE MUST BE WORN VISIBLY  One adult visitor may remain with you overnight and MUST be in the room by 8 P.M.     Your procedure is scheduled on: 02/11/22   Report to [facility] Main Entrance    Report to admitting at : 5:15 AM   Call this number if you have problems the morning of surgery [phone]   Do not eat food :After Midnight.   After Midnight you may have the following liquids until : 4:30 AM DAY OF SURGERY  Water Black Coffee (sugar ok, NO MILK/CREAM OR CREAMERS)  Tea (sugar ok, NO MILK/CREAM OR CREAMERS) regular and decaf                             Plain Jell-O (NO RED)                                           Fruit ices (not with fruit pulp, NO RED)                                     Popsicles (NO RED)                                                                  Juice: apple, WHITE grape, WHITE cranberry Sports drinks like Gatorade (NO RED)              The day of surgery:  Drink ONE (1) Pre-Surgery Clear Ensure or G2 at: 4:30 AM the morning of surgery. Drink in one sitting. Do not sip.  This drink was given to you during your hospital  pre-op appointment visit. Nothing else to drink after completing the  Pre-Surgery Clear Ensure or G2.          If you have questions, please contact your surgeon's office.   Oral Hygiene is also important to reduce your risk of infection.                                     Remember - BRUSH YOUR TEETH THE MORNING OF SURGERY WITH YOUR REGULAR TOOTHPASTE   Do NOT smoke after Midnight   Take these medicines the morning of surgery with A SIP OF WATER: DULOXETINE,CARVEDILOL,ENTRESTO.ALPRAZOLAM AS NEEDED.INHALERS AS USUAL. How to Manage Your Diabetes Before and  After Surgery  Why is it important to control my blood sugar before and after surgery? Improving blood sugar levels before and after surgery helps healing and can limit problems. A way of improving blood sugar control is eating a healthy diet by:  Eating less sugar and carbohydrates  Increasing activity/exercise  Talking with your doctor about reaching your blood sugar goals High blood sugars (greater than 180 mg/dL) can raise your risk of infections and slow your recovery, so you will need to focus on controlling your diabetes during the weeks before surgery. Make sure that the doctor who takes care of your diabetes knows about your planned surgery including the date and location.  How do I manage my blood sugar before surgery? Check your blood sugar at least 4 times a day, starting 2 days before surgery, to make sure that the level is not too high or low. Check your blood sugar the morning of your surgery when you wake up and every 2 hours until you get to the Short Stay unit. If your blood sugar is less than 70 mg/dL, you will need to treat for low blood sugar: Do not take insulin. Treat a low blood sugar (less than 70 mg/dL) with  cup of clear juice (cranberry or apple), 4 glucose tablets, OR glucose gel. Recheck blood sugar in 15 minutes after treatment (to make sure it is greater than 70 mg/dL). If your blood sugar is not greater than 70 mg/dL on recheck, call [phone] for further instructions. Report your blood sugar to the short stay nurse when you get to Short Stay.  If you are admitted to the hospital after surgery: Your blood sugar will be checked by the staff and you will  probably be given insulin after surgery (instead of oral diabetes medicines) to make sure you have good blood sugar levels. The goal for blood sugar control after surgery is 80-180 mg/dL.   WHAT DO I DO ABOUT MY DIABETES MEDICATION?  Do not take oral diabetes medicines (pills) the morning of surgery.  THE NIGHT BEFORE SURGERY, take     units of       insulin.       THE MORNING OF SURGERY, take   units of         insulin.  DO NOT TAKE THE FOLLOWING 7 DAYS PRIOR TO SURGERY: Ozempic, Wegovy, Rybelsus (Semaglutide), Byetta (exenatide), Bydureon (exenatide ER), Victoza, Saxenda (liraglutide), or Trulicity (dulaglutide) Mounjaro (Tirzepatide) Adlyxin (Lixisenatide), Polyethylene Glycol Loxenatide.  DO NOT TAKE ANY ORAL DIABETIC MEDICATIONS DAY OF YOUR SURGERY  Bring CPAP mask and tubing day of surgery.                              You may not have any metal on your body including hair pins, jewelry, and body piercing             Do not wear make-up, lotions, powders, perfumes/cologne, or deodorant  Do not wear nail polish including gel and S&S, artificial/acrylic nails, or any other type of covering on natural nails including finger and toenails. If you have artificial nails, gel coating, etc. that needs to be removed by a nail salon please have this removed prior to surgery or surgery may need to be canceled/ delayed if the surgeon/ anesthesia feels like they are unable to be safely monitored.   Do not shave  48 hours prior to surgery.    Do not bring valuables  to the hospital. [place].   Contacts, dentures or bridgework may not be worn into surgery.   Bring small overnight bag day of surgery.   DO NOT [place]. PHARMACY WILL DISPENSE MEDICATIONS LISTED ON YOUR MEDICATION LIST TO YOU DURING YOUR ADMISSION [place]!    Patients discharged on the day of surgery will not be allowed to drive home.   Someone NEEDS to stay with you for the first 24 hours after anesthesia.   Special Instructions: Bring a copy of your healthcare power of attorney and living will documents         the day of surgery if you haven't scanned them before.              Please read over the following fact sheets you were given: IF YOU HAVE QUESTIONS ABOUT YOUR PRE-OP INSTRUCTIONS PLEASE CALL [phone]    [facility] - Preparing for Surgery Before surgery, you can play an important role.  Because skin is not sterile, your skin needs to be as free of germs as possible.  You can reduce the number of germs on your skin by washing with CHG (chlorahexidine gluconate) soap before surgery.  CHG is an antiseptic cleaner which kills germs and bonds with the skin to continue killing germs even after washing. Please DO NOT use if you have an allergy to CHG or antibacterial soaps.  If your skin becomes reddened/irritated stop using the CHG and inform your nurse when you arrive at Short Stay. Do not shave (including legs and underarms) for at least 48 hours prior to the first CHG shower.  You may shave your face/neck. Please follow these instructions carefully:  1.  Shower with CHG Soap the night before surgery and the  morning of Surgery.  2.  If you choose to wash your hair, wash your hair first as usual with your  normal  shampoo.  3.  After you shampoo, rinse your hair and body thoroughly to remove the  shampoo.                           4.  Use CHG as you would any other liquid soap.  You can apply chg directly  to the skin and wash                       Gently with a scrungie or clean washcloth.  5.  Apply the CHG Soap to your body ONLY FROM THE NECK DOWN.   Do not use on face/ open                           Wound or open sores. Avoid contact with eyes, ears mouth and genitals (private parts).                       Wash face,  Genitals (private parts) with your normal soap.             6.  Wash thoroughly, paying special  attention to the area where your surgery  will be performed.  7.  Thoroughly rinse your body with warm water from the neck down.  8.  DO NOT shower/wash with your normal soap after using and rinsing off  the CHG Soap.                9.  Pat yourself dry with a clean towel.            10.  Wear clean pajamas.            11.  Place clean sheets on your bed the night of your first shower and do not  sleep with pets. Day of Surgery : Do not apply any lotions/deodorants the morning of surgery.  Please wear clean clothes to the hospital/surgery center.  FAILURE TO FOLLOW THESE INSTRUCTIONS MAY RESULT IN THE CANCELLATION OF YOUR SURGERY PATIENT SIGNATURE_________________________________  NURSE SIGNATURE__________________________________  ________________________________________________________________________  Adam [place]  An incentive spirometer is a tool that can help keep your lungs clear and active. This tool measures how well you are filling your lungs with each breath. Taking long deep breaths may help reverse or decrease the chance of developing breathing (pulmonary) problems (especially infection) following: A long period of time when you are unable to move or be active. BEFORE THE PROCEDURE  If the spirometer includes an indicator to show your best effort, your nurse or respiratory therapist will set it to a desired goal. If possible, sit up straight or lean slightly forward. Try not to slouch. Hold the incentive spirometer in an upright position. INSTRUCTIONS FOR USE  Sit on the edge of your bed if possible, or sit up as far as you can in bed or on a chair. Hold the incentive spirometer in an upright position. Breathe out normally. Place the mouthpiece in your mouth and seal your lips tightly around it. Breathe in slowly and as deeply as possible, raising the piston or the ball toward the top of the column. Hold your breath for 3-5 seconds or for as long as possible. Allow  the piston or ball to fall to the bottom of the column. Remove the mouthpiece from your mouth and breathe out normally. Rest for a few seconds and repeat Steps 1 through 7 at least 10 times every 1-2 hours when you are awake. Take your time and take a few normal breaths between deep breaths. The spirometer may include an indicator to show your best effort. Use the indicator as a goal to work toward during each repetition. After each set of 10 deep breaths, practice coughing to be sure your lungs are clear. If you have an incision (the cut made at the time of surgery), support your incision when coughing by placing a pillow or rolled up towels firmly against it. Once you are able to get out of bed, walk around indoors and cough well. You may stop using the incentive spirometer when instructed by your caregiver.  RISKS AND COMPLICATIONS Take your time so you do not get dizzy or light-headed. If you are in pain, you may need to take or ask for pain medication before doing incentive spirometry. It is harder to take a deep breath if you are having pain. AFTER USE Rest and breathe slowly and easily. It can be helpful to keep track of a log of your progress. Your caregiver can provide you with a simple table to help with this. If you are using the spirometer at home, follow these instructions: [place] IF:  You are having difficultly using the spirometer. You have trouble using the spirometer as often as instructed. Your pain medication is not giving enough relief while using the spirometer. You develop fever of 100.5 F (38.1  C) or higher. SEEK IMMEDIATE MEDICAL CARE IF:  You cough up bloody sputum that had not been present before. You develop fever of 102 F (38.9 C) or greater. You develop worsening pain at or near the incision site. MAKE SURE YOU:  Understand these instructions. Will watch your condition. Will get help right away if you are not doing well or get worse. Document  Released: 07/12/2006 Document Revised: 05/24/2011 Document Reviewed: 09/12/2006 [facility] Patient Information 2014 [place], [place].   ________________________________________________________________________

## 2022-01-28 ENCOUNTER — Encounter (HOSPITAL_COMMUNITY)
Admission: RE | Admit: 2022-01-28 | Discharge: 2022-01-28 | Disposition: A | Discharge status: Home / Self Care | Payer: 59 | Source: Ambulatory Visit | Attending: Family Medicine | Admitting: Family Medicine

## 2022-01-28 NOTE — Progress Notes (Signed)
Pt. Did not show up to PST appointment today.After several attempts to reach pt. Over the phone,there were not answer.

## 2022-02-09 NOTE — Progress Notes (Signed)
COVID Vaccine Completed:  Yes  Date of COVID positive in last 90 days:  PCP - Maury Dus, MD Cardiologist - Esther Hardy, MD Pulmonologist - Simonne Maffucci, MD (last OV 2018)  Chest x-ray -  EKG - 02-17-21 Epic Stress Test - 2021 Epic ECHO - 10-15-21 Epic Cardiac Cath - 09-27-19 Epic Pacemaker/ICD device last checked: Spinal Cord Stimulator:  Bowel Prep -   Sleep Study - Yes, +sleep apnea CPAP -   Fasting Blood Sugar -  Checks Blood Sugar _____ times a day  Ozempic Last dose of GLP1 agonist-  N/A GLP1 instructions:  N/A   Last dose of SGLT-2 inhibitors-  N/A SGLT-2 instructions: N/A   Blood Thinner Instructions: Aspirin Instructions: Last Dose:  Activity level:  Can go up a flight of stairs and perform activities of daily living without stopping and without symptoms of chest pain or shortness of breath.  Able to exercise without symptoms  Unable to go up a flight of stairs without symptoms of     Anesthesia review:  LVH, CHF, cardiomyopathy, HTN, OSA, DM  Patient denies shortness of breath, fever, cough and chest pain at PAT appointment  Patient verbalized understanding of instructions that were given to them at the PAT appointment. Patient was also instructed that they will need to review over the PAT instructions again at home before surgery.

## 2022-02-09 NOTE — Patient Instructions (Signed)
SURGICAL WAITING ROOM VISITATION Patients having surgery or a procedure may have no more than 2 support people in the waiting area - these visitors may rotate.   Children under the age of 73 must have an adult with them who is not the patient. If the patient needs to stay at the hospital during part of their recovery, the visitor guidelines for inpatient rooms apply. Pre-op nurse will coordinate an appropriate time for 1 support person to accompany patient in pre-op.  This support person may not rotate.    Please refer to the [facility] website for the visitor guidelines for Inpatients (after your surgery is over and you are in a regular room).      Your procedure is scheduled on: 02-11-22   Report to [facility] Main Entrance    Report to admitting at 5:15 AM   Call this number if you have problems the morning of surgery [phone]   Do not eat food :After Midnight.   After Midnight you may have the following liquids until 4:30 AM DAY OF SURGERY  Water Non-Citrus Juices (without pulp, NO RED) Carbonated Beverages Black Coffee (NO MILK/CREAM OR CREAMERS, sugar ok)  Clear Tea (NO MILK/CREAM OR CREAMERS, sugar ok) regular and decaf                             Plain Jell-O (NO RED)                                           Fruit ices (not with fruit pulp, NO RED)                                     Popsicles (NO RED)                                                               Sports drinks like Gatorade (NO RED)                   The day of surgery:  Drink ONE (1) Pre-Surgery  G2 at 4:30 AM the morning of surgery. Drink in one sitting. Do not sip.  This drink was given to you during your hospital  pre-op appointment visit. Nothing else to drink after completing the Pre-Surgery G2.          If you have questions, please contact your surgeon's office.   FOLLOW ANY ADDITIONAL PRE OP INSTRUCTIONS YOU RECEIVED FROM YOUR SURGEON'S OFFICE!!!     Oral Hygiene is also  important to reduce your risk of infection.                                    Remember - BRUSH YOUR TEETH THE MORNING OF SURGERY WITH YOUR REGULAR TOOTHPASTE   Do NOT smoke after Midnight   Take these medicines the morning of surgery with A SIP OF WATER:   Atorvastatin  Duloxetine  Carvedilol  Hydroxyzine  Alprazolam if needed  Okay to use inhalers  How to Manage Your Diabetes Before and After Surgery  Why is it important to control my blood sugar before and after surgery? Improving blood sugar levels before and after surgery helps healing and can limit problems. A way of improving blood sugar control is eating a healthy diet by:  Eating less sugar and carbohydrates  Increasing activity/exercise  Talking with your doctor about reaching your blood sugar goals High blood sugars (greater than 180 mg/dL) can raise your risk of infections and slow your recovery, so you will need to focus on controlling your diabetes during the weeks before surgery. Make sure that the doctor who takes care of your diabetes knows about your planned surgery including the date and location.  How do I manage my blood sugar before surgery? Check your blood sugar at least 4 times a day, starting 2 days before surgery, to make sure that the level is not too high or low. Check your blood sugar the morning of your surgery when you wake up and every 2 hours until you get to the Short Stay unit. If your blood sugar is less than 70 mg/dL, you will need to treat for low blood sugar: Do not take insulin. Treat a low blood sugar (less than 70 mg/dL) with  cup of clear juice (cranberry or apple), 4 glucose tablets, OR glucose gel. Recheck blood sugar in 15 minutes after treatment (to make sure it is greater than 70 mg/dL). If your blood sugar is not greater than 70 mg/dL on recheck, call [phone] for further instructions. Report your blood sugar to the short stay nurse when you get to Short Stay.  If you are  admitted to the hospital after surgery: Your blood sugar will be checked by the staff and you will probably be given insulin after surgery (instead of oral diabetes medicines) to make sure you have good blood sugar levels. The goal for blood sugar control after surgery is 80-180 mg/dL.   WHAT DO I DO ABOUT MY DIABETES MEDICATION?  Do not take oral diabetes medicines (pills) the morning of surgery.        Hold Trulicity 7 days before surgery (do not take after 02/03/22)  DO NOT TAKE THE FOLLOWING 7 DAYS PRIOR TO SURGERY: Ozempic, Wegovy, Rybelsus (Semaglutide), Byetta (exenatide), Bydureon (exenatide ER), Victoza, Saxenda (liraglutide), or Trulicity (dulaglutide) Mounjaro (Tirzepatide) Adlyxin (Lixisenatide), Polyethylene Glycol Loxenatide.  Reviewed and Endorsed by [facility] Patient Education Committee, August 2015  Bring CPAP mask and tubing day of surgery.                              You may not have any metal on your body including hair pins, jewelry, and body piercing             Do not wear make-up, lotions, powders, perfumes or deodorant  Do not wear nail polish including gel and S&S, artificial/acrylic nails, or any other type of covering on natural nails including finger and toenails. If you have artificial nails, gel coating, etc. that needs to be removed by a nail salon please have this removed prior to surgery or surgery may need to be canceled/ delayed if the surgeon/ anesthesia feels like they are unable to be safely monitored.   Do not shave  48 hours prior to surgery.            Do not bring valuables to the hospital. [facility]  IS NOT RESPONSIBLE   FOR VALUABLES.   Contacts, dentures or bridgework may not be worn into surgery.  DO NOT [place]. PHARMACY WILL DISPENSE MEDICATIONS LISTED ON YOUR MEDICATION LIST TO YOU DURING YOUR ADMISSION [place]!    Patients discharged on the day of surgery will not be allowed to drive  home.  Someone NEEDS to stay with you for the first 24 hours after anesthesia.               Please read over the following fact sheets you were given: IF [place]  If you received a COVID test during your pre-op visit  it is requested that you wear a mask when out in public, stay away from anyone that may not be feeling well and notify your surgeon if you develop symptoms. If you test positive for Covid or have been in contact with anyone that has tested positive in the last 10 days please notify you surgeon.  [place]- Preparing for Total Shoulder Arthroplasty    Before surgery, you can play an important role. Because skin is not sterile, your skin needs to be as free of germs as possible. You can reduce the number of germs on your skin by using the following products. Benzoyl Peroxide Gel Reduces the number of germs present on the skin Applied twice a day to shoulder area starting two days before surgery    ==================================================================  Please follow these instructions carefully:  BENZOYL PEROXIDE 5% GEL  Please do not use if you have an allergy to benzoyl peroxide.   If your skin becomes reddened/irritated stop using the benzoyl peroxide.  Starting two days before surgery, apply as follows: Apply benzoyl peroxide in the morning and at night. Apply after taking a shower. If you are not taking a shower clean entire shoulder front, back, and side along with the armpit with a clean wet washcloth.  Place a quarter-sized dollop on your shoulder and rub in thoroughly, making sure to cover the front, back, and side of your shoulder, along with the armpit.   2 days before ____ AM   ____ PM              1 day before ____ AM   ____ PM                         Do this twice a day for two days.  (Last application is the night before surgery, AFTER using the CHG soap as described  below).  Do NOT apply benzoyl peroxide gel on the day of surgery.  [place] - Preparing for Surgery Before surgery, you can play an important role.  Because skin is not sterile, your skin needs to be as free of germs as possible.  You can reduce the number of germs on your skin by washing with CHG (chlorahexidine gluconate) soap before surgery.  CHG is an antiseptic cleaner which kills germs and bonds with the skin to continue killing germs even after washing. Please DO NOT use if you have an allergy to CHG or antibacterial soaps.  If your skin becomes reddened/irritated stop using the CHG and inform your nurse when you arrive at Short Stay. Do not shave (including legs and underarms) for at least 48 hours prior to the first CHG shower.  You may shave your face/neck.  Please follow these instructions  carefully:  1.  Shower with CHG Soap the night before surgery and the  morning of surgery.  2.  If you choose to wash your hair, wash your hair first as usual with your normal  shampoo.  3.  After you shampoo, rinse your hair and body thoroughly to remove the shampoo.                             4.  Use CHG as you would any other liquid soap.  You can apply chg directly to the skin and wash.  Gently with a scrungie or clean washcloth.  5.  Apply the CHG Soap to your body ONLY FROM THE NECK DOWN.   Do   not use on face/ open                           Wound or open sores. Avoid contact with eyes, ears mouth and   genitals (private parts).                       Wash face,  Genitals (private parts) with your normal soap.             6.  Wash thoroughly, paying special attention to the area where your    surgery  will be performed.  7.  Thoroughly rinse your body with warm water from the neck down.  8.  DO NOT shower/wash with your normal soap after using and rinsing off the CHG Soap.                9.  Pat yourself dry with a clean towel.            10.  Wear clean pajamas.            11.  Place  clean sheets on your bed the night of your first shower and do not  sleep with pets. Day of Surgery : Do not apply any lotions/deodorants the morning of surgery.  Please wear clean clothes to the hospital/surgery center.  FAILURE TO FOLLOW THESE INSTRUCTIONS MAY RESULT IN THE CANCELLATION OF YOUR SURGERY  PATIENT SIGNATURE_________________________________  NURSE SIGNATURE__________________________________  ________________________________________________________________________    [place]andria Ruiz  An incentive spirometer is a tool that can help keep your lungs clear and active. This tool measures how well you are filling your lungs with each breath. Taking long deep breaths may help reverse or decrease the chance of developing breathing (pulmonary) problems (especially infection) following: A long period of time when you are unable to move or be active. BEFORE THE PROCEDURE  If the spirometer includes an indicator to show your best effort, your nurse or respiratory therapist will set it to a desired goal. If possible, sit up straight or lean slightly forward. Try not to slouch. Hold the incentive spirometer in an upright position. INSTRUCTIONS FOR USE  Sit on the edge of your bed if possible, or sit up as far as you can in bed or on a chair. Hold the incentive spirometer in an upright position. Breathe out normally. Place the mouthpiece in your mouth and seal your lips tightly around it. Breathe in slowly and as deeply as possible, raising the piston or the ball toward the top of the column. Hold your breath for 3-5 seconds or for as long as possible. Allow the piston or ball to fall to the  bottom of the column. Remove the mouthpiece from your mouth and breathe out normally. Rest for a few seconds and repeat Steps 1 through 7 at least 10 times every 1-2 hours when you are awake. Take your time and take a few normal breaths between deep breaths. The spirometer may include an  indicator to show your best effort. Use the indicator as a goal to work toward during each repetition. After each set of 10 deep breaths, practice coughing to be sure your lungs are clear. If you have an incision (the cut made at the time of surgery), support your incision when coughing by placing a pillow or rolled up towels firmly against it. Once you are able to get out of bed, walk around indoors and cough well. You may stop using the incentive spirometer when instructed by your caregiver.  RISKS AND COMPLICATIONS Take your time so you do not get dizzy or light-headed. If you are in pain, you may need to take or ask for pain medication before doing incentive spirometry. It is harder to take a deep breath if you are having pain. AFTER USE Rest and breathe slowly and easily. It can be helpful to keep track of a log of your progress. Your caregiver can provide you with a simple table to help with this. If you are using the spirometer at home, follow these instructions: [place] IF:  You are having difficultly using the spirometer. You have trouble using the spirometer as often as instructed. Your pain medication is not giving enough relief while using the spirometer. You develop fever of 100.5 F (38.1 C) or higher. SEEK IMMEDIATE MEDICAL CARE IF:  You cough up bloody sputum that had not been present before. You develop fever of 102 F (38.9 C) or greater. You develop worsening pain at or near the incision site. MAKE SURE YOU:  Understand these instructions. Will watch your condition. Will get help right away if you are not doing well or get worse. Document Released: 07/12/2006 Document Revised: 05/24/2011 Document Reviewed: 09/12/2006 [facility] Patient Information 2014 [place], [place].   ________________________________________________________________________

## 2022-02-10 ENCOUNTER — Encounter (HOSPITAL_COMMUNITY)
Admission: RE | Admit: 2022-02-10 | Discharge: 2022-02-10 | Disposition: A | Discharge status: Home / Self Care | Payer: 59 | Source: Ambulatory Visit | Attending: Orthopedic Surgery | Admitting: Orthopedic Surgery

## 2022-02-10 ENCOUNTER — Other Ambulatory Visit: Payer: Self-pay

## 2022-02-10 ENCOUNTER — Encounter (HOSPITAL_COMMUNITY): Payer: Self-pay

## 2022-02-10 DIAGNOSIS — G4730 Sleep apnea, unspecified: Secondary | ICD-10-CM | POA: Diagnosis not present

## 2022-02-10 DIAGNOSIS — Z01818 Encounter for other preprocedural examination: Secondary | ICD-10-CM

## 2022-02-10 DIAGNOSIS — Z87891 Personal history of nicotine dependence: Secondary | ICD-10-CM | POA: Insufficient documentation

## 2022-02-10 DIAGNOSIS — I10 Essential (primary) hypertension: Secondary | ICD-10-CM | POA: Insufficient documentation

## 2022-02-10 DIAGNOSIS — E119 Type 2 diabetes mellitus without complications: Secondary | ICD-10-CM | POA: Insufficient documentation

## 2022-02-10 DIAGNOSIS — M75101 Unspecified rotator cuff tear or rupture of right shoulder, not specified as traumatic: Secondary | ICD-10-CM | POA: Diagnosis not present

## 2022-02-10 DIAGNOSIS — Z01812 Encounter for preprocedural laboratory examination: Secondary | ICD-10-CM | POA: Diagnosis present

## 2022-02-10 HISTORY — DX: Depression, unspecified: F32.A

## 2022-02-10 HISTORY — DX: Unspecified macular degeneration: H35.30

## 2022-02-10 LAB — GLUCOSE, CAPILLARY: Glucose-Capillary: 165 mg/dL — ABNORMAL HIGH (ref 70–99)

## 2022-02-10 LAB — SURGICAL PCR SCREEN
MRSA, PCR: NEGATIVE
Staphylococcus aureus: NEGATIVE

## 2022-02-10 NOTE — Anesthesia Preprocedure Evaluation (Signed)
Anesthesia Evaluation  Patient identified by MRN, date of birth, ID band Patient awake    Reviewed: Allergy & Precautions, H&P , NPO status , Patient's Chart, lab work & pertinent test results  History of Anesthesia Complications (+) PONV and history of anesthetic complications  Airway Mallampati: II  TM Distance: >3 FB Neck ROM: Full    Dental no notable dental hx.    Pulmonary shortness of breath, asthma , sleep apnea , former smoker   Pulmonary exam normal breath sounds clear to auscultation       Cardiovascular hypertension, Pt. on medications +CHF  Normal cardiovascular exam Rhythm:Regular Rate:Normal     Neuro/Psych   Anxiety Depression     Neuromuscular disease  negative psych ROS   GI/Hepatic Neg liver ROS,GERD  ,,  Endo/Other  diabetes, Type 2  Morbid obesity  Renal/GU negative Renal ROS  negative genitourinary   Musculoskeletal  (+) Arthritis , Osteoarthritis,    Abdominal  (+) + obese  Peds negative pediatric ROS (+)  Hematology  (+) Blood dyscrasia, anemia   Anesthesia Other Findings   Reproductive/Obstetrics negative OB ROS                             Anesthesia Physical Anesthesia Plan  ASA: 3  Anesthesia Plan: General   Post-op Pain Management: Regional block* and Minimal or no pain anticipated   Induction: Intravenous  PONV Risk Score and Plan: 4 or greater and Ondansetron, Dexamethasone, Midazolam, Droperidol and Treatment may vary due to age or medical condition  Airway Management Planned: Oral ETT  Additional Equipment:   Intra-op Plan:   Post-operative Plan: Extubation in OR  Informed Consent: I have reviewed the patients History and Physical, chart, labs and discussed the procedure including the risks, benefits and alternatives for the proposed anesthesia with the patient or authorized representative who has indicated his/her understanding and  acceptance.     Dental advisory given  Plan Discussed with: CRNA  Anesthesia Plan Comments: (See PAT note 02/10/2022)       Anesthesia Quick Evaluation

## 2022-02-10 NOTE — Progress Notes (Signed)
Anesthesia Chart Review   Case: [id number] Date/Time: 02/11/22 0715   Procedure: REVERSE SHOULDER ARTHROPLASTY (Right: Shoulder)   Anesthesia type: General   Pre-op diagnosis: Right shoulder rotator cuff tear arthropathy   Location: [facility] ROOM 06 / [facility]   Surgeons: Justice Britain, MD       DISCUSSION:66 y.o. former smoker with h/o PONV, HTN, sleep apnea w/CPAP, DM II (A1C 6.0 02/02/2022), right shoulder rotator cuff tear scheduled for above procedure 02/11/2022 with Dr. Justice Britain.   Per cardiologist Dr. Aundra Dubin, "Get echo, if EF is stable and no new symptoms, she should be ok for surgery. "  Echo 10/15/2021, EF 50%, stable, no valvular problems.   Anticipate pt can proceed with planned procedure barring acute status change.   VS: BP 120/78   Pulse 93   Temp 36.9 C (Oral)   Resp 16   Ht '5\' 11"'$  (1.803 m)   Wt (!) 141.5 kg   SpO2 98%   BMI 43.52 kg/m   PROVIDERS: Maury Dus, MD is PCP   Primary Cardiologist: Elouise Munroe, MD  LABS: Labs reviewed: Acceptable for surgery. (all labs ordered are listed, but only abnormal results are displayed)  Labs Reviewed  GLUCOSE, CAPILLARY - Abnormal; Notable for the following components:      Result Value   Glucose-Capillary 165 (*)    All other components within normal limits  SURGICAL PCR SCREEN     IMAGES:   EKG:   CV: Echo 10/15/2021  1. Left ventricular ejection fraction, by estimation, is 50%. The left  ventricle has low normal function. The left ventricle has no regional wall  motion abnormalities. Left ventricular diastolic parameters are consistent  with Grade I diastolic  dysfunction (impaired relaxation).   2. Right ventricular systolic function was not well visualized. The right  ventricular size is normal. Tricuspid regurgitation signal is inadequate  for assessing PA pressure.   3. The mitral valve was not well visualized. No evidence of mitral valve  regurgitation. No evidence of mitral stenosis.   4.  The aortic valve was not well visualized. Aortic valve regurgitation  is not visualized. No aortic stenosis is present.  Past Medical History:  Diagnosis Date   Abdominal pain    Allergic rhinitis    Anemia    Anxiety    Arthritis    Asthma    Bronchitis    Depression    Diabetes mellitus    type 2   GERD (gastroesophageal reflux disease)    Hyperlipidemia    Hypertension    Macular degeneration    R eye   Obesity    Peripheral neuropathy    denies has carpal tunnel   PONV (postoperative nausea and vomiting)    Shortness of breath dyspnea    since starting Trulicity diabetic med   Sleep apnea    uses c-pap machine    Past Surgical History:  Procedure Laterality Date   ABDOMINAL HYSTERECTOMY     BREAST REDUCTION SURGERY     HAMMER TOE SURGERY     bilateral feet   JOINT REPLACEMENT     R TKA Dr. Wynelle Link 07-25-17   NOSE SURGERY     sinus surgery   ORIF ANKLE FRACTURE  06/29/2011   Procedure: OPEN REDUCTION INTERNAL FIXATION (ORIF) ANKLE FRACTURE;  Surgeon: Sharmon Revere, MD;  Location: [facility];  Service: Orthopedics;  Laterality: Right;   right rotator cuff surgery - 11-2013     RIGHT/LEFT HEART CATH AND CORONARY ANGIOGRAPHY  N/A 09/27/2019   Procedure: RIGHT/LEFT HEART CATH AND CORONARY ANGIOGRAPHY;  Surgeon: [place], Peter M, MD;  Location: [facility];  Service: Cardiovascular;  Laterality: N/A;   STERIOD INJECTION Right 06/23/2015   Procedure: RIGHT KNEE CORTISONE INJECTION;  Surgeon: Gaynelle Arabian, MD;  Location: [facility];  Service: Orthopedics;  Laterality: Right;   TOTAL KNEE ARTHROPLASTY Left 06/23/2015   Procedure: TOTAL LEFT KNEE ARTHROPLASTY;  Surgeon: Gaynelle Arabian, MD;  Location: [facility];  Service: Orthopedics;  Laterality: Left;   TOTAL KNEE ARTHROPLASTY Right 07/25/2017   Procedure: RIGHT TOTAL KNEE ARTHROPLASTY;  Surgeon: Gaynelle Arabian, MD;  Location: [facility];  Service: Orthopedics;  Laterality: Right;  Adductor Block    MEDICATIONS:  ALPRAZolam (XANAX)  0.5 MG tablet   atorvastatin (LIPITOR) 20 MG tablet   budesonide-formoterol (SYMBICORT) 160-4.5 MCG/ACT inhaler   carvedilol (COREG) 6.25 MG tablet   Dulaglutide (TRULICITY) 1.5 NT/7.0YF SOPN   DULoxetine (CYMBALTA) 60 MG capsule   estradiol (ESTRACE) 1 MG tablet   Eszopiclone 3 MG TABS   furosemide (LASIX) 40 MG tablet   hydrOXYzine (ATARAX) 25 MG tablet   levocetirizine (XYZAL) 5 MG tablet   potassium chloride SA (KLOR-CON) 20 MEQ tablet   sacubitril-valsartan (ENTRESTO) 49-51 MG   Semaglutide, 2 MG/DOSE, (OZEMPIC, 2 MG/DOSE,) 8 MG/3ML SOPN   spironolactone (ALDACTONE) 25 MG tablet   valACYclovir (VALTREX) 500 MG tablet   No current facility-administered medications for this encounter.    Konrad Felix Ward, PA-C [facility] Pre-Surgical Testing [phone]

## 2022-02-11 ENCOUNTER — Other Ambulatory Visit: Payer: Self-pay

## 2022-02-11 ENCOUNTER — Encounter (HOSPITAL_COMMUNITY): Payer: Self-pay | Admitting: Orthopedic Surgery

## 2022-02-11 ENCOUNTER — Ambulatory Visit (HOSPITAL_COMMUNITY): Payer: 59 | Admitting: Certified Registered"

## 2022-02-11 ENCOUNTER — Ambulatory Visit (HOSPITAL_BASED_OUTPATIENT_CLINIC_OR_DEPARTMENT_OTHER): Payer: 59 | Admitting: Certified Registered"

## 2022-02-11 ENCOUNTER — Encounter (HOSPITAL_COMMUNITY)
Admission: RE | Disposition: A | Discharge status: Home / Self Care | Payer: Self-pay | Source: Home / Self Care | Attending: Orthopedic Surgery

## 2022-02-11 ENCOUNTER — Ambulatory Visit (HOSPITAL_COMMUNITY)
Admission: RE | Admit: 2022-02-11 | Discharge: 2022-02-11 | Disposition: A | Discharge status: Home / Self Care | Payer: 59 | Attending: Orthopedic Surgery | Admitting: Orthopedic Surgery

## 2022-02-11 DIAGNOSIS — Z7985 Long-term (current) use of injectable non-insulin antidiabetic drugs: Secondary | ICD-10-CM | POA: Diagnosis not present

## 2022-02-11 DIAGNOSIS — G4730 Sleep apnea, unspecified: Secondary | ICD-10-CM | POA: Insufficient documentation

## 2022-02-11 DIAGNOSIS — M75101 Unspecified rotator cuff tear or rupture of right shoulder, not specified as traumatic: Secondary | ICD-10-CM | POA: Insufficient documentation

## 2022-02-11 DIAGNOSIS — I110 Hypertensive heart disease with heart failure: Secondary | ICD-10-CM

## 2022-02-11 DIAGNOSIS — F419 Anxiety disorder, unspecified: Secondary | ICD-10-CM | POA: Insufficient documentation

## 2022-02-11 DIAGNOSIS — M129 Arthropathy, unspecified: Secondary | ICD-10-CM | POA: Insufficient documentation

## 2022-02-11 DIAGNOSIS — E119 Type 2 diabetes mellitus without complications: Secondary | ICD-10-CM | POA: Diagnosis not present

## 2022-02-11 DIAGNOSIS — J45909 Unspecified asthma, uncomplicated: Secondary | ICD-10-CM | POA: Insufficient documentation

## 2022-02-11 DIAGNOSIS — Z79899 Other long term (current) drug therapy: Secondary | ICD-10-CM | POA: Diagnosis not present

## 2022-02-11 DIAGNOSIS — Z87891 Personal history of nicotine dependence: Secondary | ICD-10-CM

## 2022-02-11 DIAGNOSIS — I509 Heart failure, unspecified: Secondary | ICD-10-CM | POA: Insufficient documentation

## 2022-02-11 DIAGNOSIS — M12811 Other specific arthropathies, not elsewhere classified, right shoulder: Secondary | ICD-10-CM | POA: Diagnosis not present

## 2022-02-11 DIAGNOSIS — F32A Depression, unspecified: Secondary | ICD-10-CM | POA: Insufficient documentation

## 2022-02-11 DIAGNOSIS — Z6841 Body Mass Index (BMI) 40.0 and over, adult: Secondary | ICD-10-CM | POA: Insufficient documentation

## 2022-02-11 DIAGNOSIS — Z01818 Encounter for other preprocedural examination: Secondary | ICD-10-CM

## 2022-02-11 HISTORY — PX: REVERSE SHOULDER ARTHROPLASTY: SHX5054

## 2022-02-11 LAB — GLUCOSE, CAPILLARY
Glucose-Capillary: 125 mg/dL — ABNORMAL HIGH (ref 70–99)
Glucose-Capillary: 138 mg/dL — ABNORMAL HIGH (ref 70–99)

## 2022-02-11 SURGERY — ARTHROPLASTY, SHOULDER, TOTAL, REVERSE
Anesthesia: General | Site: Shoulder | Laterality: Right

## 2022-02-11 MED ORDER — PHENYLEPHRINE 80 MCG/ML (10ML) SYRINGE FOR IV PUSH (FOR BLOOD PRESSURE SUPPORT)
PREFILLED_SYRINGE | INTRAVENOUS | Status: AC
  Filled 2022-02-11: qty 10

## 2022-02-11 MED ORDER — ONDANSETRON HCL 4 MG PO TABS: 4.0000 mg | ORAL_TABLET | Freq: Three times a day (TID) | ORAL | 0 refills | Status: AC | PRN

## 2022-02-11 MED ORDER — LACTATED RINGERS IV SOLN: INTRAVENOUS | Status: DC

## 2022-02-11 MED ORDER — PHENYLEPHRINE 80 MCG/ML (10ML) SYRINGE FOR IV PUSH (FOR BLOOD PRESSURE SUPPORT)
PREFILLED_SYRINGE | INTRAVENOUS | Status: DC | PRN
  Administered 2022-02-11 (×4): 80 ug via INTRAVENOUS

## 2022-02-11 MED ORDER — VANCOMYCIN HCL 1000 MG IV SOLR
INTRAVENOUS | Status: DC | PRN
  Administered 2022-02-11: 1000 mg via TOPICAL

## 2022-02-11 MED ORDER — SCOPOLAMINE 1 MG/3DAYS TD PT72
MEDICATED_PATCH | TRANSDERMAL | Status: AC
  Filled 2022-02-11: qty 1

## 2022-02-11 MED ORDER — ONDANSETRON HCL 4 MG/2ML IJ SOLN
INTRAMUSCULAR | Status: DC | PRN
  Administered 2022-02-11: 4 mg via INTRAVENOUS

## 2022-02-11 MED ORDER — CHLORHEXIDINE GLUCONATE 0.12 % MT SOLN
15.0000 mL | Freq: Once | OROMUCOSAL | Status: AC
  Administered 2022-02-11: 15 mL via OROMUCOSAL

## 2022-02-11 MED ORDER — PROMETHAZINE HCL 25 MG/ML IJ SOLN: 6.2500 mg | INTRAMUSCULAR | Status: DC | PRN

## 2022-02-11 MED ORDER — MIDAZOLAM HCL 2 MG/2ML IJ SOLN
INTRAMUSCULAR | Status: AC
  Filled 2022-02-11: qty 2

## 2022-02-11 MED ORDER — ORAL CARE MOUTH RINSE: 15.0000 mL | Freq: Once | OROMUCOSAL | Status: AC

## 2022-02-11 MED ORDER — DEXAMETHASONE SODIUM PHOSPHATE 10 MG/ML IJ SOLN
INTRAMUSCULAR | Status: DC | PRN
  Administered 2022-02-11: 4 mg via INTRAVENOUS

## 2022-02-11 MED ORDER — PROPOFOL 10 MG/ML IV BOLUS
INTRAVENOUS | Status: AC
  Filled 2022-02-11: qty 20

## 2022-02-11 MED ORDER — TRANEXAMIC ACID-NACL 1000-0.7 MG/100ML-% IV SOLN
1000.0000 mg | INTRAVENOUS | Status: AC
  Administered 2022-02-11: 1000 mg via INTRAVENOUS
  Filled 2022-02-11: qty 100

## 2022-02-11 MED ORDER — TRANEXAMIC ACID 1000 MG/10ML IV SOLN: 1000.0000 mg | INTRAVENOUS | Status: DC

## 2022-02-11 MED ORDER — ROCURONIUM BROMIDE 10 MG/ML (PF) SYRINGE
PREFILLED_SYRINGE | INTRAVENOUS | Status: DC | PRN
  Administered 2022-02-11: 20 mg via INTRAVENOUS
  Administered 2022-02-11: 100 mg via INTRAVENOUS

## 2022-02-11 MED ORDER — VANCOMYCIN HCL 1000 MG IV SOLR
INTRAVENOUS | Status: AC
  Filled 2022-02-11: qty 20

## 2022-02-11 MED ORDER — CYCLOBENZAPRINE HCL 10 MG PO TABS: 10.0000 mg | ORAL_TABLET | Freq: Three times a day (TID) | ORAL | 1 refills | Status: AC | PRN

## 2022-02-11 MED ORDER — HYDROMORPHONE HCL 1 MG/ML IJ SOLN: 0.2500 mg | INTRAMUSCULAR | Status: DC | PRN

## 2022-02-11 MED ORDER — BUPIVACAINE LIPOSOME 1.3 % IJ SUSP
INTRAMUSCULAR | Status: DC | PRN
  Administered 2022-02-11: 10 mL via PERINEURAL

## 2022-02-11 MED ORDER — ROCURONIUM BROMIDE 10 MG/ML (PF) SYRINGE
PREFILLED_SYRINGE | INTRAVENOUS | Status: AC
  Filled 2022-02-11: qty 10

## 2022-02-11 MED ORDER — MIDAZOLAM HCL 2 MG/2ML IJ SOLN
INTRAMUSCULAR | Status: DC | PRN
  Administered 2022-02-11: 2 mg via INTRAVENOUS

## 2022-02-11 MED ORDER — BUPIVACAINE HCL (PF) 0.5 % IJ SOLN
INTRAMUSCULAR | Status: DC | PRN
  Administered 2022-02-11: 10 mL via PERINEURAL

## 2022-02-11 MED ORDER — 0.9 % SODIUM CHLORIDE (POUR BTL) OPTIME
TOPICAL | Status: DC | PRN
  Administered 2022-02-11: 1000 mL

## 2022-02-11 MED ORDER — SCOPOLAMINE 1 MG/3DAYS TD PT72
MEDICATED_PATCH | TRANSDERMAL | Status: DC | PRN
  Administered 2022-02-11: 1 via TRANSDERMAL

## 2022-02-11 MED ORDER — ONDANSETRON HCL 4 MG/2ML IJ SOLN
INTRAMUSCULAR | Status: AC
  Filled 2022-02-11: qty 2

## 2022-02-11 MED ORDER — OXYCODONE-ACETAMINOPHEN 5-325 MG PO TABS: 1.0000 | ORAL_TABLET | ORAL | 0 refills | Status: DC | PRN

## 2022-02-11 MED ORDER — LIDOCAINE 2% (20 MG/ML) 5 ML SYRINGE
INTRAMUSCULAR | Status: DC | PRN
  Administered 2022-02-11: 40 mg via INTRAVENOUS

## 2022-02-11 MED ORDER — MEPERIDINE HCL 50 MG/ML IJ SOLN: 6.2500 mg | INTRAMUSCULAR | Status: DC | PRN

## 2022-02-11 MED ORDER — STERILE WATER FOR IRRIGATION IR SOLN
Status: DC | PRN
  Administered 2022-02-11: 2000 mL

## 2022-02-11 MED ORDER — LIDOCAINE HCL (PF) 2 % IJ SOLN
INTRAMUSCULAR | Status: AC
  Filled 2022-02-11: qty 5

## 2022-02-11 MED ORDER — PROPOFOL 10 MG/ML IV BOLUS
INTRAVENOUS | Status: DC | PRN
  Administered 2022-02-11: 200 mg via INTRAVENOUS

## 2022-02-11 MED ORDER — DEXAMETHASONE SODIUM PHOSPHATE 10 MG/ML IJ SOLN
INTRAMUSCULAR | Status: AC
  Filled 2022-02-11: qty 1

## 2022-02-11 MED ORDER — CEFAZOLIN SODIUM-DEXTROSE 2-4 GM/100ML-% IV SOLN
2.0000 g | INTRAVENOUS | Status: AC
  Administered 2022-02-11: 2 g via INTRAVENOUS
  Filled 2022-02-11: qty 100

## 2022-02-11 MED ORDER — FENTANYL CITRATE (PF) 100 MCG/2ML IJ SOLN
INTRAMUSCULAR | Status: AC
  Filled 2022-02-11: qty 2

## 2022-02-11 MED ORDER — LACTATED RINGERS IV BOLUS
250.0000 mL | Freq: Once | INTRAVENOUS | Status: AC
  Administered 2022-02-11: 250 mL via INTRAVENOUS

## 2022-02-11 MED ORDER — FENTANYL CITRATE (PF) 100 MCG/2ML IJ SOLN
INTRAMUSCULAR | Status: DC | PRN
  Administered 2022-02-11 (×2): 50 ug via INTRAVENOUS

## 2022-02-11 MED ORDER — LACTATED RINGERS IV BOLUS
500.0000 mL | Freq: Once | INTRAVENOUS | Status: AC
  Administered 2022-02-11: 500 mL via INTRAVENOUS

## 2022-02-11 MED ORDER — PHENYLEPHRINE HCL-NACL 20-0.9 MG/250ML-% IV SOLN
INTRAVENOUS | Status: DC | PRN
  Administered 2022-02-11: 40 ug/min via INTRAVENOUS

## 2022-02-11 MED ORDER — SUGAMMADEX SODIUM 200 MG/2ML IV SOLN
INTRAVENOUS | Status: DC | PRN
  Administered 2022-02-11: 200 mg via INTRAVENOUS

## 2022-02-11 SURGICAL SUPPLY — 64 items
BAG COUNTER SPONGE SURGICOUNT (BAG) IMPLANT
BAG ZIPLOCK 12X15 (MISCELLANEOUS) ×1 IMPLANT
BLADE SAW SGTL 83.5X18.5 (BLADE) ×1 IMPLANT
BNDG COHESIVE 4X5 TAN STRL LF (GAUZE/BANDAGES/DRESSINGS) ×1 IMPLANT
COOLER ICEMAN CLASSIC (MISCELLANEOUS) ×1 IMPLANT
COVER BACK TABLE 60X90IN (DRAPES) ×1 IMPLANT
COVER SURGICAL LIGHT HANDLE (MISCELLANEOUS) ×1 IMPLANT
CUP SUT UNIV REVERS 39 NEU (Shoulder) IMPLANT
DERMABOND ADVANCED .7 DNX12 (GAUZE/BANDAGES/DRESSINGS) ×1 IMPLANT
DRAPE INCISE IOBAN 66X45 STRL (DRAPES) IMPLANT
DRAPE ORTHO SPLIT 77X108 STRL (DRAPES) ×2
DRAPE SHEET LG 3/4 BI-LAMINATE (DRAPES) ×1 IMPLANT
DRAPE SURG 17X11 SM STRL (DRAPES) ×1 IMPLANT
DRAPE SURG ORHT 6 SPLT 77X108 (DRAPES) ×2 IMPLANT
DRAPE TOP 10253 STERILE (DRAPES) ×1 IMPLANT
DRAPE U-SHAPE 47X51 STRL (DRAPES) ×1 IMPLANT
DRSG AQUACEL AG ADV 3.5X10 (GAUZE/BANDAGES/DRESSINGS) IMPLANT
DURAPREP 26ML APPLICATOR (WOUND CARE) ×1 IMPLANT
ELECT BLADE TIP CTD 4 INCH (ELECTRODE) ×1 IMPLANT
ELECT PENCIL ROCKER SW 15FT (MISCELLANEOUS) ×1 IMPLANT
ELECT REM PT RETURN 15FT ADLT (MISCELLANEOUS) ×1 IMPLANT
FACESHIELD WRAPAROUND (MASK) ×4 IMPLANT
FACESHIELD WRAPAROUND OR TEAM (MASK) ×5 IMPLANT
GLENOID UNI REV MOD 24 +2 LAT (Joint) IMPLANT
GLENOSPHERE 39+4 LAT/24 UNI RV (Joint) IMPLANT
GLOVE BIO SURGEON STRL SZ7.5 (GLOVE) ×1 IMPLANT
GLOVE BIO SURGEON STRL SZ8 (GLOVE) ×1 IMPLANT
GLOVE SS BIOGEL STRL SZ 7 (GLOVE) ×1 IMPLANT
GLOVE SS BIOGEL STRL SZ 7.5 (GLOVE) ×1 IMPLANT
GOWN STRL SURGICAL XL XLNG (GOWN DISPOSABLE) ×2 IMPLANT
INSERT HUMERAL M/39 +3/CNSTRND (Miscellaneous) IMPLANT
KIT BASIN OR (CUSTOM PROCEDURE TRAY) ×1 IMPLANT
KIT TURNOVER KIT A (KITS) IMPLANT
MANIFOLD NEPTUNE II (INSTRUMENTS) ×1 IMPLANT
NDL TAPERED W/ NITINOL LOOP (MISCELLANEOUS) ×1 IMPLANT
NEEDLE TAPERED W/ NITINOL LOOP (MISCELLANEOUS) ×1 IMPLANT
NS IRRIG 1000ML POUR BTL (IV SOLUTION) ×1 IMPLANT
PACK SHOULDER (CUSTOM PROCEDURE TRAY) ×1 IMPLANT
PAD ARMBOARD 7.5X6 YLW CONV (MISCELLANEOUS) ×1 IMPLANT
PAD COLD SHLDR WRAP-ON (PAD) ×1 IMPLANT
PIN SET MODULAR GLENOID SYSTEM (PIN) IMPLANT
RESTRAINT HEAD UNIVERSAL NS (MISCELLANEOUS) ×1 IMPLANT
SCREW CENTRAL MOD 30MM (Screw) IMPLANT
SCREW PERI LOCK 5.5X16 (Screw) IMPLANT
SCREW PERI LOCK 5.5X24 (Screw) IMPLANT
SCREW PERI LOCK 5.5X36 (Screw) IMPLANT
SCREW PERIPHERAL 5.5X20 LOCK (Screw) IMPLANT
SLING ARM FOAM STRAP LRG (SOFTGOODS) IMPLANT
SPONGE T-LAP 4X18 ~~LOC~~+RFID (SPONGE) ×1 IMPLANT
STEM HUMERAL MOD SZ 5 135 DEG (Stem) IMPLANT
SUCTION FRAZIER HANDLE 12FR (TUBING) ×1
SUCTION TUBE FRAZIER 12FR DISP (TUBING) ×1 IMPLANT
SUT FIBERWIRE #2 38 T-5 BLUE (SUTURE)
SUT MNCRL AB 3-0 PS2 18 (SUTURE) ×1 IMPLANT
SUT MON AB 2-0 CT1 36 (SUTURE) ×1 IMPLANT
SUT VIC AB 1 CT1 36 (SUTURE) ×1 IMPLANT
SUTURE FIBERWR #2 38 T-5 BLUE (SUTURE) IMPLANT
SUTURE TAPE 1.3 40 TPR END (SUTURE) ×2 IMPLANT
SUTURETAPE 1.3 40 TPR END (SUTURE) ×2
TOWEL OR 17X26 10 PK STRL BLUE (TOWEL DISPOSABLE) ×1 IMPLANT
TOWEL OR NON WOVEN STRL DISP B (DISPOSABLE) ×1 IMPLANT
TUBE SUCTION HIGH CAP CLEAR NV (SUCTIONS) ×1 IMPLANT
TUBING CONNECTING 10 (TUBING) IMPLANT
WATER STERILE IRR 1000ML POUR (IV SOLUTION) ×2 IMPLANT

## 2022-02-11 NOTE — Anesthesia Procedure Notes (Signed)
Anesthesia Regional Block: Interscalene brachial plexus block   Pre-Anesthetic Checklist: , timeout performed,  Correct Patient, Correct Site, Correct Laterality,  Correct Procedure, Correct Position, site marked,  Risks and benefits discussed,  Surgical consent,  Pre-op evaluation,  At surgeon's request and post-op pain management  Laterality: Right  Prep: chloraprep       Needles:  Injection technique: Single-shot  Needle Type: Stimiplex     Needle Length: 9cm  Needle Gauge: 21     Additional Needles:   Procedures:,,,, ultrasound used (permanent image in chart),,    Narrative:  Start time: 02/11/2022 7:18 AM End time: 02/11/2022 7:23 AM Injection made incrementally with aspirations every 5 mL.  Performed by: Personally  Anesthesiologist: Lynda Rainwater, MD

## 2022-02-11 NOTE — Discharge Instructions (Addendum)
 Kevin M. Supple, M.D., F.A.A.O.S. Orthopa[facility] Specializing in Arthroscopic and Reconstructive Surgery of the Shoulder [phone] [address] - [place], [place] [address] - Fax [phone]   POST-OP TOTAL SHOULDER REPLACEMENT INSTRUCTIONS  1. Follow up in the office for your first post-op appointment 10-14 days from the date of your surgery. If you do not already have a scheduled appointment, our office will contact you to schedule.  2. The bandage over your incision is waterproof. You may begin showering with this dressing on. You may leave this dressing on until first follow up appointment within 2 weeks. We prefer you leave this dressing in place until follow up however after 5-7 days if you are having itching or skin irritation and would like to remove it you may do so. Go slow and tug at the borders gently to break the bond the dressing has with the skin. At this point if there is no drainage it is okay to go without a bandage or you may cover it with a light guaze and tape. You can also expect significant bruising around your shoulder that will drift down your arm and into your chest wall. This is very normal and should resolve over several days.   3. Wear your sling/immobilizer at all times except to perform the exercises below or to occasionally let your arm dangle by your side to stretch your elbow. You also need to sleep in your sling immobilizer until instructed otherwise. It is ok to remove your sling if you are sitting in a controlled environment and allow your arm to rest in a position of comfort by your side or on your lap with pillows to give your neck and skin a break from the sling. You may remove it to allow arm to dangle by side to shower. If you are up walking around and when you go to sleep at night you need to wear it.  4. Range of motion to your elbow, wrist, and hand are encouraged 3-5 times daily. Exercise to your hand and fingers helps to reduce  swelling you may experience.   5. Prescriptions for a pain medication and a muscle relaxant are provided for you. It is recommended that if you are experiencing pain that you pain medication alone is not controlling, add the muscle relaxant along with the pain medication which can give additional pain relief. The first 1-2 days is generally the most severe of your pain and then should gradually decrease. As your pain lessens it is recommended that you decrease your use of the pain medications to an "as needed basis'" only and to always comply with the recommended dosages of the pain medications.  6. Pain medications can produce constipation along with their use. If you experience this, the use of an over the counter stool softener or laxative daily is recommended.   7. For additional questions or concerns, please do not hesitate to call the office. If after hours there is an answering service to forward your concerns to the physician on call.  8.Pain control following an exparel block  To help control your post-operative pain you received a nerve block  performed with Exparel which is a long acting anesthetic (numbing agent) which can provide pain relief and sensations of numbness (and relief of pain) in the operative shoulder and arm for up to 3 days. Sometimes it provides mixed relief, meaning you may still have numbness in certain areas of the arm but can still be able to   move  parts of that arm, hand, and fingers. We recommend that your prescribed pain medications  be used as needed. We do not feel it is necessary to "pre medicate" and "stay ahead" of pain.  Taking narcotic pain medications when you are not having any pain can lead to unnecessary and potentially dangerous side effects.    9. Use the ice machine as much as possible in the first 5-7 days from surgery, then you can wean its use to as needed. The ice typically needs to be replaced every 6 hours, instead of ice you can actually freeze  water bottles to put in the cooler and then fill water around them to avoid having to purchase ice. You can have spare water bottles freezing to allow you to rotate them once they have melted. Try to have a thin shirt or light cloth or towel under the ice wrap to protect your skin.   FOR ADDITIONAL INFO ON ICE MACHINE AND INSTRUCTIONS GO TO THE WEBSITE AT  [email or web address]  10.  We recommend that you avoid any dental work or cleaning in the first 3 months following your joint replacement. This is to help minimize the possibility of infection from the bacteria in your mouth that enters your bloodstream during dental work. We also recommend that you take an antibiotic prior to your dental work for the first year after your shoulder replacement to further help reduce that risk. Please simply contact our office for antibiotics to be sent to your pharmacy prior to dental work.  11. Dental Antibiotics:  We recommend waiting at least 3 months for any dental work even cleanings unless there is a IT consultant. We also recommend  prophylactic antibiotics for all dental procdeures  the first year following your joint replacement. In some exceptions we recommend them to be used lifelong. We will provide you with that prescription in follow up office visits, or you can call our office.  Exceptions are as follows:  1. History of prior total joint infection  2. Severely immunocompromised (Organ Transplant, cancer chemotherapy, Rheumatoid biologic meds such as [place])  3. Poorly controlled diabetes (A1C &gt; 8.0, blood glucose over 200)   POST-OP EXERCISES  Pendulum Exercises  Perform pendulum exercises while standing and bending at the waist. Support your uninvolved arm on a table or chair and allow your operated arm to hang freely. Make sure to do these exercises passively - not using you shoulder muscles. These exercises can be performed once your  nerve block effects have worn off.  Repeat 20 times. Do 3 sessions per day.     Patient to remain out of work until further notice

## 2022-02-11 NOTE — Op Note (Signed)
02/11/2022  9:51 AM  PATIENT:   Celene Skeen  66 y.o. female  PRE-OPERATIVE DIAGNOSIS:  Right shoulder rotator cuff tear arthropathy  POST-OPERATIVE DIAGNOSIS: Same  PROCEDURE: Right shoulder reverse arthroplasty utilizing press-fit size 5 Arthrex stem with a neutral metaphysis, +3 constrained polyethylene insert, 39/+4 glenosphere and a small/+2 baseplate  SURGEON:  Schawn Byas, Metta Clines M.D.  ASSISTANTS: Jenetta Loges, PA-C  Jenetta Loges, PA-C was utilized as an Environmental consultant throughout this case, essential for help with positioning the patient, positioning extremity, tissue manipulation, implantation of the prosthesis, suture management, wound closure, and intraoperative decision-making.  ANESTHESIA:   General endotracheal and interscalene block with Exparel  EBL: 200 cc  SPECIMEN: None  Drains: None   PATIENT DISPOSITION:  PACU - hemodynamically stable.    PLAN OF CARE: Discharge to home after PACU  Brief history:  Patient is a 66 year old female with chronic and progressively increasing right shoulder pain related to severe rotator cuff tear arthropathy.  She does have a remote history of mini open rotator cuff repair.  Due to her increasing functional limitations and failure to respond to prolonged attempts at conservative management, she is brought to the operating room at this time for planned right shoulder reverse arthroplasty  Preoperatively, I counseled the patient regarding treatment options and risks versus benefits thereof.  Possible surgical complications were all reviewed including potential for bleeding, infection, neurovascular injury, persistent pain, loss of motion, anesthetic complication, failure of the implant, and possible need for additional surgery. They understand and accept and agrees with our planned procedure.   Procedure detail:  After undergoing routine preop evaluation the patient received prophylactic antibiotics and interscalene block with  Exparel was established in the holding area by the anesthesia department.  Patient was subsequently placed spine on the operating table and underwent the smooth induction of a general endotracheal anesthesia.  Placed into the beachchair position and appropriately padded and protected.  The right shoulder girdle region was sterilely prepped and draped in standard fashion.  Timeout was called.  A deltopectoral approach to the right shoulder was made through an approximately 12 cm incision.  Skin flaps were elevated dissection carried deep in the deltopectoral interval was developed from proximal to distal with the vein taken laterally.  Dissection carried deeply through the very thick subcutaneous fatty layer.  The deltopectoral interval was then developed from proximal to distal with the vein taken laterally.  Multiple adhesions were divided beneath the deltoid and the conjoined tendon was mobilized and retracted medially.  There had been prior biceps tenotomy versus tenodesis and so he simply released the subscapularis from the lesser tuberosity and tagged margin with suture tape sutures.  Superior remnant of the subscapularis was split to the base of the coracoid and then capsular attachments were then divided from the anterior and inferior margins of the humeral neck allowing deliver the humeral head through the wound.  An extra medullary guide was then used to outline the proposed humeral head resection which we performed with an oscillating saw at approximately 20 degrees of retroversion.  Several retained suture anchors were removed from the humeral metaphysis and a metal cap was then placed over the cut proximal humeral surface.  We then exposed the glenoid with appropriate retractors and performed a circumferential labral resection.  A guidepin was then directed into the center of the glenoid and the glenoid was then prepared with the central followed by the peripheral reamer to a stable subchondral bony bed.   Preparation completed with  the central drill and a 30 mm tap.  Our baseplate was assembled and was then inserted with vancomycin powder applied to the threads of the lag screw and excellent purchase was achieved.  The peripheral locking screws were all then placed using standard technique with excellent fixation.  A 39/+4 glenosphere was then impacted onto the baseplate and central locking screw was placed.  We then exposed the metaphysis and open the canal and reaming up to size 6 and ultimately broaching up to a size 5 with very sclerotic bone noted and solid fixation achieved.  A neutral metaphyseal reaming guide was then utilized to prepare the metaphysis and some residual suture material and anchors were removed in their entirety.  At this point a trial implant was placed and initial reduction showed good motion good stability and good soft tissue balance.  Trial was then removed.  The final implant was assembled.  The canal was irrigated cleaned and dried.  Vancomycin powder applied into the canal and the implant was then seated with excellent purchase and fixation.  Trial reduction was then performed and ultimately felt that a +3 poly constrained was the best fit.  The trial was removed the final implant poly was then impacted final reduction was performed again showing excellent motion stability and soft tissue balance all much to our satisfaction.  Confirmed good elasticity of the subscapularis and it was then repaired back to the eyelets on the collar of the implant.  Final irrigation was completed.  Hemostasis was obtained.  The balance of the vancomycin powder was then spread liberally throughout the deep soft tissue planes.  We did identify defect in cephalic vein necessitating ligation above and below the defect and ultimate hemostasis was obtained.  The deltopectoral interval was then reapproximated with a series of figure-of-eight number Vicryl sutures.  2-0 Monocryl used to close the subcu layer  and intracuticular 3-0 Monocryl used to close the skin followed by Dermabond and Aquacel dressing.  The right arm was placed into a sling and the patient was awakened, extubated, and taken to the recovery room in stable condition.  Marin Shutter MD   Contact # [phone]

## 2022-02-11 NOTE — Evaluation (Signed)
Occupational Therapy Evaluation Patient Details Name: Alexandria Ruiz MRN: [id number] DOB: 03-24-1955 Today's Date: 02/11/2022   History of Present Illness Alexandria Ruiz is a 66 yr old female who is s/p a R shoulder reverse arthroplasty on 02-11-2022, secondary to R shoulder rotator cuff tear arthopathy.   Clinical Impression   Patient is s/p shoulder replacement without functional use of right dominant upper extremity secondary to effects of surgery and interscalene block and shoulder precautions. Patient caregiver was unavailable, therefore OT provided education to the patient. Therapist provided education and instruction to patient with regards to ROM, exercises, post-op precautions, UE positioning, donning upper extremity clothing and recommendations for bathing, while maintaining shoulder precautions, use ice for edema and pain management and how to correctly donn/doff sling. Patient verbalized understanding and demonstrated understanding as needed. Patient needed assistance to donn shirt, underwear, pants, and shoes with instruction provided on compensatory strategies to perform ADLs. Patient to follow up with MD for further therapy needs.        Recommendations for follow up therapy are one component of a multi-disciplinary discharge planning process, led by the attending physician.  Recommendations may be updated based on patient status, additional functional criteria and insurance authorization.   Follow Up Recommendations  Follow physician's recommendations for discharge plan and follow up therapies     Assistance Recommended at Discharge Intermittent Supervision/Assistance  Patient can return home with the following Assist for transportation;Assistance with cooking/housework;Help with stairs or ramp for entrance;A little help with bathing/dressing/bathroom    Functional Status Assessment  Patient has had a recent decline in their functional status and demonstrates the ability  to make significant improvements in function in a reasonable and predictable amount of time.  Equipment Recommendations  None recommended by OT       Precautions / Restrictions Precautions Precautions: Shoulder Shoulder Interventions: Shoulder sling/immobilizer Precaution Booklet Issued: Yes (comment) Precaution Comments: If sitting in controlled environment, ok to come out of sling to give neck a break. Please sleep in it to protect until follow up in office.     OK to use operative arm for feeding, hygiene and ADLs. Ok to instruct Pendulums and lap slides as exercises. Ok to use operative arm within the following parameters for ADL purposes     New ROM (8/18)   Ok for PROM, AAROM, AROM within pain tolerance and within the following ROM   ER 20   ABD 45   FE 60 Required Braces or Orthoses: Sling Restrictions Weight Bearing Restrictions: Yes RUE Weight Bearing: Non weight bearing          ADL either performed or assessed with clinical judgement      Vision Patient Visual Report: No change from baseline           Pertinent Vitals/Pain Pain Assessment Pain Assessment: No/denies pain     Hand Dominance Right      Communication Communication Communication: No difficulties   Cognition Arousal/Alertness: Awake/alert Behavior During Therapy: WFL for tasks assessed/performed Overall Cognitive Status: Within Functional Limits for tasks assessed            General Comments: Oriented x4, able to follow commands without difficulty           Shoulder Instructions Shoulder Instructions Donning/doffing shirt without moving shoulder: Minimal assistance (with pt) Method for sponge bathing under operated UE:  (Patient able to independently verbalize) Donning/doffing sling/immobilizer: Minimal assistance (with pt) Correct positioning of sling/immobilizer:  (Patient able to independently verbalize) Pendulum exercises (written home exercise  program):  (Patient verbalized  understanding of how to perform) ROM for elbow, wrist and digits of operated UE:  (Patient independently verbalized how to perform) Sling wearing schedule (on at all times/off for ADL's):  (Patient verbalized understanding) Proper positioning of operated UE when showering:  (Patient independently verbalized) Positioning of UE while sleeping:  (Patient verbalized understanding)    [place] expects to be discharged to:: Private residence Living Arrangements: Spouse/significant other Available Help at Discharge: Family;Available PRN/intermittently Type of Home: House Home Access: Stairs to enter Entrance Stairs-Number of Steps: 1   Home Layout: One level     Bathroom Shower/Tub: Tub/shower unit         Home Equipment: Conservation officer, nature (2 wheels);Rollator (4 wheels);Shower seat;Cane - single point          Prior Functioning/Environment Prior Level of Function : Independent/Modified Independent             Mobility Comments: Patient was independent with ambulation. ADLs Comments: Patient was independent with ADLs, driving, cooking, and cleaning.        OT Problem List: Impaired UE functional use;Decreased range of motion      OT Treatment/Interventions:   No further OT treatment needs identified       OT Frequency:  N/A       AM-PAC OT "6 Clicks" Daily Activity     Outcome Measure Help from another person eating meals?: None Help from another person taking care of personal grooming?: None Help from another person toileting, which includes using toliet, bedpan, or urinal?: None Help from another person bathing (including washing, rinsing, drying)?: A Little Help from another person to put on and taking off regular upper body clothing?: A Little Help from another person to put on and taking off regular lower body clothing?: A Little 6 Click Score: 21   End of Session Nurse Communication: Other (comment) (Nurse informed of shoulder education  completion)  Activity Tolerance: Patient tolerated treatment well Patient left: in chair;with call bell/phone within reach;with nursing/sitter in room  OT Visit Diagnosis: Muscle weakness (generalized) (M62.81)                Time: 3254-9826 OT Time Calculation (min): 28 min Charges:  OT General Charges $OT Visit: 1 Visit OT Evaluation $OT Eval Low Complexity: 1 Low OT Treatments $Self Care/Home Management : 8-22 mins    Leota Sauers, OTR/L 02/11/2022, 1:42 PM

## 2022-02-11 NOTE — Anesthesia Procedure Notes (Signed)
Procedure Name: Intubation Date/Time: 02/11/2022 7:43 AM  Performed by: Eben Burow, CRNAPre-anesthesia Checklist: Patient identified, Emergency Drugs available, Suction available, Patient being monitored and Timeout performed Patient Re-evaluated:Patient Re-evaluated prior to induction Oxygen Delivery Method: Circle system utilized Preoxygenation: Pre-oxygenation with 100% oxygen Induction Type: IV induction Ventilation: Mask ventilation without difficulty Laryngoscope Size: Mac and 4 Tube type: Oral Tube size: 7.0 mm Number of attempts: 1 Airway Equipment and Method: Stylet Placement Confirmation: ETT inserted through vocal cords under direct vision, positive ETCO2 and breath sounds checked- equal and bilateral Secured at: 22 cm Tube secured with: Tape Dental Injury: Teeth and Oropharynx as per pre-operative assessment

## 2022-02-11 NOTE — Transfer of Care (Signed)
Immediate Anesthesia Transfer of Care Note  Patient: Alexandria Ruiz  Procedure(s) Performed: REVERSE SHOULDER ARTHROPLASTY (Right: Shoulder)  Patient Location: PACU  Anesthesia Type:GA combined with regional for post-op pain  Level of Consciousness: awake, alert , and patient cooperative  Airway & Oxygen Therapy: Patient Spontanous Breathing and Patient connected to face mask oxygen  Post-op Assessment: Report given to RN and Post -op Vital signs reviewed and stable  Post vital signs: Reviewed and stable  Last Vitals:  Vitals Value Taken Time  BP 103/75   Temp    Pulse 75   Resp 20   SpO2 100     Last Pain:  Vitals:   02/11/22 0554  TempSrc: Oral         Complications: No notable events documented.

## 2022-02-11 NOTE — Anesthesia Postprocedure Evaluation (Signed)
Anesthesia Post Note  Patient: SHAWNELL DYKES  Procedure(s) Performed: REVERSE SHOULDER ARTHROPLASTY (Right: Shoulder)     Patient location during evaluation: PACU Anesthesia Type: General Level of consciousness: awake and alert Pain management: pain level controlled Vital Signs Assessment: post-procedure vital signs reviewed and stable Respiratory status: spontaneous breathing, nonlabored ventilation and respiratory function stable Cardiovascular status: blood pressure returned to baseline and stable Postop Assessment: no apparent nausea or vomiting Anesthetic complications: no   No notable events documented.  Last Vitals:  Vitals:   02/11/22 1115 02/11/22 1130  BP: (!) 84/74 106/66  Pulse: 71 70  Resp: 14 20  Temp:    SpO2: 93% 91%    Last Pain:  Vitals:   02/11/22 1115  TempSrc:   PainSc: 0-No pain                 Lynda Rainwater

## 2022-02-11 NOTE — H&P (Signed)
Alexandria Ruiz    Chief Complaint: Right shoulder rotator cuff tear arthropathy HPI: The patient is a 66 y.o. female with chronic and progressively increasing right shoulder pain related to severe rotator cuff tear arthropathy.  Due to her increasing functional limitations and failure to respond to prolonged attempts at conservative management, she is brought to the operating room at this time for planned right shoulder reverse arthroplasty  Past Medical History:  Diagnosis Date   Abdominal pain    Allergic rhinitis    Anemia    Anxiety    Arthritis    Asthma    Bronchitis    Depression    Diabetes mellitus    type 2   GERD (gastroesophageal reflux disease)    Hyperlipidemia    Hypertension    Macular degeneration    R eye   Obesity    Peripheral neuropathy    denies has carpal tunnel   PONV (postoperative nausea and vomiting)    Shortness of breath dyspnea    since starting Trulicity diabetic med   Sleep apnea    uses c-pap machine      Past Surgical History:  Procedure Laterality Date   ABDOMINAL HYSTERECTOMY     BREAST REDUCTION SURGERY     HAMMER TOE SURGERY     bilateral feet   JOINT REPLACEMENT     R TKA Dr. Wynelle Link 07-25-17   NOSE SURGERY     sinus surgery   ORIF ANKLE FRACTURE  06/29/2011   Procedure: OPEN REDUCTION INTERNAL FIXATION (ORIF) ANKLE FRACTURE;  Surgeon: Sharmon Revere, MD;  Location: [facility];  Service: Orthopedics;  Laterality: Right;   right rotator cuff surgery - 11-2013     RIGHT/LEFT HEART CATH AND CORONARY ANGIOGRAPHY N/A 09/27/2019   Procedure: RIGHT/LEFT HEART CATH AND CORONARY ANGIOGRAPHY;  Surgeon: Martinique, Peter M, MD;  Location: [facility];  Service: Cardiovascular;  Laterality: N/A;   STERIOD INJECTION Right 06/23/2015   Procedure: RIGHT KNEE CORTISONE INJECTION;  Surgeon: Gaynelle Arabian, MD;  Location: [facility];  Service: Orthopedics;  Laterality: Right;   TOTAL KNEE ARTHROPLASTY Left 06/23/2015   Procedure: TOTAL LEFT KNEE  ARTHROPLASTY;  Surgeon: Gaynelle Arabian, MD;  Location: [facility];  Service: Orthopedics;  Laterality: Left;   TOTAL KNEE ARTHROPLASTY Right 07/25/2017   Procedure: RIGHT TOTAL KNEE ARTHROPLASTY;  Surgeon: Gaynelle Arabian, MD;  Location: [facility];  Service: Orthopedics;  Laterality: Right;  Adductor Block    Family History  Adopted: Yes    Social History:  reports that she quit smoking about 15 years ago. Her smoking use included cigarettes. She has a 15.00 pack-year smoking history. She has never used smokeless tobacco. She reports that she does not drink alcohol and does not use drugs.  BMI: Estimated body mass index is 43.52 kg/m as calculated from the following:   Height as of this encounter: '5\' 11"'$  (1.803 m).   Weight as of this encounter: 141.5 kg.  Lab Results  Component Value Date   ALBUMIN 3.4 (L) 05/23/2019   Diabetes:   Patient has a diagnosis of diabetes,  Lab Results  Component Value Date   HGBA1C 5.9 (H) 09/27/2019   Smoking Status:       Medications Prior to Admission  Medication Sig Dispense Refill   ALPRAZolam (XANAX) 0.5 MG tablet Take 0.5 mg by mouth See admin instructions. Take one tablet by mouth every day except Saturday and Sundays     atorvastatin (LIPITOR) 20 MG tablet Take  20 mg by mouth 3 (three) times daily.     budesonide-formoterol (SYMBICORT) 160-4.5 MCG/ACT inhaler Inhale 2 puffs into the lungs 2 (two) times daily.     carvedilol (COREG) 6.25 MG tablet TAKE 1 TABLET BY MOUTH 2 TIMES DAILY WITH A MEAL. (Patient taking differently: Take 6.25 mg by mouth 2 (two) times daily with a meal.) 180 tablet 0   Dulaglutide (TRULICITY) 1.5 HW/8.0SU SOPN Inject 0.5 mg into the skin once a week.     DULoxetine (CYMBALTA) 60 MG capsule Take 60 mg by mouth 2 (two) times daily.     estradiol (ESTRACE) 1 MG tablet Take 1 mg by mouth at bedtime.     Eszopiclone 3 MG TABS Take 3 mg by mouth at bedtime. Take immediately before bedtime     furosemide (LASIX) 40 MG tablet  Take 1 tablet (40 mg total) by mouth daily. (Patient taking differently: Take 60 mg by mouth daily.) 90 tablet 3   hydrOXYzine (ATARAX) 25 MG tablet Take 25 mg by mouth 2 (two) times daily as needed for itching.     levocetirizine (XYZAL) 5 MG tablet Take 5 mg by mouth every evening.     potassium chloride SA (KLOR-CON) 20 MEQ tablet Take 1.5 tablets (30 mEq total) by mouth daily. 135 tablet 3   sacubitril-valsartan (ENTRESTO) 49-51 MG Take 1 tablet by mouth 2 (two) times daily. 180 tablet 3   spironolactone (ALDACTONE) 25 MG tablet TAKE 1 TABLET (25 MG TOTAL) BY MOUTH DAILY. 90 tablet 0   valACYclovir (VALTREX) 500 MG tablet Take 500 mg by mouth daily as needed (for outbreaks).     Semaglutide, 2 MG/DOSE, (OZEMPIC, 2 MG/DOSE,) 8 MG/3ML SOPN Inject 2 mg into the skin once a week. 6 mL 6     Physical Exam: Right shoulder demonstrates painful and guarded motion is noted at recent office visits.  She has globally decreased strength to manual muscle testing.  Remains neurovascular intact in the right upper extremity.  Examination otherwise as noted at recent office visits.  Imaging studies confirmed changes consistent with chronic rotator cuff tear arthropathy.  Vitals  Temp:  [98.3 F (36.8 C)-98.4 F (36.9 C)] 98.3 F (36.8 C) (11/30 0554) Pulse Rate:  [82-93] 82 (11/30 0554) Resp:  [16-18] 18 (11/30 0554) BP: (118-120)/(64-78) 118/64 (11/30 0554) SpO2:  [97 %-98 %] 97 % (11/30 0554) Weight:  [141.5 kg] 141.5 kg (11/30 0622)  Assessment/Plan  Impression: Right shoulder rotator cuff tear arthropathy  Plan of Action: Procedure(s): REVERSE SHOULDER ARTHROPLASTY  Alexandria Ruiz M Alexandria Ruiz 02/11/2022, 6:28 AM Contact # [phone]

## 2022-02-12 ENCOUNTER — Encounter (HOSPITAL_COMMUNITY): Payer: Self-pay | Admitting: Orthopedic Surgery

## 2022-03-03 ENCOUNTER — Other Ambulatory Visit: Payer: Self-pay | Admitting: Obstetrics & Gynecology

## 2022-03-03 DIAGNOSIS — R928 Other abnormal and inconclusive findings on diagnostic imaging of breast: Secondary | ICD-10-CM

## 2022-03-09 ENCOUNTER — Other Ambulatory Visit (HOSPITAL_COMMUNITY): Payer: Self-pay | Admitting: Cardiology

## 2022-03-09 MED ORDER — CARVEDILOL 6.25 MG PO TABS: 6.2500 mg | ORAL_TABLET | Freq: Two times a day (BID) | ORAL | 3 refills | Status: DC

## 2022-03-09 MED ORDER — SPIRONOLACTONE 25 MG PO TABS: 25.0000 mg | ORAL_TABLET | Freq: Every day | ORAL | 0 refills | Status: DC

## 2022-03-31 ENCOUNTER — Ambulatory Visit
Admission: RE | Admit: 2022-03-31 | Discharge: 2022-03-31 | Disposition: A | Discharge status: Home / Self Care | Payer: BLUE CROSS/BLUE SHIELD | Source: Ambulatory Visit | Attending: Obstetrics & Gynecology | Admitting: Obstetrics & Gynecology

## 2022-03-31 ENCOUNTER — Other Ambulatory Visit: Payer: Self-pay | Admitting: Obstetrics & Gynecology

## 2022-03-31 ENCOUNTER — Ambulatory Visit
Admission: RE | Admit: 2022-03-31 | Discharge: 2022-03-31 | Disposition: A | Discharge status: Home / Self Care | Payer: 59 | Source: Ambulatory Visit | Attending: Obstetrics & Gynecology | Admitting: Obstetrics & Gynecology

## 2022-03-31 DIAGNOSIS — R928 Other abnormal and inconclusive findings on diagnostic imaging of breast: Secondary | ICD-10-CM

## 2022-03-31 DIAGNOSIS — N6310 Unspecified lump in the right breast, unspecified quadrant: Secondary | ICD-10-CM

## 2022-04-15 ENCOUNTER — Other Ambulatory Visit (HOSPITAL_COMMUNITY): Payer: Self-pay | Admitting: Cardiology

## 2022-04-27 ENCOUNTER — Ambulatory Visit: Payer: BLUE CROSS/BLUE SHIELD

## 2022-05-05 ENCOUNTER — Ambulatory Visit
Admission: RE | Admit: 2022-05-05 | Discharge: 2022-05-05 | Disposition: A | Discharge status: Home / Self Care | Payer: 59 | Source: Ambulatory Visit | Attending: Obstetrics & Gynecology | Admitting: Obstetrics & Gynecology

## 2022-05-05 DIAGNOSIS — N6310 Unspecified lump in the right breast, unspecified quadrant: Secondary | ICD-10-CM

## 2022-05-05 DIAGNOSIS — R928 Other abnormal and inconclusive findings on diagnostic imaging of breast: Secondary | ICD-10-CM

## 2022-05-05 HISTORY — PX: BREAST BIOPSY: SHX20

## 2022-05-27 ENCOUNTER — Other Ambulatory Visit: Payer: Self-pay | Admitting: Internal Medicine

## 2022-05-27 DIAGNOSIS — N1832 Chronic kidney disease, stage 3b: Secondary | ICD-10-CM

## 2022-06-04 ENCOUNTER — Other Ambulatory Visit: Payer: Self-pay | Admitting: Cardiology

## 2022-06-14 ENCOUNTER — Other Ambulatory Visit (HOSPITAL_COMMUNITY): Payer: Self-pay

## 2022-06-14 ENCOUNTER — Other Ambulatory Visit: Payer: Self-pay | Admitting: Cardiology

## 2022-06-14 ENCOUNTER — Other Ambulatory Visit (HOSPITAL_COMMUNITY): Payer: Self-pay | Admitting: Cardiology

## 2022-06-14 ENCOUNTER — Telehealth (HOSPITAL_COMMUNITY): Payer: Self-pay | Admitting: Cardiology

## 2022-06-14 DIAGNOSIS — I5042 Chronic combined systolic (congestive) and diastolic (congestive) heart failure: Secondary | ICD-10-CM

## 2022-06-14 MED ORDER — OZEMPIC (0.25 OR 0.5 MG/DOSE) 2 MG/3ML ~~LOC~~ SOPN: PEN_INJECTOR | SUBCUTANEOUS | 1 refills | Status: DC

## 2022-06-14 MED ORDER — SPIRONOLACTONE 25 MG PO TABS: 25.0000 mg | ORAL_TABLET | Freq: Every day | ORAL | 0 refills | Status: DC

## 2022-06-14 NOTE — Telephone Encounter (Signed)
Message requesting refills on ozempiz and spiro Will refill spiro and send ozempic to correct pharmacy team

## 2022-06-14 NOTE — Telephone Encounter (Signed)
Ozempic was removed from pt's med list [id number] and she has Trulicity on it now.  Called pt to clarify. States she couldn't get Ozempic, PCP rx Trulicity, doesn't want to take but picked it up yesterday? Wants to go back to Ozempic. Rx sent in. Unclear history, sounds like she has been off GLP for a few months. Will start back at 0.25mg  dose x 4 weeks then increase to 0.5mg .

## 2022-06-17 ENCOUNTER — Other Ambulatory Visit (HOSPITAL_COMMUNITY): Payer: Self-pay

## 2022-06-17 NOTE — Telephone Encounter (Signed)
  Per test claim [employer] coverage ended 06/13/22. Is patient aware of this or have they provided new insurance information to the clinic?

## 2022-06-21 ENCOUNTER — Ambulatory Visit
Admission: RE | Admit: 2022-06-21 | Discharge: 2022-06-21 | Disposition: A | Discharge status: Home / Self Care | Payer: 59 | Source: Ambulatory Visit | Attending: Internal Medicine | Admitting: Internal Medicine

## 2022-06-21 DIAGNOSIS — N1832 Chronic kidney disease, stage 3b: Secondary | ICD-10-CM

## 2022-06-21 NOTE — Telephone Encounter (Signed)
Called pt and left message to see if she has new insurance.

## 2022-06-24 NOTE — Telephone Encounter (Signed)
Left another message for pt. Will await her return call at this time.

## 2022-08-02 ENCOUNTER — Ambulatory Visit (HOSPITAL_COMMUNITY)
Admission: RE | Admit: 2022-08-02 | Discharge: 2022-08-02 | Disposition: A | Discharge status: Home / Self Care | Payer: 59 | Source: Ambulatory Visit | Attending: Cardiology | Admitting: Cardiology

## 2022-08-02 ENCOUNTER — Encounter (HOSPITAL_COMMUNITY): Payer: Self-pay | Admitting: Cardiology

## 2022-08-02 ENCOUNTER — Ambulatory Visit (HOSPITAL_BASED_OUTPATIENT_CLINIC_OR_DEPARTMENT_OTHER)
Admission: RE | Admit: 2022-08-02 | Discharge: 2022-08-02 | Disposition: A | Discharge status: Home / Self Care | Payer: 59 | Source: Ambulatory Visit | Attending: Cardiology | Admitting: Cardiology

## 2022-08-02 VITALS — BP 100/68 | HR 88 | Wt 297.4 lb

## 2022-08-02 DIAGNOSIS — E785 Hyperlipidemia, unspecified: Secondary | ICD-10-CM | POA: Insufficient documentation

## 2022-08-02 DIAGNOSIS — Z87891 Personal history of nicotine dependence: Secondary | ICD-10-CM | POA: Insufficient documentation

## 2022-08-02 DIAGNOSIS — G4733 Obstructive sleep apnea (adult) (pediatric): Secondary | ICD-10-CM | POA: Insufficient documentation

## 2022-08-02 DIAGNOSIS — E119 Type 2 diabetes mellitus without complications: Secondary | ICD-10-CM | POA: Diagnosis not present

## 2022-08-02 DIAGNOSIS — Z6841 Body Mass Index (BMI) 40.0 and over, adult: Secondary | ICD-10-CM | POA: Diagnosis not present

## 2022-08-02 DIAGNOSIS — J45909 Unspecified asthma, uncomplicated: Secondary | ICD-10-CM | POA: Insufficient documentation

## 2022-08-02 DIAGNOSIS — I491 Atrial premature depolarization: Secondary | ICD-10-CM | POA: Insufficient documentation

## 2022-08-02 DIAGNOSIS — I428 Other cardiomyopathies: Secondary | ICD-10-CM | POA: Diagnosis not present

## 2022-08-02 DIAGNOSIS — Z79899 Other long term (current) drug therapy: Secondary | ICD-10-CM | POA: Diagnosis not present

## 2022-08-02 DIAGNOSIS — E669 Obesity, unspecified: Secondary | ICD-10-CM | POA: Insufficient documentation

## 2022-08-02 DIAGNOSIS — I5022 Chronic systolic (congestive) heart failure: Secondary | ICD-10-CM | POA: Diagnosis present

## 2022-08-02 DIAGNOSIS — I5042 Chronic combined systolic (congestive) and diastolic (congestive) heart failure: Secondary | ICD-10-CM

## 2022-08-02 DIAGNOSIS — I110 Hypertensive heart disease with heart failure: Secondary | ICD-10-CM | POA: Insufficient documentation

## 2022-08-02 LAB — BRAIN NATRIURETIC PEPTIDE: B Natriuretic Peptide: 11.1 pg/mL (ref 0.0–100.0)

## 2022-08-02 LAB — BASIC METABOLIC PANEL
Anion gap: 11 (ref 5–15)
BUN: 18 mg/dL (ref 8–23)
CO2: 24 mmol/L (ref 22–32)
Calcium: 9.8 mg/dL (ref 8.9–10.3)
Chloride: 98 mmol/L (ref 98–111)
Creatinine, Ser: 1.17 mg/dL — ABNORMAL HIGH (ref 0.44–1.00)
GFR, Estimated: 52 mL/min — ABNORMAL LOW (ref >60)
Glucose, Bld: 100 mg/dL — ABNORMAL HIGH (ref 70–99)
Potassium: 4.4 mmol/L (ref 3.5–5.1)
Sodium: 133 mmol/L — ABNORMAL LOW (ref 135–145)

## 2022-08-02 LAB — LIPID PANEL
Cholesterol: 208 mg/dL — ABNORMAL HIGH (ref 0–200)
HDL: 71 mg/dL (ref >40)
LDL Cholesterol: 101 mg/dL — ABNORMAL HIGH (ref 0–99)
Total CHOL/HDL Ratio: 2.9 RATIO
Triglycerides: 181 mg/dL — ABNORMAL HIGH (ref <150)
VLDL: 36 mg/dL (ref 0–40)

## 2022-08-02 LAB — ECHOCARDIOGRAM COMPLETE
AR max vel: 1.61 cm2
AV Area VTI: 1.72 cm2
AV Area mean vel: 1.61 cm2
AV Mean grad: 2.7 mmHg
AV Peak grad: 5 mmHg
Ao pk vel: 1.12 m/s
Area-P 1/2: 4.68 cm2
Calc EF: 56.9 %
MV VTI: 2.2 cm2
S' Lateral: 3.5 cm
Single Plane A2C EF: 55.7 %
Single Plane A4C EF: 59.3 %

## 2022-08-02 NOTE — Patient Instructions (Signed)
No changes to your medications  Labs done today, your results will be available in MyChart, we will contact you for abnormal readings.  You have been referred the [facility]. They will call you to arrange your appointment.  Your physician recommends that you schedule a follow-up appointment in: 6 months ( November ) ** please call the office in August to arrange your follow up appointment. **  If you have any questions or concerns before your next appointment please send [place] a message through [place] or call our office at [phone].    TO LEAVE A MESSAGE FOR THE NURSE SELECT OPTION 2, PLEASE LEAVE A MESSAGE INCLUDING: YOUR NAME DATE OF BIRTH CALL BACK NUMBER REASON FOR CALL**this is important as we prioritize the call backs  YOU WILL RECEIVE A CALL BACK THE SAME DAY AS LONG AS YOU CALL BEFORE 4:00 PM  At the [facility], you and your health needs are our priority. As part of our continuing mission to provide you with exceptional heart care, we have created designated Provider Care Teams. These Care Teams include your primary Cardiologist (physician) and Advanced Practice Providers (APPs- Physician Assistants and Nurse Practitioners) who all work together to provide you with the care you need, when you need it.   You may see any of the following providers on your designated Care Team at your next follow up: Dr Arvilla Meres Dr Marca Ancona Dr. Marcos Eke, NP Robbie Lis, [place] [facility] [place], [place] Brynda Peon, NP Karle Plumber, PharmD   Please be sure to bring in all your medications bottles to every appointment.    Thank you for choosing [facility]

## 2022-08-03 NOTE — Progress Notes (Signed)
[facility] Note   PCP: Elias Else, MD (Inactive) PCP-Cardiologist: Parke Poisson, MD  HF Cardiology: Dr. Shirlee Latch   HPI: Alexandria Ruiz is a 67 y.o. with history of asthma, HTN, HLD, DMII, OA,  R TKR & LTKR, OSA and recently diagnosed systolic heart failure.   Evaluated by Dr Jacques Navy for dyspnea in 2021. Had myoview and ECHO that showed reduced EF, 35-40%. She was set up for [facility] on 09/27/19 that showed normal cors, markedly elevated filling pressures, and low cardiac output, CI 1.3. Suspect flash pulmonary edema.  She was hypertensive in the cath lab so nitroglycerin drip was started. She was admitted, placed on milrinone and diuresed w/ IV Lasix. Cause of cardiomyopathy felt to be hypertension. She was diuresed and started on GDMT. Milrinone weaned off w/ stable Co-ox (73%).  Echo in 10/21 showed EF up to 50%, mild LVH, mildly decreased RV systolic function, mild MR.  She had to stop Jardiance due to recurrent yeast infections.   Echo was done today and reviewed, EF 55-60%, mild LVH, normal RV.    Patient returns for followup of CHF.  Weight is down 23 lbs.  She is using semaglutide. No dyspnea but not very active. No chest pain. No orthopnea/PND.  Left hip pain is main limitor at this time.   ECG (personally reviewed): NSR, PVC, nonspecific T wave inversions  Labs (7/21): K 4, creatinine 1.09 Labs (9/21): K 4.2, creatinine 1.09 Labs (10/21): K 4, creatinine 1.17 Labs (9/22): K 4.2, creatinine 1.08 Labs (10/22): K 4.6, creatinine 1.04 Labs (12/22): K 4.1, creatinine 1.00 Labs (6/23): K 5.2, creatinine 1.46  Review of Systems: All systems reviewed and negative except as per HPI.   PMH: 1. Type 2 diabetes.  2. GERD 3. HTN 4. Hyperlipidemia 5. OSA: CPAP.  6. OA with bilateral TKRs.  7. Asthma 8. Chronic systolic CHF: Nonischemic cardiomyopathy.  - Echo (6/21): EF 35-40%, global HK, moderate LV dilation, RV normal,  - LHC/RHC (7/21): No coronary disease.   Mean RA 35, PA 70/44, mean PCWP 46, CI 1.33.  - Echo (10/21): EF up to 50%, mild LVH, mildly decreased RV systolic function, mild MR. - Echo (5/24): EF 55-60%, mild LVH, normal RV.  9. Chronic LBP.  Current Outpatient Medications  Medication Sig Dispense Refill   ALPRAZolam (XANAX) 0.5 MG tablet Take 0.5 mg by mouth See admin instructions. Take one tablet by mouth every day except Saturday and Sundays     atorvastatin (LIPITOR) 20 MG tablet Take 20 mg by mouth 3 (three) times daily.     budesonide-formoterol (SYMBICORT) 160-4.5 MCG/ACT inhaler Inhale 2 puffs into the lungs 2 (two) times daily.     carvedilol (COREG) 6.25 MG tablet Take 1 tablet (6.25 mg total) by mouth 2 (two) times daily with a meal. 180 tablet 3   cyclobenzaprine (FLEXERIL) 10 MG tablet Take 1 tablet (10 mg total) by mouth 3 (three) times daily as needed for muscle spasms. 30 tablet 1   DULoxetine (CYMBALTA) 60 MG capsule Take 60 mg by mouth 2 (two) times daily.     ENTRESTO 49-51 MG TAKE 1 TABLET BY MOUTH TWICE A DAY 180 tablet 3   estradiol (ESTRACE) 1 MG tablet Take 1 mg by mouth at bedtime.     Eszopiclone 3 MG TABS Take 3 mg by mouth at bedtime. Take immediately before bedtime     furosemide (LASIX) 40 MG tablet Take 1 tablet (40 mg total) by mouth daily. 90 tablet 3  hydrOXYzine (ATARAX) 25 MG tablet Take 25 mg by mouth 2 (two) times daily as needed for itching.     levocetirizine (XYZAL) 5 MG tablet Take 5 mg by mouth every evening.     ondansetron (ZOFRAN) 4 MG tablet Take 1 tablet (4 mg total) by mouth every 8 (eight) hours as needed for nausea or vomiting. 10 tablet 0   potassium chloride SA (KLOR-CON M) 20 MEQ tablet Take 20 mEq by mouth daily.     spironolactone (ALDACTONE) 25 MG tablet Take 1 tablet (25 mg total) by mouth daily. Please call to schedule follow up appt [phone] 90 tablet 0   valACYclovir (VALTREX) 500 MG tablet Take 500 mg by mouth daily as needed (for outbreaks).     Semaglutide,0.25 or  0.5MG /DOS, (OZEMPIC, 0.25 OR 0.5 MG/DOSE,) 2 MG/3ML SOPN Inject 0.25mg  SQ once weekly x4 weeks, then increase to 0.5mg  SQ weekly (Patient not taking: Reported on 08/02/2022) 3 mL 1   No current facility-administered medications for this encounter.   No Active Allergies  Social History   Socioeconomic History   Marital status: Married    Spouse name: Not on file   Number of children: Not on file   Years of education: Not on file   Highest education level: Not on file  Occupational History   Not on file  Tobacco Use   Smoking status: Former    Packs/day: 0.50    Years: 30.00    Additional pack years: 0.00    Total pack years: 15.00    Types: Cigarettes    Quit date: 10/29/2006    Years since quitting: 15.7   Smokeless tobacco: Never  Vaping Use   Vaping Use: Never used  Substance and Sexual Activity   Alcohol use: No   Drug use: No   Sexual activity: Not Currently  Other Topics Concern   Not on file  Social History Narrative   Not on file   Social Determinants of Health   Financial Resource Strain: Not on file  Food Insecurity: Not on file  Transportation Needs: Not on file  Physical Activity: Not on file  Stress: Not on file  Social Connections: Not on file  Intimate Partner Violence: Not on file   Family History  Adopted: Yes   BP 100/68   Pulse 88   Wt 134.9 kg (297 lb 6.4 oz)   SpO2 97%   BMI 41.48 kg/m   Wt Readings from Last 3 Encounters:  08/02/22 134.9 kg (297 lb 6.4 oz)  02/11/22 (!) 141.5 kg (312 lb)  02/10/22 (!) 141.5 kg (312 lb)   PHYSICAL EXAM: General: NAD, obese Neck: No JVD, no thyromegaly or thyroid nodule.  Lungs: Clear to auscultation bilaterally with normal respiratory effort. CV: Nondisplaced PMI.  Heart regular S1/S2, no S3/S4, no murmur.  No peripheral edema.  No carotid bruit.  Normal pedal pulses.  Abdomen: Soft, nontender, no hepatosplenomegaly, no distention.  Skin: Intact without lesions or rashes.  Neurologic: Alert and  oriented x 3.  Psych: Normal affect. Extremities: No clubbing or cyanosis.  HEENT: Normal.   ASSESSMENT & PLAN: 1. Chronic Systolic CHF: Nonischemic cardiomyopathy.  Echo (6/21) showed EF 35-40%, [facility] 7/21 demonstrated markedly elevated filling pressures and low CI 1.3. Normal coronaries. Suspect hypertensive cardiomyopathy (markedly hypertensive at time of cath requiring NTG gtt). She had marked improvement rapidly with BP lowering and diuresis. Suspect she had flash pulmonary edema in the cath lab due to hypertensive emergency. Required milrinone to help w/  CO and help w/ diuresis. Echo in 10/21 showed EF up to 50%. Echo today showed EF 55-60%, mild LVH, normal RV.  Stable NYHA class II symptoms.  She is not volume overloaded.  - Off Jardiance due to yeast infections - Continue Entresto 49/51 mg bid.  - Continue Lasix 40 mg daily and KCl 20 daily.  BMET/BNP today, if K is high will need to stop supplementation.   - Continue spironolactone 25 mg daily. - Continue Coreg 6.25 mg bid.  2. HTN: Controlled on current meds.  3. OSA: Continue CPAP. She reports full compliance  4. Obesity: Weight trending down. Body mass index is 41.48 kg/m. - She is having trouble getting semaglutide.  Refer to pharmacy clinic to help out with semaglutide vs tirzepatide.  5. Hyperlipidemia: On atorvastatin, check lipids today.   Follow up in 6 months with APP.   Marca Ancona, MD 08/03/22

## 2022-08-06 ENCOUNTER — Encounter (HOSPITAL_COMMUNITY): Payer: Self-pay

## 2022-08-12 ENCOUNTER — Other Ambulatory Visit (HOSPITAL_COMMUNITY): Payer: Self-pay

## 2022-08-12 ENCOUNTER — Encounter (HOSPITAL_COMMUNITY): Payer: Self-pay | Admitting: Internal Medicine

## 2022-08-16 ENCOUNTER — Other Ambulatory Visit: Payer: Self-pay | Admitting: Cardiology

## 2022-08-16 ENCOUNTER — Telehealth: Payer: Self-pay | Admitting: Pharmacist

## 2022-08-16 MED ORDER — OZEMPIC (0.25 OR 0.5 MG/DOSE) 2 MG/3ML ~~LOC~~ SOPN: PEN_INJECTOR | SUBCUTANEOUS | 1 refills | Status: DC

## 2022-08-16 NOTE — Telephone Encounter (Signed)
Called pt to follow up with her Ozempic. Reports she's been off med since March. Read through old phone note from 4/1 where test claim stated Ozempic couldn't be processed because her insurance wasn't active. Pt states her insurance is the same and that it's still active. I sent in a refill of her Ozempic and advised her to call me if pharmacy cannot process rx. Otherwise, I'll call her in a month for dose titration.

## 2022-08-20 NOTE — Telephone Encounter (Signed)
Pt called clinic stating pharmacy cannot process her Ozempic. I was able to submit a prior authorization so I am unsure why prior note said she did not have active insurance.  Key: [id number].

## 2022-08-24 MED ORDER — OZEMPIC (0.25 OR 0.5 MG/DOSE) 2 MG/3ML ~~LOC~~ SOPN: PEN_INJECTOR | SUBCUTANEOUS | 1 refills | Status: DC

## 2022-08-24 NOTE — Addendum Note (Signed)
Addended by: Mariette Cowley E on: 08/24/2022 09:55 AM   Modules accepted: Orders

## 2022-08-24 NOTE — Telephone Encounter (Signed)
Ozempic PA approved through 08/20/23. Pt aware. I will call her in a month for dose titration.

## 2022-09-03 ENCOUNTER — Ambulatory Visit: Payer: 59

## 2022-09-13 ENCOUNTER — Telehealth: Payer: Self-pay | Admitting: Pharmacist

## 2022-09-13 ENCOUNTER — Other Ambulatory Visit: Payer: Self-pay | Admitting: Obstetrics & Gynecology

## 2022-09-13 DIAGNOSIS — N6310 Unspecified lump in the right breast, unspecified quadrant: Secondary | ICD-10-CM

## 2022-09-13 MED ORDER — OZEMPIC (0.25 OR 0.5 MG/DOSE) 2 MG/3ML ~~LOC~~ SOPN: PEN_INJECTOR | SUBCUTANEOUS | 1 refills | Status: DC

## 2022-09-13 NOTE — Telephone Encounter (Signed)
Patient is calling stating medication needs to be sent to:   [id number] - [place], [place] - [address] AT [facility] OF [address]

## 2022-09-13 NOTE — Addendum Note (Signed)
Addended by: Kailon Treese E on: 09/13/2022 11:51 AM   Modules accepted: Orders

## 2022-09-13 NOTE — Telephone Encounter (Signed)
Rx sent in

## 2022-09-13 NOTE — Telephone Encounter (Signed)
Called pt to follow up with Ozempic. States she never started on med because the pharmacy was out. She did not call to let us know this. Advised her I cannot see what pharmacy has med in stock but if she finds one with Ozempic to let us know and we can send in rx there.

## 2022-10-02 ENCOUNTER — Encounter (HOSPITAL_COMMUNITY): Payer: Self-pay | Admitting: Cardiology

## 2022-10-06 ENCOUNTER — Telehealth: Payer: Self-pay | Admitting: Pharmacist

## 2022-10-06 NOTE — Telephone Encounter (Signed)
Called pt to follow up with Ozempic tolerability, left message. Rx has refill, plan is to increase to 0.5mg  weekly if tolerating 0.25mg  well.

## 2022-10-08 NOTE — Telephone Encounter (Signed)
2nd message left for pt.

## 2022-10-11 NOTE — Telephone Encounter (Signed)
Called pt again, she answered this time. No side effects, already increased her dose to 0.5mg  on Sunday. She is aware to pick up refill and inject 0.5mg  for 1 month, then will continue with titration.

## 2022-11-01 ENCOUNTER — Other Ambulatory Visit (HOSPITAL_COMMUNITY): Payer: Self-pay | Admitting: Cardiology

## 2022-11-02 ENCOUNTER — Telehealth: Payer: Self-pay | Admitting: Pharmacist

## 2022-11-02 NOTE — Telephone Encounter (Signed)
Called pt to follow up with Ozempic tolerability, left message. Will plan to increase dose to 1mg  weekly if able.

## 2022-11-03 NOTE — Telephone Encounter (Signed)
Called pt again. She is tolerating Ozempic well and agreeable to increase her dose. Wants me to wait until Friday to send in refill due to high copay.

## 2022-11-05 MED ORDER — SEMAGLUTIDE (1 MG/DOSE) 4 MG/3ML ~~LOC~~ SOPN: 1.0000 mg | PEN_INJECTOR | SUBCUTANEOUS | 0 refills | Status: DC

## 2022-11-05 NOTE — Addendum Note (Signed)
Addended by: Anabeth Chilcott E on: 11/05/2022 07:36 AM   Modules accepted: Orders

## 2022-12-01 ENCOUNTER — Telehealth: Payer: Self-pay | Admitting: Pharmacist

## 2022-12-01 MED ORDER — OZEMPIC (2 MG/DOSE) 8 MG/3ML ~~LOC~~ SOPN: 2.0000 mg | PEN_INJECTOR | SUBCUTANEOUS | 11 refills | Status: DC

## 2022-12-01 NOTE — Telephone Encounter (Signed)
Called pt to follow up with Ozempic tolerability and potential dose increase. Left message.

## 2022-12-01 NOTE — Telephone Encounter (Signed)
Spoke with pt, reports tolerating Ozempic well, no side effects. She would like to increase her dose to 2mg  weekly. Updated rx sent in.

## 2023-01-21 ENCOUNTER — Telehealth: Payer: Self-pay | Admitting: Pharmacist

## 2023-01-21 NOTE — Telephone Encounter (Signed)
Called pt to inform her that she needed to contact her pharmacy to request a refill on her medication. I advised the pt that if she has any other problems, questions or concerns, to give our office a call back. Pt verbalized understanding.

## 2023-01-21 NOTE — Telephone Encounter (Signed)
Patient provided with 11 refills last month. Needs to contact pharmacy.

## 2023-01-21 NOTE — Telephone Encounter (Signed)
 *  STAT* If patient is at the pharmacy, call can be transferred to refill team.   1. Which medications need to be refilled? (please list name of each medication and dose if known)   Semaglutide, 2 MG/DOSE, (OZEMPIC, 2 MG/DOSE,) 8 MG/3ML SOPN    2. Would you like to learn more about the convenience, safety, & potential cost savings by using the [facility]?    3. Are you open to using the [facility] (Type [facility]).   4. Which pharmacy/location (including street and city if local pharmacy) is medication to be sent to?  [id number] - [place], [place] - [address] AT [facility] OF [address]    5. Do they need a 30 day or 90 day supply?  30

## 2023-02-03 ENCOUNTER — Other Ambulatory Visit (HOSPITAL_COMMUNITY): Payer: Self-pay | Admitting: Cardiology

## 2023-03-01 ENCOUNTER — Other Ambulatory Visit (HOSPITAL_COMMUNITY): Payer: Self-pay | Admitting: Cardiology

## 2023-03-03 ENCOUNTER — Ambulatory Visit
Admission: RE | Admit: 2023-03-03 | Discharge: 2023-03-03 | Disposition: A | Discharge status: Home / Self Care | Payer: Medicare Other | Source: Ambulatory Visit | Attending: Obstetrics & Gynecology | Admitting: Obstetrics & Gynecology

## 2023-03-03 DIAGNOSIS — N6310 Unspecified lump in the right breast, unspecified quadrant: Secondary | ICD-10-CM

## 2023-03-05 ENCOUNTER — Other Ambulatory Visit (HOSPITAL_COMMUNITY): Payer: Self-pay | Admitting: Family Medicine

## 2023-03-26 ENCOUNTER — Other Ambulatory Visit (HOSPITAL_COMMUNITY): Payer: Self-pay | Admitting: Cardiology

## 2023-04-12 ENCOUNTER — Other Ambulatory Visit (HOSPITAL_COMMUNITY): Payer: Self-pay | Admitting: Cardiology

## 2023-04-12 MED ORDER — POTASSIUM CHLORIDE CRYS ER 20 MEQ PO TBCR: 20.0000 meq | EXTENDED_RELEASE_TABLET | Freq: Every day | ORAL | 11 refills | Status: DC

## 2023-04-15 ENCOUNTER — Encounter (HOSPITAL_COMMUNITY): Payer: Self-pay | Admitting: Cardiology

## 2023-04-15 ENCOUNTER — Ambulatory Visit (HOSPITAL_COMMUNITY)
Admission: RE | Admit: 2023-04-15 | Discharge: 2023-04-15 | Disposition: A | Discharge status: Home / Self Care | Payer: Medicare Other | Source: Ambulatory Visit | Attending: Cardiology | Admitting: Cardiology

## 2023-04-15 VITALS — BP 110/78 | HR 99 | Wt 309.0 lb

## 2023-04-15 DIAGNOSIS — Z6841 Body Mass Index (BMI) 40.0 and over, adult: Secondary | ICD-10-CM | POA: Diagnosis not present

## 2023-04-15 DIAGNOSIS — E669 Obesity, unspecified: Secondary | ICD-10-CM | POA: Insufficient documentation

## 2023-04-15 DIAGNOSIS — Z87891 Personal history of nicotine dependence: Secondary | ICD-10-CM | POA: Insufficient documentation

## 2023-04-15 DIAGNOSIS — M1990 Unspecified osteoarthritis, unspecified site: Secondary | ICD-10-CM | POA: Insufficient documentation

## 2023-04-15 DIAGNOSIS — I428 Other cardiomyopathies: Secondary | ICD-10-CM | POA: Insufficient documentation

## 2023-04-15 DIAGNOSIS — Z96653 Presence of artificial knee joint, bilateral: Secondary | ICD-10-CM | POA: Insufficient documentation

## 2023-04-15 DIAGNOSIS — Z79899 Other long term (current) drug therapy: Secondary | ICD-10-CM | POA: Insufficient documentation

## 2023-04-15 DIAGNOSIS — J45909 Unspecified asthma, uncomplicated: Secondary | ICD-10-CM | POA: Insufficient documentation

## 2023-04-15 DIAGNOSIS — Z7985 Long-term (current) use of injectable non-insulin antidiabetic drugs: Secondary | ICD-10-CM | POA: Diagnosis not present

## 2023-04-15 DIAGNOSIS — E785 Hyperlipidemia, unspecified: Secondary | ICD-10-CM | POA: Diagnosis not present

## 2023-04-15 DIAGNOSIS — G4733 Obstructive sleep apnea (adult) (pediatric): Secondary | ICD-10-CM | POA: Insufficient documentation

## 2023-04-15 DIAGNOSIS — R9431 Abnormal electrocardiogram [ECG] [EKG]: Secondary | ICD-10-CM | POA: Diagnosis not present

## 2023-04-15 DIAGNOSIS — E119 Type 2 diabetes mellitus without complications: Secondary | ICD-10-CM | POA: Diagnosis not present

## 2023-04-15 DIAGNOSIS — I5042 Chronic combined systolic (congestive) and diastolic (congestive) heart failure: Secondary | ICD-10-CM | POA: Diagnosis present

## 2023-04-15 DIAGNOSIS — I110 Hypertensive heart disease with heart failure: Secondary | ICD-10-CM | POA: Diagnosis not present

## 2023-04-15 DIAGNOSIS — I5022 Chronic systolic (congestive) heart failure: Secondary | ICD-10-CM | POA: Insufficient documentation

## 2023-04-15 LAB — BASIC METABOLIC PANEL
Anion gap: 13 (ref 5–15)
BUN: 14 mg/dL (ref 8–23)
CO2: 24 mmol/L (ref 22–32)
Calcium: 9.8 mg/dL (ref 8.9–10.3)
Chloride: 98 mmol/L (ref 98–111)
Creatinine, Ser: 1.43 mg/dL — ABNORMAL HIGH (ref 0.44–1.00)
GFR, Estimated: 41 mL/min — ABNORMAL LOW (ref >60)
Glucose, Bld: 103 mg/dL — ABNORMAL HIGH (ref 70–99)
Potassium: 4.2 mmol/L (ref 3.5–5.1)
Sodium: 135 mmol/L (ref 135–145)

## 2023-04-15 NOTE — Patient Instructions (Signed)
There has been no changes to your medications.  Labs done today, your results will be available in MyChart, we will contact you for abnormal readings.  Your physician has requested that you have an echocardiogram. Echocardiography is a painless test that uses sound waves to create images of your heart. It provides your doctor with information about the size and shape of your heart and how well your heart's chambers and valves are working. This procedure takes approximately one hour. There are no restrictions for this procedure. Please do NOT wear cologne, perfume, aftershave, or lotions (deodorant is allowed). Please arrive 15 minutes prior to your appointment time.  Please note: We ask at that you not bring children with you during ultrasound (echo/ vascular) testing. Due to room size and safety concerns, children are not allowed in the ultrasound rooms during exams. Our front office staff cannot provide observation of children in our lobby area while testing is being conducted. An adult accompanying a patient to their appointment will only be allowed in the ultrasound room at the discretion of the ultrasound technician under special circumstances. We apologize for any inconvenience.  Your physician recommends that you schedule a follow-up appointment in: 6 months.  If you have any questions or concerns before your next appointment please send [place] a message through [place] or call our office at [phone].    TO LEAVE A MESSAGE FOR THE NURSE SELECT OPTION 2, PLEASE LEAVE A MESSAGE INCLUDING: YOUR NAME DATE OF BIRTH CALL BACK NUMBER REASON FOR CALL**this is important as we prioritize the call backs  YOU WILL RECEIVE A CALL BACK THE SAME DAY AS LONG AS YOU CALL BEFORE 4:00 PM  At the [facility], you and your health needs are our priority. As part of our continuing mission to provide you with exceptional heart care, we have created designated Provider Care Teams. These Care  Teams include your primary Cardiologist (physician) and Advanced Practice Providers (APPs- Physician Assistants and Nurse Practitioners) who all work together to provide you with the care you need, when you need it.   You may see any of the following providers on your designated Care Team at your next follow up: Dr Arvilla Meres Dr Marca Ancona Dr. Dorthula Nettles Dr. Clearnce Hasten Amy Filbert Schilder, NP Robbie Lis, [place] [facility] [place], [place] Brynda Peon, NP [place] Lee, NP Karle Plumber, PharmD   Please be sure to bring in all your medications bottles to every appointment.    Thank you for choosing [facility]

## 2023-04-17 NOTE — Progress Notes (Addendum)
Advanced Heart Failure Clinic Note   PCP: Elias Else, MD HF Cardiology: Dr. Shirlee Latch   Chief Complaint: CHF  HPI: Alexandria Ruiz is a 68 y.o. with history of asthma, HTN, HLD, DMII, OA,  R TKR & LTKR, OSA and recently diagnosed systolic heart failure.   Evaluated by Dr Jacques Navy for dyspnea in 2021. Had myoview and ECHO that showed reduced EF, 35-40%. She was set up for [facility] on 09/27/19 that showed normal cors, markedly elevated filling pressures, and low cardiac output, CI 1.3. Suspect flash pulmonary edema.  She was hypertensive in the cath lab so nitroglycerin drip was started. She was admitted, placed on milrinone and diuresed w/ IV Lasix. Cause of cardiomyopathy felt to be hypertension. She was diuresed and started on GDMT. Milrinone weaned off w/ stable Co-ox (73%).  Echo in 10/21 showed EF up to 50%, mild LVH, mildly decreased RV systolic function, mild MR.  She had to stop Jardiance due to recurrent yeast infections.   Echo in 5/24 showed EF 55-60%, mild LVH, normal RV.    Patient returns for followup of CHF.  Weight is up 12 lbs. She is having trouble getting semaglutide and is not taking regularly (takes when she can get it).  No significant exertional dyspnea or chest pain.  No lightheadedness. No palpitations. She has a lot of pain in her hamstring area, this is her main complaint.   ECG (personally reviewed): NSR, nonspecific T wave flattening  Labs (7/21): K 4, creatinine 1.09 Labs (9/21): K 4.2, creatinine 1.09 Labs (10/21): K 4, creatinine 1.17 Labs (9/22): K 4.2, creatinine 1.08 Labs (10/22): K 4.6, creatinine 1.04 Labs (12/22): K 4.1, creatinine 1.00 Labs (6/23): K 5.2, creatinine 1.46 Labs (5/24): LDL 101, BNP 11.1, K 4.4, creatinine 1.17  Review of Systems: All systems reviewed and negative except as per HPI.   PMH: 1. Type 2 diabetes.  2. GERD 3. HTN 4. Hyperlipidemia 5. OSA: CPAP.  6. OA with bilateral TKRs.  7. Asthma 8. Chronic systolic CHF: Nonischemic  cardiomyopathy.  - Echo (6/21): EF 35-40%, global HK, moderate LV dilation, RV normal,  - LHC/RHC (7/21): No coronary disease.  Mean RA 35, PA 70/44, mean PCWP 46, CI 1.33.  - Echo (10/21): EF up to 50%, mild LVH, mildly decreased RV systolic function, mild MR. - Echo (5/24): EF 55-60%, mild LVH, normal RV.  9. Chronic LBP.  Current Outpatient Medications  Medication Sig Dispense Refill   ALPRAZolam (XANAX) 0.5 MG tablet Take 0.25 mg by mouth daily. Except sat/sun     atorvastatin (LIPITOR) 20 MG tablet Take 20 mg by mouth 3 (three) times daily.     carvedilol (COREG) 6.25 MG tablet TAKE 1 TABLET BY MOUTH 2 TIMES DAILY WITH A MEAL. 180 tablet 3   cyclobenzaprine (FLEXERIL) 10 MG tablet Take 1 tablet (10 mg total) by mouth 3 (three) times daily as needed for muscle spasms. 30 tablet 1   DULoxetine (CYMBALTA) 60 MG capsule Take 60 mg by mouth 2 (two) times daily.     ENTRESTO 49-51 MG TAKE 1 TABLET BY MOUTH TWICE A DAY 180 tablet 3   estradiol (ESTRACE) 1 MG tablet Take 1 mg by mouth at bedtime.     Eszopiclone 3 MG TABS Take 3 mg by mouth at bedtime. Take immediately before bedtime     furosemide (LASIX) 40 MG tablet TAKE 1 TABLET BY MOUTH EVERY DAY 90 tablet 3   hydrOXYzine (ATARAX) 25 MG tablet Take 25 mg by mouth 2 (  two) times daily as needed for itching.     levocetirizine (XYZAL) 5 MG tablet Take 5 mg by mouth as needed for allergies.     ondansetron (ZOFRAN) 4 MG tablet Take 1 tablet (4 mg total) by mouth every 8 (eight) hours as needed for nausea or vomiting. 10 tablet 0   potassium chloride SA (KLOR-CON M) 20 MEQ tablet Take 1 tablet (20 mEq total) by mouth daily. 30 tablet 11   Semaglutide, 2 MG/DOSE, (OZEMPIC, 2 MG/DOSE,) 8 MG/3ML SOPN Inject 2 mg into the skin once a week. 3 mL 11   spironolactone (ALDACTONE) 25 MG tablet TAKE 1 TABLET (25 MG TOTAL) BY MOUTH DAILY. PLEASE CALL TO SCHEDULE FOLLOW UP APPT [phone] 90 tablet 1   valACYclovir (VALTREX) 500 MG tablet Take 500 mg by  mouth daily as needed (for outbreaks).     No current facility-administered medications for this encounter.   No Active Allergies  Social History   Socioeconomic History   Marital status: Married    Spouse name: Not on file   Number of children: Not on file   Years of education: Not on file   Highest education level: Not on file  Occupational History   Not on file  Tobacco Use   Smoking status: Former    Current packs/day: 0.00    Average packs/day: 0.5 packs/day for 30.0 years (15.0 ttl pk-yrs)    Types: Cigarettes    Start date: 10/28/1976    Quit date: 10/29/2006    Years since quitting: 16.4   Smokeless tobacco: Never  Vaping Use   Vaping status: Never Used  Substance and Sexual Activity   Alcohol use: No   Drug use: No   Sexual activity: Not Currently  Other Topics Concern   Not on file  Social History Narrative   Not on file   Social Drivers of Health   Financial Resource Strain: Not on file  Food Insecurity: Not on file  Transportation Needs: Not on file  Physical Activity: Not on file  Stress: Not on file  Social Connections: Not on file  Intimate Partner Violence: Not on file   Family History  Adopted: Yes   BP 110/78   Pulse 99   Wt (!) 140.2 kg (309 lb)   SpO2 97%   BMI 43.10 kg/m   Wt Readings from Last 3 Encounters:  04/15/23 (!) 140.2 kg (309 lb)  08/02/22 134.9 kg (297 lb 6.4 oz)  02/11/22 (!) 141.5 kg (312 lb)   General: NAD, obese.  Neck: No JVD, no thyromegaly or thyroid nodule.  Lungs: Clear to auscultation bilaterally with normal respiratory effort. CV: Nondisplaced PMI.  Heart regular S1/S2, no S3/S4, no murmur.  No peripheral edema.  No carotid bruit.  Normal pedal pulses.  Abdomen: Soft, nontender, no hepatosplenomegaly, no distention.  Skin: Intact without lesions or rashes.  Neurologic: Alert and oriented x 3.  Psych: Normal affect. Extremities: No clubbing or cyanosis.  HEENT: Normal.   ASSESSMENT & PLAN: 1. Chronic  Systolic CHF: Nonischemic cardiomyopathy.  Echo (6/21) showed EF 35-40%, [facility] 7/21 demonstrated markedly elevated filling pressures and low CI 1.3. Normal coronaries. Suspect hypertensive cardiomyopathy (markedly hypertensive at time of cath requiring NTG gtt). She had marked improvement rapidly with BP lowering and diuresis. Suspect she had flash pulmonary edema in the cath lab due to hypertensive emergency. Required milrinone to help w/ CO and help w/ diuresis. Echo in 10/21 showed EF up to 50%. Echo in 5/24 showed EF  55-60%, mild LVH, normal RV.  NYHA class I, not volume overloaded on exam.  - Off Jardiance due to yeast infections - Continue Entresto 49/51 mg bid.  - Continue Lasix 40 mg daily and KCl 20 daily.  BMET today.  - Continue spironolactone 25 mg daily. - Continue Coreg 6.25 mg bid.  -  I will arrange for echo in 5/25.  If this shows normal EF, will not need further echoes unless there are symptomatic changes.  2. HTN: Controlled on current meds.  3. OSA: Continue CPAP. She reports full compliance  4. Obesity:  Body mass index is 43.1 kg/m. - She is having trouble getting semaglutide.   5. Hyperlipidemia: On atorvastatin.   Follow up in 6 months with APP.   I spent 21 minutes reviewing records, interviewing/examining patient, and managing orders.   Marca Ancona, MD 04/17/23

## 2023-04-17 NOTE — Addendum Note (Signed)
Encounter addended by: Laurey Morale, MD on: 04/17/2023 11:11 PM  Actions taken: Clinical Note Signed

## 2023-07-26 ENCOUNTER — Ambulatory Visit (HOSPITAL_COMMUNITY): Admission: RE | Admit: 2023-07-26 | Payer: Medicare Other | Source: Ambulatory Visit

## 2023-08-31 ENCOUNTER — Other Ambulatory Visit (HOSPITAL_COMMUNITY): Payer: Self-pay | Admitting: Cardiology

## 2023-09-08 ENCOUNTER — Ambulatory Visit (HOSPITAL_COMMUNITY)
Admission: RE | Admit: 2023-09-08 | Discharge: 2023-09-08 | Disposition: A | Discharge status: Home / Self Care | Source: Ambulatory Visit | Attending: Cardiology | Admitting: Cardiology

## 2023-09-08 DIAGNOSIS — E785 Hyperlipidemia, unspecified: Secondary | ICD-10-CM | POA: Diagnosis not present

## 2023-09-08 DIAGNOSIS — E119 Type 2 diabetes mellitus without complications: Secondary | ICD-10-CM | POA: Insufficient documentation

## 2023-09-08 DIAGNOSIS — G4733 Obstructive sleep apnea (adult) (pediatric): Secondary | ICD-10-CM | POA: Diagnosis not present

## 2023-09-08 DIAGNOSIS — I5042 Chronic combined systolic (congestive) and diastolic (congestive) heart failure: Secondary | ICD-10-CM | POA: Diagnosis present

## 2023-09-08 DIAGNOSIS — I110 Hypertensive heart disease with heart failure: Secondary | ICD-10-CM | POA: Diagnosis not present

## 2023-09-08 LAB — ECHOCARDIOGRAM COMPLETE
Area-P 1/2: 4.8 cm2
S' Lateral: 3.4 cm

## 2023-09-08 NOTE — Progress Notes (Signed)
  Echocardiogram 2D Echocardiogram has been performed.  [name], RDCS 09/08/2023, 2:29 PM

## 2023-09-09 ENCOUNTER — Encounter (HOSPITAL_COMMUNITY): Payer: Self-pay | Admitting: Cardiology

## 2023-09-09 ENCOUNTER — Ambulatory Visit (HOSPITAL_COMMUNITY): Payer: Self-pay | Admitting: Cardiology

## 2023-09-13 NOTE — Telephone Encounter (Signed)
 Pt aware, agreeable, and verbalized understanding

## 2023-11-12 ENCOUNTER — Other Ambulatory Visit (HOSPITAL_COMMUNITY): Payer: Self-pay | Admitting: Cardiology

## 2023-12-10 ENCOUNTER — Other Ambulatory Visit: Payer: Self-pay | Admitting: Cardiology

## 2024-01-05 ENCOUNTER — Ambulatory Visit (HOSPITAL_COMMUNITY): Payer: Self-pay | Admitting: Cardiology

## 2024-01-05 ENCOUNTER — Ambulatory Visit (HOSPITAL_COMMUNITY)
Admission: RE | Admit: 2024-01-05 | Discharge: 2024-01-05 | Disposition: A | Discharge status: Home / Self Care | Source: Ambulatory Visit | Attending: Cardiology | Admitting: Cardiology

## 2024-01-05 VITALS — BP 106/70 | HR 97 | Wt 301.0 lb

## 2024-01-05 DIAGNOSIS — I5042 Chronic combined systolic (congestive) and diastolic (congestive) heart failure: Secondary | ICD-10-CM

## 2024-01-05 DIAGNOSIS — Z6841 Body Mass Index (BMI) 40.0 and over, adult: Secondary | ICD-10-CM | POA: Diagnosis not present

## 2024-01-05 DIAGNOSIS — M1990 Unspecified osteoarthritis, unspecified site: Secondary | ICD-10-CM | POA: Diagnosis not present

## 2024-01-05 DIAGNOSIS — Z7984 Long term (current) use of oral hypoglycemic drugs: Secondary | ICD-10-CM | POA: Insufficient documentation

## 2024-01-05 DIAGNOSIS — Z96653 Presence of artificial knee joint, bilateral: Secondary | ICD-10-CM | POA: Diagnosis not present

## 2024-01-05 DIAGNOSIS — Z79899 Other long term (current) drug therapy: Secondary | ICD-10-CM | POA: Insufficient documentation

## 2024-01-05 DIAGNOSIS — I110 Hypertensive heart disease with heart failure: Secondary | ICD-10-CM | POA: Insufficient documentation

## 2024-01-05 DIAGNOSIS — E119 Type 2 diabetes mellitus without complications: Secondary | ICD-10-CM | POA: Diagnosis not present

## 2024-01-05 DIAGNOSIS — E782 Mixed hyperlipidemia: Secondary | ICD-10-CM | POA: Diagnosis not present

## 2024-01-05 DIAGNOSIS — R9431 Abnormal electrocardiogram [ECG] [EKG]: Secondary | ICD-10-CM | POA: Diagnosis not present

## 2024-01-05 DIAGNOSIS — E669 Obesity, unspecified: Secondary | ICD-10-CM | POA: Diagnosis not present

## 2024-01-05 DIAGNOSIS — J45909 Unspecified asthma, uncomplicated: Secondary | ICD-10-CM | POA: Diagnosis not present

## 2024-01-05 DIAGNOSIS — G4733 Obstructive sleep apnea (adult) (pediatric): Secondary | ICD-10-CM | POA: Insufficient documentation

## 2024-01-05 DIAGNOSIS — I5022 Chronic systolic (congestive) heart failure: Secondary | ICD-10-CM | POA: Insufficient documentation

## 2024-01-05 DIAGNOSIS — I428 Other cardiomyopathies: Secondary | ICD-10-CM | POA: Insufficient documentation

## 2024-01-05 LAB — BASIC METABOLIC PANEL WITH GFR
Anion gap: 14 (ref 5–15)
BUN: 18 mg/dL (ref 8–23)
CO2: 22 mmol/L (ref 22–32)
Calcium: 9.5 mg/dL (ref 8.9–10.3)
Chloride: 98 mmol/L (ref 98–111)
Creatinine, Ser: 1.71 mg/dL — ABNORMAL HIGH (ref 0.44–1.00)
GFR, Estimated: 33 mL/min — ABNORMAL LOW (ref >60)
Glucose, Bld: 106 mg/dL — ABNORMAL HIGH (ref 70–99)
Potassium: 4.1 mmol/L (ref 3.5–5.1)
Sodium: 134 mmol/L — ABNORMAL LOW (ref 135–145)

## 2024-01-05 LAB — LIPID PANEL
Cholesterol: 187 mg/dL (ref 0–200)
HDL: 52 mg/dL (ref >40)
LDL Cholesterol: 96 mg/dL (ref 0–99)
Total CHOL/HDL Ratio: 3.6 ratio
Triglycerides: 193 mg/dL — ABNORMAL HIGH (ref <150)
VLDL: 39 mg/dL (ref 0–40)

## 2024-01-05 MED ORDER — FUROSEMIDE 40 MG PO TABS: 20.0000 mg | ORAL_TABLET | Freq: Every day | ORAL | Status: DC

## 2024-01-05 MED ORDER — POTASSIUM CHLORIDE CRYS ER 20 MEQ PO TBCR: 10.0000 meq | EXTENDED_RELEASE_TABLET | Freq: Every day | ORAL | Status: DC

## 2024-01-05 NOTE — Patient Instructions (Signed)
 Medication Changes:  DECREASE Furosemide  to 20 mg (1/2 tab) Daily  DECREASE Potassium to 10 meq (1/2 tab) Daily  Lab Work:  Labs done today, your results will be available in MyChart, we will contact you for abnormal readings.  Special Instructions // Education:  Do the following things EVERYDAY: Weigh yourself in the morning before breakfast. Write it down and keep it in a log. Take your medicines as prescribed Eat low salt foods--Limit salt (sodium) to 2000 mg per day.  Stay as active as you can everyday Limit all fluids for the day to less than 2 liters   Follow-Up in: 1 year (October 2025), **PLEASE CALL OUR OFFICE IN AUGUST TO SCHEDULE THIS APPOINTMENT   At the Advanced [facility] Failure Clinic, you and your health needs are our priority. We have a designated team specialized in the treatment of Heart Failure. This Care Team includes your primary Heart Failure Specialized Cardiologist (physician), Advanced Practice Providers (APPs- Physician Assistants and Nurse Practitioners), and Pharmacist who all work together to provide you with the care you need, when you need it.   You may see any of the following providers on your designated Care Team at your next follow up:  Dr. Toribio Fuel Dr. Ezra Shuck Dr. Ria Commander Dr. Odis Brownie Greig Mosses, NP Caffie Shed, [place] [facility] [place], [place] Beckey Coe, NP [place] Lee, NP Tinnie Redman, PharmD   Please be sure to bring in all your medications bottles to every appointment.   Need to Contact Us :  If you have any questions or concerns before your next appointment please send us  a message through [facility] or call our office at [phone].    TO LEAVE A MESSAGE FOR THE NURSE SELECT OPTION 2, PLEASE LEAVE A MESSAGE INCLUDING: YOUR NAME DATE OF BIRTH CALL BACK NUMBER REASON FOR CALL**this is important as we prioritize the call backs  YOU WILL RECEIVE A CALL BACK THE SAME DAY AS LONG AS YOU CALL BEFORE  4:00 PM

## 2024-01-08 NOTE — Progress Notes (Signed)
 Advanced Heart Failure Clinic Note   PCP: Alexandria Charleston, MD HF Cardiology: Dr. Rolan   Chief Complaint: CHF  HPI: [name] is a 68 y.o. with history of asthma, HTN, HLD, DMII, OA,  R TKR & LTKR, OSA and recently diagnosed systolic heart failure.   Evaluated by Dr Alexandria Ruiz for dyspnea in 2021. Had myoview  and ECHO that showed reduced EF, 35-40%. She was set up for [facility] on 09/27/19 that showed normal cors, markedly elevated filling pressures, and low cardiac output, CI 1.3. Suspect flash pulmonary edema.  She was hypertensive in the cath lab so nitroglycerin  drip was started. She was admitted, placed on milrinone  and diuresed w/ IV Lasix . Cause of cardiomyopathy felt to be hypertension. She was diuresed and started on GDMT. Milrinone  weaned off w/ stable Co-ox (73%).  Echo in 10/21 showed EF up to 50%, mild LVH, mildly decreased RV systolic function, mild MR.  She had to stop Jardiance  due to recurrent yeast infections.   Echo in 5/24 showed EF 55-60%, mild LVH, normal RV.   Echo in 6/25 showed EF 60-65%, mild LVH, normal RV size and systolic function.   Patient returns for followup of CHF.  Weight is down 8 lbs.  She is on semaglutide .  She has chronic DOE walking up stairs or inclines, no changes.  No chest pain.  No orthopnea/PND.  No lightheadedness or palpitations.    ECG (personally reviewed): NSR, nonspecific T wave flattening  Labs (5/24): LDL 101, BNP 11.1, K 4.4, creatinine 1.17 Labs (1/25): K 4.2, creatinine 1.43  Review of Systems: All systems reviewed and negative except as per HPI.   PMH: 1. Type 2 diabetes.  2. GERD 3. HTN 4. Hyperlipidemia 5. OSA: CPAP.  6. OA with bilateral TKRs.  7. Asthma 8. Chronic systolic CHF: Nonischemic cardiomyopathy.  - Echo (6/21): EF 35-40%, global HK, moderate LV dilation, RV normal,  - LHC/RHC (7/21): No coronary disease.  Mean RA 35, PA 70/44, mean PCWP 46, CI 1.33.  - Echo (10/21): EF up to 50%, mild LVH, mildly decreased RV  systolic function, mild MR. - Echo (5/24): EF 55-60%, mild LVH, normal RV.  - Echo (6/25): EF 60-65%, mild LVH, normal RV size and systolic function.  9. Chronic LBP.  Current Outpatient Medications  Medication Sig Dispense Refill   ALPRAZolam (XANAX) 0.5 MG tablet Take 0.25 mg by mouth daily. Except sat/sun     atorvastatin  (LIPITOR) 20 MG tablet Take 20 mg by mouth 3 (three) times daily.     carvedilol  (COREG ) 6.25 MG tablet TAKE 1 TABLET BY MOUTH 2 TIMES DAILY WITH A MEAL. 180 tablet 3   DULoxetine  (CYMBALTA ) 60 MG capsule Take 60 mg by mouth 2 (two) times daily.     ENTRESTO  49-51 MG TAKE 1 TABLET BY MOUTH TWICE A DAY 180 tablet 3   estradiol  (ESTRACE ) 1 MG tablet Take 1 mg by mouth at bedtime.     Eszopiclone 3 MG TABS Take 3 mg by mouth at bedtime. Take immediately before bedtime     hydrOXYzine  (ATARAX ) 25 MG tablet Take 25 mg by mouth 2 (two) times daily as needed for itching.     levocetirizine (XYZAL) 5 MG tablet Take 5 mg by mouth as needed for allergies.     ondansetron  (ZOFRAN ) 4 MG tablet Take 1 tablet (4 mg total) by mouth every 8 (eight) hours as needed for nausea or vomiting. 10 tablet 0   Semaglutide , 2 MG/DOSE, (OZEMPIC , 2 MG/DOSE,) 8 MG/3ML SOPN  Inject 2 mg into the skin once a week. PLEASE SCHEDULE APPOINTMENT FOR MORE REFILLS 3 mL 11   spironolactone  (ALDACTONE ) 25 MG tablet TAKE 1 TABLET (25 MG TOTAL) BY MOUTH DAILY. PLEASE CALL TO SCHEDULE FOLLOW UP APPT [phone] 90 tablet 1   valACYclovir  (VALTREX ) 500 MG tablet Take 500 mg by mouth daily as needed (for outbreaks).     cyclobenzaprine  (FLEXERIL ) 10 MG tablet Take 1 tablet (10 mg total) by mouth 3 (three) times daily as needed for muscle spasms. 30 tablet 1   furosemide  (LASIX ) 40 MG tablet Take 0.5 tablets (20 mg total) by mouth daily.     potassium chloride  SA (KLOR-CON  M) 20 MEQ tablet Take 0.5 tablets (10 mEq total) by mouth daily.     No current facility-administered medications for this encounter.   No  Active Allergies  Social History   Socioeconomic History   Marital status: Married    Spouse name: Not on file   Number of children: Not on file   Years of education: Not on file   Highest education level: Not on file  Occupational History   Not on file  Tobacco Use   Smoking status: Former    Current packs/day: 0.00    Average packs/day: 0.5 packs/day for 30.0 years (15.0 ttl pk-yrs)    Types: Cigarettes    Start date: 10/28/1976    Quit date: 10/29/2006    Years since quitting: 17.2   Smokeless tobacco: Never  Vaping Use   Vaping status: Never Used  Substance and Sexual Activity   Alcohol use: No   Drug use: No   Sexual activity: Not Currently  Other Topics Concern   Not on file  Social History Narrative   Not on file   Social Drivers of Health   Financial Resource Strain: Not on file  Food Insecurity: Not on file  Transportation Needs: Not on file  Physical Activity: Not on file  Stress: Not on file  Social Connections: Not on file  Intimate Partner Violence: Not on file   Family History  Adopted: Yes   BP 106/70   Pulse 97   Wt (!) 136.5 kg (301 lb)   SpO2 98%   BMI 41.98 kg/m   Wt Readings from Last 3 Encounters:  01/05/24 (!) 136.5 kg (301 lb)  04/15/23 (!) 140.2 kg (309 lb)  08/02/22 134.9 kg (297 lb 6.4 oz)   General: NAD Neck: No JVD, no thyromegaly or thyroid nodule.  Lungs: Clear to auscultation bilaterally with normal respiratory effort. CV: Nondisplaced PMI.  Heart regular S1/S2, no S3/S4, no murmur.  No peripheral edema.  No carotid bruit.  Normal pedal pulses.  Abdomen: Soft, nontender, no hepatosplenomegaly, no distention.  Skin: Intact without lesions or rashes.  Neurologic: Alert and oriented x 3.  Psych: Normal affect. Extremities: No clubbing or cyanosis.  HEENT: Normal.   ASSESSMENT & PLAN: 1. Chronic HF with improved EF: Nonischemic cardiomyopathy.  Echo (6/21) showed EF 35-40%, [facility] 7/21 demonstrated markedly elevated filling  pressures and low CI 1.3. Normal coronaries. Suspect hypertensive cardiomyopathy (markedly hypertensive at time of cath requiring NTG gtt). She had marked improvement rapidly with BP lowering and diuresis. Suspect she had flash pulmonary edema in the cath lab due to hypertensive emergency. Required milrinone  to help w/ CO and help w/ diuresis. Echo in 10/21 showed EF up to 50%. Echo in 5/24 showed EF 55-60%, mild LVH, normal RV.  Echo in 6/25 showed EF 60-65%, mild LVH, normal RV  size and systolic function.  NYHA class II symptoms, not volume overloaded on exam.  Suspect mild dyspnea is due to obesity/deconditioning.  - Off Jardiance  due to yeast infections - Continue Entresto  49/51 mg bid.  - Decrease Lasix  to 20 mg daily and KCl to 10 daily.  Likely can stop eventually.  BMET today.  - Continue spironolactone  25 mg daily. - Continue Coreg  6.25 mg bid.  2. HTN: Controlled on current meds.  3. OSA: Continue CPAP. She reports full compliance  4. Obesity:  Body mass index is 41.98 kg/m. - She has lost some weight with semaglutide .    5. Hyperlipidemia: On atorvastatin .  - Check lipids.   Follow up in 1 year with APP.   I spent 21 minutes reviewing records, interviewing/examining patient, and managing orders.   Ezra Shuck, MD 01/08/24

## 2024-01-19 ENCOUNTER — Ambulatory Visit (HOSPITAL_COMMUNITY)

## 2024-01-19 ENCOUNTER — Encounter (HOSPITAL_COMMUNITY): Payer: Self-pay | Admitting: Cardiology

## 2024-01-23 ENCOUNTER — Ambulatory Visit (HOSPITAL_COMMUNITY)
Admission: RE | Admit: 2024-01-23 | Discharge: 2024-01-23 | Disposition: A | Discharge status: Home / Self Care | Source: Ambulatory Visit | Attending: Internal Medicine | Admitting: Internal Medicine

## 2024-01-23 DIAGNOSIS — I5042 Chronic combined systolic (congestive) and diastolic (congestive) heart failure: Secondary | ICD-10-CM | POA: Diagnosis present

## 2024-01-23 LAB — LIPID PANEL
Cholesterol: 197 mg/dL (ref 0–200)
HDL: 57 mg/dL (ref >40)
LDL Cholesterol: 115 mg/dL — ABNORMAL HIGH (ref 0–99)
Total CHOL/HDL Ratio: 3.5 ratio
Triglycerides: 127 mg/dL (ref <150)
VLDL: 25 mg/dL (ref 0–40)

## 2024-01-23 LAB — BASIC METABOLIC PANEL WITH GFR
Anion gap: 12 (ref 5–15)
BUN: 10 mg/dL (ref 8–23)
CO2: 21 mmol/L — ABNORMAL LOW (ref 22–32)
Calcium: 9.4 mg/dL (ref 8.9–10.3)
Chloride: 105 mmol/L (ref 98–111)
Creatinine, Ser: 1.27 mg/dL — ABNORMAL HIGH (ref 0.44–1.00)
GFR, Estimated: 47 mL/min — ABNORMAL LOW (ref >60)
Glucose, Bld: 108 mg/dL — ABNORMAL HIGH (ref 70–99)
Potassium: 4.7 mmol/L (ref 3.5–5.1)
Sodium: 138 mmol/L (ref 135–145)

## 2024-01-24 ENCOUNTER — Encounter (HOSPITAL_COMMUNITY): Payer: Self-pay | Admitting: Cardiology

## 2024-01-24 ENCOUNTER — Ambulatory Visit (HOSPITAL_COMMUNITY): Payer: Self-pay | Admitting: Cardiology

## 2024-01-24 ENCOUNTER — Ambulatory Visit (HOSPITAL_COMMUNITY)

## 2024-01-24 NOTE — Telephone Encounter (Signed)
 Message sent via [employer]

## 2024-01-30 ENCOUNTER — Ambulatory Visit (HOSPITAL_COMMUNITY)

## 2024-02-16 ENCOUNTER — Telehealth (HOSPITAL_COMMUNITY): Payer: Self-pay | Admitting: Cardiology

## 2024-02-16 DIAGNOSIS — E782 Mixed hyperlipidemia: Secondary | ICD-10-CM

## 2024-02-16 NOTE — Telephone Encounter (Signed)
 Ok to refer to pharmacy clinic for Repatha.

## 2024-02-16 NOTE — Telephone Encounter (Signed)
 [facility] PCP office called to report, during visit pt stated itching started once she increased atorvastatin  to 80 mg, pt reported she stopped atorvastatin  x 1 week and itching stopped, restated med and itching returned.  PCP wanted to notify provider of reaction and see if cardiology would consider changing to repatha

## 2024-02-20 NOTE — Telephone Encounter (Signed)
 Referral placed.

## 2024-03-11 ENCOUNTER — Other Ambulatory Visit (HOSPITAL_COMMUNITY): Payer: Self-pay | Admitting: Cardiology

## 2024-03-30 ENCOUNTER — Other Ambulatory Visit (HOSPITAL_COMMUNITY): Payer: Self-pay | Admitting: Cardiology

## 2024-04-04 ENCOUNTER — Other Ambulatory Visit (HOSPITAL_COMMUNITY): Payer: Self-pay | Admitting: Cardiology
